# Patient Record
Sex: Female | Born: 1935 | Race: White | Hispanic: No | Smoking: Never smoker
Health system: Southern US, Community
[De-identification: ages and names within clinical notes are randomized; demographics above are authoritative.]

## PROBLEM LIST (undated history)

## (undated) DIAGNOSIS — I4891 Unspecified atrial fibrillation: Secondary | ICD-10-CM

## (undated) DIAGNOSIS — F419 Anxiety disorder, unspecified: Secondary | ICD-10-CM

## (undated) DIAGNOSIS — E785 Hyperlipidemia, unspecified: Secondary | ICD-10-CM

## (undated) DIAGNOSIS — F0390 Unspecified dementia without behavioral disturbance: Secondary | ICD-10-CM

## (undated) DIAGNOSIS — I10 Essential (primary) hypertension: Secondary | ICD-10-CM

## (undated) DIAGNOSIS — J45909 Unspecified asthma, uncomplicated: Secondary | ICD-10-CM

## (undated) HISTORY — PX: BACK SURGERY: SHX140

## (undated) HISTORY — PX: JOINT REPLACEMENT: SHX530

## (undated) HISTORY — PX: ABDOMINAL HYSTERECTOMY: SHX81

---

## 2018-10-25 ENCOUNTER — Inpatient Hospital Stay (HOSPITAL_COMMUNITY)
Admission: EM | Admit: 2018-10-25 | Discharge: 2018-11-01 | DRG: 291 | Disposition: A | Discharge status: Nursing Home (Includes ICF) | Payer: Medicare Other | Attending: Internal Medicine | Admitting: Internal Medicine

## 2018-10-25 ENCOUNTER — Encounter (HOSPITAL_COMMUNITY): Payer: Self-pay

## 2018-10-25 ENCOUNTER — Emergency Department (HOSPITAL_COMMUNITY): Payer: Medicare Other

## 2018-10-25 ENCOUNTER — Other Ambulatory Visit: Payer: Self-pay

## 2018-10-25 ENCOUNTER — Observation Stay (HOSPITAL_BASED_OUTPATIENT_CLINIC_OR_DEPARTMENT_OTHER): Payer: Medicare Other

## 2018-10-25 DIAGNOSIS — R0602 Shortness of breath: Secondary | ICD-10-CM

## 2018-10-25 DIAGNOSIS — J9811 Atelectasis: Secondary | ICD-10-CM | POA: Diagnosis present

## 2018-10-25 DIAGNOSIS — I4819 Other persistent atrial fibrillation: Secondary | ICD-10-CM | POA: Diagnosis present

## 2018-10-25 DIAGNOSIS — I272 Pulmonary hypertension, unspecified: Secondary | ICD-10-CM | POA: Diagnosis present

## 2018-10-25 DIAGNOSIS — Z7901 Long term (current) use of anticoagulants: Secondary | ICD-10-CM

## 2018-10-25 DIAGNOSIS — E871 Hypo-osmolality and hyponatremia: Secondary | ICD-10-CM | POA: Diagnosis present

## 2018-10-25 DIAGNOSIS — I429 Cardiomyopathy, unspecified: Secondary | ICD-10-CM | POA: Diagnosis present

## 2018-10-25 DIAGNOSIS — N183 Chronic kidney disease, stage 3 unspecified: Secondary | ICD-10-CM

## 2018-10-25 DIAGNOSIS — N179 Acute kidney failure, unspecified: Secondary | ICD-10-CM | POA: Diagnosis present

## 2018-10-25 DIAGNOSIS — Z66 Do not resuscitate: Secondary | ICD-10-CM | POA: Diagnosis present

## 2018-10-25 DIAGNOSIS — I110 Hypertensive heart disease with heart failure: Principal | ICD-10-CM | POA: Diagnosis present

## 2018-10-25 DIAGNOSIS — Z6832 Body mass index (BMI) 32.0-32.9, adult: Secondary | ICD-10-CM

## 2018-10-25 DIAGNOSIS — I350 Nonrheumatic aortic (valve) stenosis: Secondary | ICD-10-CM | POA: Diagnosis not present

## 2018-10-25 DIAGNOSIS — I5043 Acute on chronic combined systolic (congestive) and diastolic (congestive) heart failure: Secondary | ICD-10-CM | POA: Diagnosis present

## 2018-10-25 DIAGNOSIS — I252 Old myocardial infarction: Secondary | ICD-10-CM

## 2018-10-25 DIAGNOSIS — I361 Nonrheumatic tricuspid (valve) insufficiency: Secondary | ICD-10-CM

## 2018-10-25 DIAGNOSIS — Z90710 Acquired absence of both cervix and uterus: Secondary | ICD-10-CM

## 2018-10-25 DIAGNOSIS — E785 Hyperlipidemia, unspecified: Secondary | ICD-10-CM | POA: Diagnosis present

## 2018-10-25 DIAGNOSIS — Z79899 Other long term (current) drug therapy: Secondary | ICD-10-CM

## 2018-10-25 DIAGNOSIS — I509 Heart failure, unspecified: Secondary | ICD-10-CM | POA: Diagnosis not present

## 2018-10-25 DIAGNOSIS — Z888 Allergy status to other drugs, medicaments and biological substances status: Secondary | ICD-10-CM

## 2018-10-25 DIAGNOSIS — F0390 Unspecified dementia without behavioral disturbance: Secondary | ICD-10-CM

## 2018-10-25 DIAGNOSIS — I480 Paroxysmal atrial fibrillation: Secondary | ICD-10-CM

## 2018-10-25 DIAGNOSIS — N390 Urinary tract infection, site not specified: Secondary | ICD-10-CM | POA: Diagnosis present

## 2018-10-25 DIAGNOSIS — J9601 Acute respiratory failure with hypoxia: Secondary | ICD-10-CM | POA: Diagnosis present

## 2018-10-25 DIAGNOSIS — I10 Essential (primary) hypertension: Secondary | ICD-10-CM

## 2018-10-25 DIAGNOSIS — I4891 Unspecified atrial fibrillation: Secondary | ICD-10-CM

## 2018-10-25 DIAGNOSIS — Z8249 Family history of ischemic heart disease and other diseases of the circulatory system: Secondary | ICD-10-CM

## 2018-10-25 DIAGNOSIS — E669 Obesity, unspecified: Secondary | ICD-10-CM | POA: Diagnosis present

## 2018-10-25 DIAGNOSIS — B9620 Unspecified Escherichia coli [E. coli] as the cause of diseases classified elsewhere: Secondary | ICD-10-CM | POA: Diagnosis present

## 2018-10-25 DIAGNOSIS — I083 Combined rheumatic disorders of mitral, aortic and tricuspid valves: Secondary | ICD-10-CM | POA: Diagnosis present

## 2018-10-25 HISTORY — DX: Unspecified atrial fibrillation: I48.91

## 2018-10-25 HISTORY — DX: Anxiety disorder, unspecified: F41.9

## 2018-10-25 HISTORY — DX: Essential (primary) hypertension: I10

## 2018-10-25 HISTORY — DX: Unspecified dementia, unspecified severity, without behavioral disturbance, psychotic disturbance, mood disturbance, and anxiety: F03.90

## 2018-10-25 HISTORY — DX: Hyperlipidemia, unspecified: E78.5

## 2018-10-25 LAB — ECHOCARDIOGRAM COMPLETE
Height: 59 in
WEIGHTICAEL: 2536.17 [oz_av]

## 2018-10-25 LAB — COMPREHENSIVE METABOLIC PANEL
ALBUMIN: 3.6 g/dL (ref 3.5–5.0)
ALT: 30 U/L (ref 0–44)
AST: 32 U/L (ref 15–41)
Alkaline Phosphatase: 162 U/L — ABNORMAL HIGH (ref 38–126)
Anion gap: 12 (ref 5–15)
BILIRUBIN TOTAL: 1 mg/dL (ref 0.3–1.2)
BUN: 16 mg/dL (ref 8–23)
CHLORIDE: 98 mmol/L (ref 98–111)
CO2: 22 mmol/L (ref 22–32)
Calcium: 9 mg/dL (ref 8.9–10.3)
Creatinine, Ser: 1.01 mg/dL — ABNORMAL HIGH (ref 0.44–1.00)
GFR calc Af Amer: 57 mL/min — ABNORMAL LOW (ref >60)
GFR calc non Af Amer: 49 mL/min — ABNORMAL LOW (ref >60)
GLUCOSE: 109 mg/dL — AB (ref 70–99)
POTASSIUM: 4.5 mmol/L (ref 3.5–5.1)
SODIUM: 132 mmol/L — AB (ref 135–145)
Total Protein: 6.6 g/dL (ref 6.5–8.1)

## 2018-10-25 LAB — CBC WITH DIFFERENTIAL/PLATELET
Abs Immature Granulocytes: 0.05 10*3/uL (ref 0.00–0.07)
BASOS ABS: 0.1 10*3/uL (ref 0.0–0.1)
Basophils Relative: 1 %
EOS PCT: 1 %
Eosinophils Absolute: 0.1 10*3/uL (ref 0.0–0.5)
HEMATOCRIT: 38.5 % (ref 36.0–46.0)
Hemoglobin: 12.2 g/dL (ref 12.0–15.0)
IMMATURE GRANULOCYTES: 0 %
Lymphocytes Relative: 13 %
Lymphs Abs: 1.6 10*3/uL (ref 0.7–4.0)
MCH: 28.7 pg (ref 26.0–34.0)
MCHC: 31.7 g/dL (ref 30.0–36.0)
MCV: 90.6 fL (ref 80.0–100.0)
MONO ABS: 1 10*3/uL (ref 0.1–1.0)
MONOS PCT: 8 %
Neutro Abs: 9.9 10*3/uL — ABNORMAL HIGH (ref 1.7–7.7)
Neutrophils Relative %: 77 %
PLATELETS: 350 10*3/uL (ref 150–400)
RBC: 4.25 MIL/uL (ref 3.87–5.11)
RDW: 16.6 % — AB (ref 11.5–15.5)
WBC: 12.7 10*3/uL — ABNORMAL HIGH (ref 4.0–10.5)
nRBC: 0 % (ref 0.0–0.2)

## 2018-10-25 LAB — URINALYSIS, ROUTINE W REFLEX MICROSCOPIC
Bilirubin Urine: NEGATIVE
Glucose, UA: NEGATIVE mg/dL
KETONES UR: NEGATIVE mg/dL
NITRITE: POSITIVE — AB
Protein, ur: NEGATIVE mg/dL
Specific Gravity, Urine: 1.009 (ref 1.005–1.030)
pH: 5 (ref 5.0–8.0)

## 2018-10-25 LAB — MRSA PCR SCREENING: MRSA by PCR: NEGATIVE

## 2018-10-25 LAB — BRAIN NATRIURETIC PEPTIDE: B NATRIURETIC PEPTIDE 5: 1010.4 pg/mL — AB (ref 0.0–100.0)

## 2018-10-25 LAB — I-STAT TROPONIN, ED: Troponin i, poc: 0 ng/mL (ref 0.00–0.08)

## 2018-10-25 LAB — TROPONIN I: Troponin I: <0.03 ng/mL (ref <0.03)

## 2018-10-25 MED ORDER — METOPROLOL TARTRATE 5 MG/5ML IV SOLN: 5.0000 mg | Freq: Once | INTRAVENOUS | Status: DC

## 2018-10-25 MED ORDER — DILTIAZEM HCL-DEXTROSE 100-5 MG/100ML-% IV SOLN (PREMIX)
5.0000 mg/h | INTRAVENOUS | Status: DC
  Administered 2018-10-25: 5 mg/h via INTRAVENOUS
  Administered 2018-10-26: 10 mg/h via INTRAVENOUS
  Filled 2018-10-25 (×4): qty 100

## 2018-10-25 MED ORDER — SODIUM CHLORIDE 0.9 % IV SOLN: 250.0000 mL | INTRAVENOUS | Status: DC | PRN

## 2018-10-25 MED ORDER — SODIUM CHLORIDE 0.9% FLUSH: 3.0000 mL | INTRAVENOUS | Status: DC | PRN

## 2018-10-25 MED ORDER — SODIUM CHLORIDE 0.9% FLUSH
3.0000 mL | Freq: Two times a day (BID) | INTRAVENOUS | Status: DC
  Administered 2018-10-25 – 2018-11-01 (×15): 3 mL via INTRAVENOUS

## 2018-10-25 MED ORDER — TRAMADOL HCL 50 MG PO TABS
50.0000 mg | ORAL_TABLET | Freq: Two times a day (BID) | ORAL | Status: DC | PRN
  Administered 2018-10-25 – 2018-10-31 (×4): 50 mg via ORAL
  Filled 2018-10-25 (×4): qty 1

## 2018-10-25 MED ORDER — APIXABAN 5 MG PO TABS
5.0000 mg | ORAL_TABLET | Freq: Two times a day (BID) | ORAL | Status: DC
  Administered 2018-10-25 – 2018-11-01 (×15): 5 mg via ORAL
  Filled 2018-10-25 (×15): qty 1

## 2018-10-25 MED ORDER — ONDANSETRON HCL 4 MG/2ML IJ SOLN: 4.0000 mg | Freq: Four times a day (QID) | INTRAMUSCULAR | Status: DC | PRN

## 2018-10-25 MED ORDER — METOPROLOL TARTRATE 5 MG/5ML IV SOLN
2.5000 mg | INTRAVENOUS | Status: DC | PRN
  Filled 2018-10-25: qty 5

## 2018-10-25 MED ORDER — HYDRALAZINE HCL 20 MG/ML IJ SOLN
5.0000 mg | Freq: Four times a day (QID) | INTRAMUSCULAR | Status: DC | PRN
  Administered 2018-10-25: 5 mg via INTRAVENOUS
  Filled 2018-10-25: qty 1

## 2018-10-25 MED ORDER — METOPROLOL TARTRATE 5 MG/5ML IV SOLN
5.0000 mg | Freq: Once | INTRAVENOUS | Status: AC
  Administered 2018-10-25: 5 mg via INTRAVENOUS
  Filled 2018-10-25: qty 5

## 2018-10-25 MED ORDER — DRONEDARONE HCL 400 MG PO TABS
400.0000 mg | ORAL_TABLET | Freq: Two times a day (BID) | ORAL | Status: DC
  Administered 2018-10-25 – 2018-10-26 (×2): 400 mg via ORAL
  Filled 2018-10-25 (×2): qty 1

## 2018-10-25 MED ORDER — FUROSEMIDE 10 MG/ML IJ SOLN
40.0000 mg | Freq: Once | INTRAMUSCULAR | Status: AC
  Administered 2018-10-25: 40 mg via INTRAVENOUS
  Filled 2018-10-25: qty 4

## 2018-10-25 MED ORDER — GABAPENTIN 100 MG PO CAPS
100.0000 mg | ORAL_CAPSULE | Freq: Two times a day (BID) | ORAL | Status: DC
  Administered 2018-10-25 – 2018-11-01 (×14): 100 mg via ORAL
  Filled 2018-10-25 (×15): qty 1

## 2018-10-25 MED ORDER — SODIUM CHLORIDE 0.9 % IV SOLN
1.0000 g | INTRAVENOUS | Status: DC
  Administered 2018-10-25 – 2018-10-26 (×2): 1 g via INTRAVENOUS
  Filled 2018-10-25 (×2): qty 1

## 2018-10-25 MED ORDER — ALBUTEROL SULFATE (2.5 MG/3ML) 0.083% IN NEBU
5.0000 mg | INHALATION_SOLUTION | Freq: Once | RESPIRATORY_TRACT | Status: AC
  Administered 2018-10-25: 5 mg via RESPIRATORY_TRACT
  Filled 2018-10-25: qty 6

## 2018-10-25 MED ORDER — ACETAMINOPHEN 325 MG PO TABS
650.0000 mg | ORAL_TABLET | Freq: Four times a day (QID) | ORAL | Status: DC | PRN
  Administered 2018-10-31: 650 mg via ORAL
  Filled 2018-10-25: qty 2

## 2018-10-25 MED ORDER — FUROSEMIDE 10 MG/ML IJ SOLN
40.0000 mg | Freq: Two times a day (BID) | INTRAMUSCULAR | Status: DC
  Administered 2018-10-25 – 2018-10-27 (×4): 40 mg via INTRAVENOUS
  Filled 2018-10-25 (×4): qty 4

## 2018-10-25 MED ORDER — CHLORHEXIDINE GLUCONATE 0.12 % MT SOLN
15.0000 mL | Freq: Two times a day (BID) | OROMUCOSAL | Status: DC
  Administered 2018-10-26 – 2018-11-01 (×12): 15 mL via OROMUCOSAL
  Filled 2018-10-25 (×11): qty 15

## 2018-10-25 MED ORDER — ORAL CARE MOUTH RINSE
15.0000 mL | Freq: Two times a day (BID) | OROMUCOSAL | Status: DC
  Administered 2018-10-25 – 2018-10-31 (×7): 15 mL via OROMUCOSAL

## 2018-10-25 MED ORDER — POTASSIUM CHLORIDE CRYS ER 20 MEQ PO TBCR
20.0000 meq | EXTENDED_RELEASE_TABLET | Freq: Two times a day (BID) | ORAL | Status: DC
  Administered 2018-10-25 – 2018-11-01 (×14): 20 meq via ORAL
  Filled 2018-10-25 (×14): qty 1

## 2018-10-25 NOTE — ED Provider Notes (Signed)
[facility] DEPT Provider Note   CSN: [id number] Arrival date & time: 10/25/18  0825     History   Chief Complaint Chief Complaint  Patient presents with  . Shortness of Breath    HPI Nicole Barker is a 84 y.o. female.  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  Has had CHF per daughter, had cardiac MRI  [facility] physicians, not sure name of Cardiologist, had cardiac MRI  Daughter Emmaline Life, reports she moved 1 year ago, and in August moved to assisted living  Past Medical History:  Diagnosis Date  . Anxiety   . Atrial fibrillation ([place])   . Dementia ([place])   . Hyperlipidemia   . Hypertension     Patient Active Problem List   Diagnosis Date Noted  . Acute heart failure ([place]) 10/25/2018  . Atrial fibrillation with RVR ([place]) 10/25/2018  . Dementia without behavioral disturbance ([place]) 10/25/2018  . Essential hypertension 10/25/2018  . Hyperlipidemia 10/25/2018  . AKI (acute kidney injury) ([place]) 10/25/2018    Past Surgical History:  Procedure Laterality Date  . ABDOMINAL HYSTERECTOMY    . BACK SURGERY    . JOINT REPLACEMENT     knee     OB History   None      Home Medications    Prior to Admission medications   Medication Sig Start Date End Date Taking? Authorizing Provider  acetaminophen (TYLENOL) 500 MG tablet Take 1,000 mg by mouth 3 (three) times daily.   Yes [provider]  apixaban (ELIQUIS) 5 MG TABS tablet Take 5 mg by mouth 2 (two) times daily.   Yes [provider]  dronedarone (MULTAQ) 400 MG tablet Take 400 mg by mouth 2 (two) times daily with a meal.   Yes [provider]  furosemide (LASIX) 20 MG tablet Take 20 mg by mouth daily.   Yes [provider]  gabapentin (NEURONTIN) 100 MG capsule Take 100 mg by mouth 2 (two) times daily.   Yes [provider]  losartan (COZAAR) 100 MG tablet Take 100 mg by mouth daily.   Yes [provider]  Multiple Vitamins-Minerals (CENTRUM SILVER 50+WOMEN) TABS Take 1 tablet by mouth daily.   Yes [provider]  potassium chloride (K-DUR,KLOR-CON) 10 MEQ tablet Take 10 mEq by mouth daily.   Yes [provider]  traMADol (ULTRAM) 50 MG tablet Take 50 mg by mouth every 12 (twelve) hours as needed for moderate pain.   Yes [provider]    Family History History reviewed. No pertinent family history.  Social History Social History   Tobacco Use  . Smoking status: Never Smoker  . Smokeless tobacco: Never Used  Substance Use Topics  . Alcohol use: Never    Frequency: Never  . Drug use: Never     Allergies   Iodine   Review of Systems Review of Systems  Constitutional: Negative for fever.  HENT: Negative for sore throat.   Eyes: Negative for visual disturbance.  Respiratory: Positive for cough, shortness of  breath and wheezing.   Cardiovascular: Negative for chest pain.  Gastrointestinal: Negative for abdominal pain, nausea and vomiting.  Genitourinary: Negative for difficulty urinating.  Musculoskeletal: Positive for back pain. Negative for neck pain.  Skin: Negative for rash.  Neurological: Negative for syncope and headaches.     Physical Exam Updated Vital Signs BP (!) 152/117   Pulse (!) 113   Temp 98.4 F (36.9 C) (Oral)   Resp (!) 29   Ht 4\' 11"  (1.499 m)   Wt 71.9 kg   SpO2 95%   BMI 32.02 kg/m   Physical Exam  Constitutional: She is oriented to person, place, and time. She appears well-developed and  well-nourished. No distress.  HENT:  Head: Normocephalic and atraumatic.  Eyes: Conjunctivae and EOM are normal.  Neck: Normal range of motion.  Cardiovascular: Normal heart sounds and intact distal pulses. An irregularly irregular rhythm present. Tachycardia present. Exam reveals no gallop and no friction rub.  No murmur heard. Pulmonary/Chest: Effort normal. No respiratory distress. She has no wheezes. She has rales (bibasilar).  Abdominal: Soft. She exhibits no distension. There is no tenderness. There is no guarding.  Musculoskeletal: She exhibits no tenderness.       Right lower leg: She exhibits edema.       Left lower leg: She exhibits edema.  Neurological: She is alert and oriented to person, place, and time.  Skin: Skin is warm and dry. No rash noted. She is not diaphoretic. No erythema.  Nursing note and vitals reviewed.    ED Treatments / Results  Labs (all labs ordered are listed, but only abnormal results are displayed) Labs Reviewed  CBC WITH DIFFERENTIAL/PLATELET - Abnormal; Notable for the following components:      Result Value   WBC 12.7 (*)    RDW 16.6 (*)    Neutro Abs 9.9 (*)    All other components within normal limits  COMPREHENSIVE METABOLIC PANEL - Abnormal; Notable for the following components:   Sodium 132 (*)    Glucose, Bld 109 (*)    Creatinine, Ser 1.01 (*)    Alkaline Phosphatase 162 (*)    GFR calc non Af Amer 49 (*)    GFR calc Af Amer 57 (*)    All other components within normal limits  BRAIN NATRIURETIC PEPTIDE - Abnormal; Notable for the following components:   B Natriuretic Peptide 1,010.4 (*)    All other components within normal limits  URINALYSIS, ROUTINE W REFLEX MICROSCOPIC - Abnormal; Notable for the following components:   Hgb urine dipstick SMALL (*)    Nitrite POSITIVE (*)    Leukocytes, UA SMALL (*)    Bacteria, UA MANY (*)    All other components within normal limits  MRSA PCR SCREENING  URINE CULTURE  TROPONIN I  BASIC  METABOLIC PANEL  CBC  TROPONIN I  TROPONIN I  I-STAT TROPONIN, ED    EKG EKG Interpretation  Date/Time:  Tuesday October 25 2018 08:52:40 EST Ventricular Rate:  116 PR Interval:    QRS Duration: 78 QT Interval:  336 QTC Calculation: 467 R Axis:   72 Text Interpretation:  Atrial fibrillation Anterior infarct, old No previous ECGs available Confirmed by Gareth Morgan [phone]) on 10/25/2018 9:08:25 AM   Radiology Dg Chest 2 View  Result Date: 10/25/2018 CLINICAL DATA:  Shortness of breath. EXAM: CHEST - 2 VIEW COMPARISON:  None. FINDINGS: The heart is enlarged. Aortic atherosclerosis is present. A mild interstitial pattern present. Some of this is likely  chronic. Small effusions are present. Bibasilar airspace disease likely reflects atelectasis. IMPRESSION: 1. Cardiomegaly with mild edema and bilateral effusions suggesting congestive heart failure. 2. Bibasilar airspace disease likely reflects atelectasis, left greater than right. 3. Aortic atherosclerosis. Electronically Signed   By: San Morelle M.D.   On: 10/25/2018 09:27    Procedures .Critical Care Performed by: Gareth Morgan, MD Authorized by: Gareth Morgan, MD   Critical care provider statement:    Critical care time (minutes):  30   Critical care was necessary to treat or prevent imminent or life-threatening deterioration of the following conditions:  Circulatory failure   Critical care was time spent personally by me on the following activities:  Development of treatment plan with patient or surrogate, evaluation of patient's response to treatment, re-evaluation of patient's condition, ordering and review of radiographic studies and ordering and review of laboratory studies   (including critical care time)  Medications Ordered in ED Medications  traMADol (ULTRAM) tablet 50 mg (50 mg Oral Given 10/25/18 2103)  dronedarone (MULTAQ) tablet 400 mg (400 mg Oral Given 10/25/18 1754)  apixaban (ELIQUIS) tablet  5 mg (5 mg Oral Given 10/25/18 2103)  gabapentin (NEURONTIN) capsule 100 mg (100 mg Oral Given 10/25/18 2103)  sodium chloride flush (NS) 0.9 % injection 3 mL (3 mLs Intravenous Given 10/25/18 2103)  sodium chloride flush (NS) 0.9 % injection 3 mL (has no administration in time range)  0.9 %  sodium chloride infusion (has no administration in time range)  acetaminophen (TYLENOL) tablet 650 mg (has no administration in time range)  ondansetron (ZOFRAN) injection 4 mg (has no administration in time range)  furosemide (LASIX) injection 40 mg (40 mg Intravenous Given 10/25/18 1753)  potassium chloride SA (K-DUR,KLOR-CON) CR tablet 20 mEq (20 mEq Oral Given 10/25/18 1754)  chlorhexidine (PERIDEX) 0.12 % solution 15 mL (15 mLs Mouth Rinse Not Given 10/25/18 2104)  MEDLINE mouth rinse (15 mLs Mouth Rinse Given 10/25/18 1523)  hydrALAZINE (APRESOLINE) injection 5 mg (5 mg Intravenous Given 10/25/18 1754)  diltiazem (CARDIZEM) 100 mg in dextrose 5% 120mL (1 mg/mL) infusion (5 mg/hr Intravenous New Bag/Given 10/25/18 1827)  cefTRIAXone (ROCEPHIN) 1 g in sodium chloride 0.9 % 100 mL IVPB (has no administration in time range)  albuterol (PROVENTIL) (2.5 MG/3ML) 0.083% nebulizer solution 5 mg (5 mg Nebulization Given 10/25/18 0853)  metoprolol tartrate (LOPRESSOR) injection 5 mg (5 mg Intravenous Given 10/25/18 0944)  furosemide (LASIX) injection 40 mg (40 mg Intravenous Given 10/25/18 1050)     Initial Impression / Assessment and Plan / ED Course  I have reviewed the triage vital signs and the nursing notes.  Pertinent labs & imaging results that were available during my care of the patient were reviewed by me and considered in my medical decision making (see chart for details).     84 year old female with history of atrial fibrillation on Eliquis, dementia, hypertension, hyperlipidemia presents with concern for shortness of breath.  History is limited by patient's dementia.  Differential diagnosis for dyspnea  includes congestive heart failure, atrial fibrillation, pneumonia.  Have low suspicion for pulmonary embolus given patient on chronic anticoagulation.  Chest x-ray shows no evidence of volume overload.  Have low suspicion for pneumonia. Patient arrives with atrial fibrillation, rate to 120s-130s.  Afib with RVR possible contributor to CHF, volume overload. Given metoprolol with change in HR to 110s. Given lasix. Will admit for further care.     Final Clinical Impressions(s) / ED Diagnoses   Final diagnoses:  Congestive heart  failure, unspecified HF chronicity, unspecified heart failure type [facility])  Atrial fibrillation with RVR [facility])    ED Discharge Orders    None       Gareth Morgan, MD 10/25/18 2212

## 2018-10-25 NOTE — ED Notes (Addendum)
Pt states that she is also having lower back pain, and is concerned that she has a "kidney infection". Pt states that she frequently has them. Pt will notify this RN when she needs to void.

## 2018-10-25 NOTE — Progress Notes (Signed)
ED TO INPATIENT HANDOFF REPORT  Name/Age/Gender Nicole Barker 84 y.o. female  Code Status Advance Directive Documentation     Most Recent Value  Type of Advance Directive  Out of facility DNR (pink MOST or yellow form), Healthcare Power of Attorney  Pre-existing out of facility DNR order (yellow form or pink MOST form)  -  "MOST" Form in Place?  -      Home/SNF/Other Home  Chief Complaint diff breathing  Level of Care/Admitting Diagnosis ED Disposition    ED Disposition Condition [place]: Nicole Barker [[id number]]  Level of Care: Stepdown [14]  Admit to SDU based on following criteria: Cardiac Instability:  Patients experiencing chest pain, unconfirmed MI and stable, arrhythmias and CHF requiring medical management and potentially compromising patient's stability  Diagnosis: Acute heart failure [facility]) [701410]  Admitting Physician: Mariel Aloe [phone]  Attending Physician: Mariel Aloe [phone]  PT Class (Do Not Modify): Observation [104]  PT Acc Code (Do Not Modify): Observation [10022]       Medical History Past Medical History:  Diagnosis Date  . Anxiety   . Atrial fibrillation ([place])   . Dementia ([place])   . Hyperlipidemia   . Hypertension     Allergies Allergies  Allergen Reactions  . Iodine Other (See Comments)    Unknown    IV Location/Drains/Wounds Patient Lines/Drains/Airways Status   Active Line/Drains/Airways    Name:   Placement date:   Placement time:   Site:   Days:   Peripheral IV 10/25/18 Left Antecubital   10/25/18    0942    Antecubital   less than 1   External Urinary Catheter   10/25/18    1051    -   less than 1          Labs/Imaging Results for orders placed or performed during the hospital encounter of 10/25/18 (from the past 48 hour(s))  CBC with Differential     Status: Abnormal   Collection Time: 10/25/18  9:43 AM  Result Value Ref Range   WBC 12.7 (H) 4.0 - 10.5 K/uL   RBC 4.25  3.87 - 5.11 MIL/uL   Hemoglobin 12.2 12.0 - 15.0 g/dL   HCT 38.5 36.0 - 46.0 %   MCV 90.6 80.0 - 100.0 fL   MCH 28.7 26.0 - 34.0 pg   MCHC 31.7 30.0 - 36.0 g/dL   RDW 16.6 (H) 11.5 - 15.5 %   Platelets 350 150 - 400 K/uL   nRBC 0.0 0.0 - 0.2 %   Neutrophils Relative % 77 %   Neutro Abs 9.9 (H) 1.7 - 7.7 K/uL   Lymphocytes Relative 13 %   Lymphs Abs 1.6 0.7 - 4.0 K/uL   Monocytes Relative 8 %   Monocytes Absolute 1.0 0.1 - 1.0 K/uL   Eosinophils Relative 1 %   Eosinophils Absolute 0.1 0.0 - 0.5 K/uL   Basophils Relative 1 %   Basophils Absolute 0.1 0.0 - 0.1 K/uL   Immature Granulocytes 0 %   Abs Immature Granulocytes 0.05 0.00 - 0.07 K/uL    Comment: Performed at [facility], [place] [address] [place], [place] [address]  Comprehensive metabolic panel     Status: Abnormal   Collection Time: 10/25/18  9:43 AM  Result Value Ref Range   Sodium 132 (L) 135 - 145 mmol/L   Potassium 4.5 3.5 - 5.1 mmol/L   Chloride 98 98 - 111 mmol/L   CO2  22 22 - 32 mmol/L   Glucose, Bld 109 (H) 70 - 99 mg/dL   BUN 16 8 - 23 mg/dL   Creatinine, Ser 1.01 (H) 0.44 - 1.00 mg/dL   Calcium 9.0 8.9 - 10.3 mg/dL   Total Protein 6.6 6.5 - 8.1 g/dL   Albumin 3.6 3.5 - 5.0 g/dL   AST 32 15 - 41 U/L   ALT 30 0 - 44 U/L   Alkaline Phosphatase 162 (H) 38 - 126 U/L   Total Bilirubin 1.0 0.3 - 1.2 mg/dL   GFR calc non Af Amer 49 (L) >60 mL/min   GFR calc Af Amer 57 (L) >60 mL/min    Comment: (NOTE) The eGFR has been calculated using the CKD EPI equation. This calculation has not been validated in all clinical situations. eGFR's persistently <60 mL/min signify possible Chronic Kidney Disease.    Anion gap 12 5 - 15    Comment: Performed at [facility], [place] [address], [place], [place] [address]  Brain natriuretic peptide     Status: Abnormal   Collection Time: 10/25/18  9:43 AM  Result Value Ref Range   B Natriuretic Peptide 1,010.4 (H) 0.0 - 100.0 pg/mL    Comment:  Performed at [facility], [place] [address], [place], [place] [address]  I-Stat Troponin, ED (not at [facility])     Status: None   Collection Time: 10/25/18  9:49 AM  Result Value Ref Range   Troponin i, poc 0.00 0.00 - 0.08 ng/mL   Comment 3            Comment: Due to the release kinetics of cTnI, a negative result within the first hours of the onset of symptoms does not rule out myocardial infarction with certainty. If myocardial infarction is still suspected, repeat the test at appropriate intervals.    Dg Chest 2 View  Result Date: 10/25/2018 CLINICAL DATA:  Shortness of breath. EXAM: CHEST - 2 VIEW COMPARISON:  None. FINDINGS: The heart is enlarged. Aortic atherosclerosis is present. A mild interstitial pattern present. Some of this is likely chronic. Small effusions are present. Bibasilar airspace disease likely reflects atelectasis. IMPRESSION: 1. Cardiomegaly with mild edema and bilateral effusions suggesting congestive heart failure. 2. Bibasilar airspace disease likely reflects atelectasis, left greater than right. 3. Aortic atherosclerosis. Electronically Signed   By: [place] Morelle M.D.   On: 10/25/2018 09:27    Pending Labs Unresulted Labs (From admission, onward)    Start     Ordered   10/25/18 1148  Urinalysis, Routine w reflex microscopic  Once,   R     10/25/18 1147   10/25/18 1148  Culture, Urine  Once,   R     10/25/18 1147   Signed and Held  Basic metabolic panel  Daily,   R     Signed and Held   Signed and Held  CBC  Tomorrow morning,   R     Signed and Held          Vitals/Pain Today's Vitals   10/25/18 1031 10/25/18 1100 10/25/18 1101 10/25/18 1130  BP: (!) 149/88  (!) 185/93   Pulse: 92 97 (!) 105 (!) 104  Resp: (!) 31 (!) 23 (!) 29 (!) 28  Temp:      TempSrc:      SpO2: 96% 96% 96% 93%  Weight:      Height:      PainSc:        Isolation  Precautions No active isolations  Medications Medications  metoprolol tartrate  (LOPRESSOR) injection 2.5 mg (has no administration in time range)  albuterol (PROVENTIL) (2.5 MG/3ML) 0.083% nebulizer solution 5 mg (5 mg Nebulization Given 10/25/18 0853)  metoprolol tartrate (LOPRESSOR) injection 5 mg (5 mg Intravenous Given 10/25/18 0944)  furosemide (LASIX) injection 40 mg (40 mg Intravenous Given 10/25/18 1050)    Mobility walks

## 2018-10-25 NOTE — H&P (Signed)
History and Physical    Nicole Barker TKZ:[id number] DOB: 1930-09-04 DOA: 10/25/2018  PCP: Joya San, MD Patient coming from: [facility]  Chief Complaint: Dyspnea  HPI: Nicole Barker is a 84 y.o. female with medical history significant of heart failure, atrial fibrillation, dementia, hypertension, hyperlipidemia. Patient unable to provide history. She is unsure of why she is here. She does state that she is having shortness of breath of unknown time frame. She also has some frequency.  ED Course: Vitals: Afebrile, tachycardia, tachypnea, hypertensive, on room air Labs: sodium of 132, creatinine of 1.01, alkaline phosphatase of 162, BNP of 1010, WBC of 12.7 Imaging: Chest x-ray significant for cardiomegaly, edema, bilateral effusion, atelectasis Medications/Course: Albuterol, lasix, lopressor, given  Review of Systems: Review of Systems  Constitutional: Negative for chills and fever.  Respiratory: Positive for shortness of breath. Negative for cough, sputum production and wheezing.   Cardiovascular: Negative for chest pain.  Gastrointestinal: Positive for abdominal pain (suprapubic). Negative for constipation, diarrhea, nausea and vomiting.  Genitourinary: Positive for frequency. Negative for dysuria.  All other systems reviewed and are negative.   Past Medical History:  Diagnosis Date  . Anxiety   . Atrial fibrillation ([place])   . Dementia ([place])   . Hyperlipidemia   . Hypertension     Past Surgical History:  Procedure Laterality Date  . ABDOMINAL HYSTERECTOMY    . BACK SURGERY    . JOINT REPLACEMENT     knee     reports that she has never smoked. She has never used smokeless tobacco. She reports that she does not drink alcohol or use drugs.  Allergies  Allergen Reactions  . Iodine Other (See Comments)    Unknown    History reviewed. No pertinent family history.  Prior to Admission medications   Medication Sig Start Date End Date Taking?  Authorizing Provider  acetaminophen (TYLENOL) 500 MG tablet Take 1,000 mg by mouth 3 (three) times daily.   Yes [provider]  apixaban (ELIQUIS) 5 MG TABS tablet Take 5 mg by mouth 2 (two) times daily.   Yes [provider]  dronedarone (MULTAQ) 400 MG tablet Take 400 mg by mouth 2 (two) times daily with a meal.   Yes [provider]  furosemide (LASIX) 20 MG tablet Take 20 mg by mouth daily.   Yes [provider]  gabapentin (NEURONTIN) 100 MG capsule Take 100 mg by mouth 2 (two) times daily.   Yes [provider]  losartan (COZAAR) 100 MG tablet Take 100 mg by mouth daily.   Yes [provider]  Multiple Vitamins-Minerals (CENTRUM SILVER 50+WOMEN) TABS Take 1 tablet by mouth daily.   Yes [provider]  potassium chloride (K-DUR,KLOR-CON) 10 MEQ tablet Take 10 mEq by mouth daily.   Yes [provider]  traMADol (ULTRAM) 50 MG tablet Take 50 mg by mouth every 12 (twelve) hours as needed for moderate pain.   Yes [provider]    Physical Exam:  Physical Exam  Constitutional: She is oriented to person, place, and time. She appears well-developed and well-nourished. No distress.  HENT:  Mouth/Throat: Oropharynx is clear and moist.  Eyes: Pupils are equal, round, and reactive to light. Conjunctivae and EOM are normal.  Neck: Normal range of motion. No JVD present.  Cardiovascular: Normal rate, regular rhythm and normal heart sounds.  No murmur heard. Pulmonary/Chest: Effort normal. No respiratory distress. She has decreased breath sounds in the right middle field, the right lower field, the  left middle field and the left lower field. She has no wheezes. She has no rhonchi. She has no rales.  Abdominal: Soft. Bowel sounds are normal. She exhibits no distension. There is tenderness (suprapubic). There is no rebound and no guarding.  Musculoskeletal: Normal range of motion. She exhibits no tenderness.        Right lower leg: She exhibits edema.       Left lower leg: She exhibits edema.  Lymphadenopathy:    She has no cervical adenopathy.  Neurological: She is alert and oriented to person, place, and time.  Skin: Skin is warm and dry. She is not diaphoretic.  Psychiatric: She has a normal mood and affect.  Vitals reviewed.   Labs on Admission: I have personally reviewed following labs and imaging studies  CBC: Recent Labs  Lab 10/25/18 0943  WBC 12.7*  NEUTROABS 9.9*  HGB 12.2  HCT 38.5  MCV 90.6  PLT 008    Basic Metabolic Panel: Recent Labs  Lab 10/25/18 0943  NA 132*  K 4.5  CL 98  CO2 22  GLUCOSE 109*  BUN 16  CREATININE 1.01*  CALCIUM 9.0    GFR: Estimated Creatinine Clearance: 32.9 mL/min (A) (by C-G formula based on SCr of 1.01 mg/dL (H)).  Liver Function Tests: Recent Labs  Lab 10/25/18 0943  AST 32  ALT 30  ALKPHOS 162*  BILITOT 1.0  PROT 6.6  ALBUMIN 3.6     Radiological Exams on Admission: Dg Chest 2 View  Result Date: 10/25/2018 CLINICAL DATA:  Shortness of breath. EXAM: CHEST - 2 VIEW COMPARISON:  None. FINDINGS: The heart is enlarged. Aortic atherosclerosis is present. A mild interstitial pattern present. Some of this is likely chronic. Small effusions are present. Bibasilar airspace disease likely reflects atelectasis. IMPRESSION: 1. Cardiomegaly with mild edema and bilateral effusions suggesting congestive heart failure. 2. Bibasilar airspace disease likely reflects atelectasis, left greater than right. 3. Aortic atherosclerosis. Electronically Signed   By: [place] Morelle M.D.   On: 10/25/2018 09:27    EKG: Independently reviewed. Atrial fibrillation, tachycardia  Assessment/Plan Principal Problem:   Acute heart failure (HCC) Active Problems:   Atrial fibrillation with RVR (HCC)   Dementia without behavioral disturbance (HCC)   Essential hypertension   Hyperlipidemia   Acute heart failure Unknown type. Previous history. On  lasix, losartan and potassium as an outpatient. Troponin negative in ED. No chest pain. Weight on admission of 145 lbs Lasix 40 mg IV BID with potassium supplementation -Strict in and out/daily weights -Watch kidney function -Transthoracic Echocardiogram -Oxygen as needed to keep O2 >94% -Hold losartan until creatinine improves or appears stable  Atrial fibrillation with RVR Improved with metoprolol in ED. Possibly contributed to acute heart failure -Continue dronedarone and Eliquis -Telemetry  Acute kidney injury No baseline. Likely in setting of heart failure. CrCl of 32.9 -Lasix -BMP in AM -Adjust medications for decreased function  Urinary frequency -Urinalysis and urine culture -Depending on urinalysis, will treat empirically with ceftriaxone  Dementia Does not appear to be on medications. Currently resides at [facility]. -Social work consult  Essential hypertension Uncontrolled in setting of fluid overload. -Hold losartan -Lasix as above   DVT prophylaxis: Eliquis Code Status: DNR Family Communication: None at bedside Disposition Plan: Stepdown Consults called: None Admission status: Observation   Cordelia Poche, MD [facility] 10/25/2018, 11:48 AM  If 7PM-7AM, please contact night-coverage [email or web address] Password [email or web address]

## 2018-10-25 NOTE — ED Triage Notes (Signed)
Pt BIBA from [place]. Pt moved here from [facility] 2 weeks ago. Pt has hx of asthma, but was not documented. Pt typically takes albuterol daily, but has not been given for 2 weeks. Pt has expiratory cough. Pt has hx of pneumonia, and feels like it is coming on. Pt also reports some anxiety, due to familial concerns.

## 2018-10-25 NOTE — ED Notes (Signed)
Pt reports some improvement after albuterol tx. Pt being transported to XR at this time.

## 2018-10-25 NOTE — ED Notes (Signed)
Bed: KI21 Expected date:  Expected time:  Means of arrival:  Comments: EMS- 84yo F, cough/out of meds

## 2018-10-25 NOTE — Progress Notes (Signed)
  Echocardiogram 2D Echocardiogram has been performed.  Nicole Barker M 10/25/2018, 2:02 PM

## 2018-10-26 ENCOUNTER — Encounter (HOSPITAL_COMMUNITY): Payer: Self-pay | Admitting: Cardiology

## 2018-10-26 DIAGNOSIS — J9811 Atelectasis: Secondary | ICD-10-CM | POA: Diagnosis present

## 2018-10-26 DIAGNOSIS — J9601 Acute respiratory failure with hypoxia: Secondary | ICD-10-CM

## 2018-10-26 DIAGNOSIS — Z90710 Acquired absence of both cervix and uterus: Secondary | ICD-10-CM | POA: Diagnosis not present

## 2018-10-26 DIAGNOSIS — I5041 Acute combined systolic (congestive) and diastolic (congestive) heart failure: Secondary | ICD-10-CM

## 2018-10-26 DIAGNOSIS — I5043 Acute on chronic combined systolic (congestive) and diastolic (congestive) heart failure: Secondary | ICD-10-CM | POA: Diagnosis present

## 2018-10-26 DIAGNOSIS — Z8249 Family history of ischemic heart disease and other diseases of the circulatory system: Secondary | ICD-10-CM | POA: Diagnosis not present

## 2018-10-26 DIAGNOSIS — I429 Cardiomyopathy, unspecified: Secondary | ICD-10-CM | POA: Diagnosis present

## 2018-10-26 DIAGNOSIS — Z66 Do not resuscitate: Secondary | ICD-10-CM | POA: Diagnosis present

## 2018-10-26 DIAGNOSIS — Z6832 Body mass index (BMI) 32.0-32.9, adult: Secondary | ICD-10-CM | POA: Diagnosis not present

## 2018-10-26 DIAGNOSIS — I252 Old myocardial infarction: Secondary | ICD-10-CM | POA: Diagnosis not present

## 2018-10-26 DIAGNOSIS — N390 Urinary tract infection, site not specified: Secondary | ICD-10-CM | POA: Diagnosis present

## 2018-10-26 DIAGNOSIS — I272 Pulmonary hypertension, unspecified: Secondary | ICD-10-CM | POA: Diagnosis present

## 2018-10-26 DIAGNOSIS — E669 Obesity, unspecified: Secondary | ICD-10-CM | POA: Diagnosis present

## 2018-10-26 DIAGNOSIS — N179 Acute kidney failure, unspecified: Secondary | ICD-10-CM | POA: Diagnosis present

## 2018-10-26 DIAGNOSIS — F0390 Unspecified dementia without behavioral disturbance: Secondary | ICD-10-CM | POA: Diagnosis present

## 2018-10-26 DIAGNOSIS — I4819 Other persistent atrial fibrillation: Secondary | ICD-10-CM | POA: Diagnosis present

## 2018-10-26 DIAGNOSIS — I110 Hypertensive heart disease with heart failure: Secondary | ICD-10-CM | POA: Diagnosis present

## 2018-10-26 DIAGNOSIS — E871 Hypo-osmolality and hyponatremia: Secondary | ICD-10-CM | POA: Diagnosis present

## 2018-10-26 DIAGNOSIS — Z7901 Long term (current) use of anticoagulants: Secondary | ICD-10-CM | POA: Diagnosis not present

## 2018-10-26 DIAGNOSIS — Z79899 Other long term (current) drug therapy: Secondary | ICD-10-CM | POA: Diagnosis not present

## 2018-10-26 DIAGNOSIS — E785 Hyperlipidemia, unspecified: Secondary | ICD-10-CM | POA: Diagnosis present

## 2018-10-26 DIAGNOSIS — I083 Combined rheumatic disorders of mitral, aortic and tricuspid valves: Secondary | ICD-10-CM | POA: Diagnosis present

## 2018-10-26 DIAGNOSIS — I4891 Unspecified atrial fibrillation: Secondary | ICD-10-CM | POA: Diagnosis not present

## 2018-10-26 DIAGNOSIS — Z888 Allergy status to other drugs, medicaments and biological substances status: Secondary | ICD-10-CM | POA: Diagnosis not present

## 2018-10-26 DIAGNOSIS — I10 Essential (primary) hypertension: Secondary | ICD-10-CM | POA: Diagnosis not present

## 2018-10-26 DIAGNOSIS — R0602 Shortness of breath: Secondary | ICD-10-CM | POA: Diagnosis present

## 2018-10-26 DIAGNOSIS — B9620 Unspecified Escherichia coli [E. coli] as the cause of diseases classified elsewhere: Secondary | ICD-10-CM | POA: Diagnosis present

## 2018-10-26 LAB — BASIC METABOLIC PANEL
Anion gap: 11 (ref 5–15)
BUN: 19 mg/dL (ref 8–23)
CHLORIDE: 99 mmol/L (ref 98–111)
CO2: 24 mmol/L (ref 22–32)
Calcium: 8.9 mg/dL (ref 8.9–10.3)
Creatinine, Ser: 1.03 mg/dL — ABNORMAL HIGH (ref 0.44–1.00)
GFR calc non Af Amer: 48 mL/min — ABNORMAL LOW (ref >60)
GFR, EST AFRICAN AMERICAN: 55 mL/min — AB (ref >60)
Glucose, Bld: 93 mg/dL (ref 70–99)
POTASSIUM: 4 mmol/L (ref 3.5–5.1)
SODIUM: 134 mmol/L — AB (ref 135–145)

## 2018-10-26 LAB — CBC
HEMATOCRIT: 38 % (ref 36.0–46.0)
HEMOGLOBIN: 12.1 g/dL (ref 12.0–15.0)
MCH: 28.7 pg (ref 26.0–34.0)
MCHC: 31.8 g/dL (ref 30.0–36.0)
MCV: 90 fL (ref 80.0–100.0)
NRBC: 0 % (ref 0.0–0.2)
Platelets: 342 10*3/uL (ref 150–400)
RBC: 4.22 MIL/uL (ref 3.87–5.11)
RDW: 16.7 % — ABNORMAL HIGH (ref 11.5–15.5)
WBC: 12.6 10*3/uL — AB (ref 4.0–10.5)

## 2018-10-26 LAB — TROPONIN I: Troponin I: <0.03 ng/mL (ref <0.03)

## 2018-10-26 MED ORDER — METOPROLOL TARTRATE 25 MG PO TABS
25.0000 mg | ORAL_TABLET | Freq: Two times a day (BID) | ORAL | Status: DC
  Administered 2018-10-26 – 2018-11-01 (×12): 25 mg via ORAL
  Filled 2018-10-26 (×12): qty 1

## 2018-10-26 MED ORDER — POLYETHYLENE GLYCOL 3350 17 G PO PACK
17.0000 g | PACK | Freq: Two times a day (BID) | ORAL | Status: DC
  Administered 2018-10-26 – 2018-11-01 (×10): 17 g via ORAL
  Filled 2018-10-26 (×10): qty 1

## 2018-10-26 MED ORDER — SENNOSIDES-DOCUSATE SODIUM 8.6-50 MG PO TABS
1.0000 | ORAL_TABLET | Freq: Two times a day (BID) | ORAL | Status: DC
  Administered 2018-10-26 – 2018-11-01 (×10): 1 via ORAL
  Filled 2018-10-26 (×11): qty 1

## 2018-10-26 NOTE — Evaluation (Signed)
Occupational Therapy Evaluation Patient Details Name: Nicole Barker MRN: [id number] DOB: 07-26-1930 Today's Date: 10/26/2018    History of Present Illness Pt was admitted with dyspnea.  PMH:  A fib, heart failure, HTN and dementia   Clinical Impression   This 84 year old female was admitted for the above.  Per chart, she is from [facility].  Pt is a poor historian; unsure of PLOF. Will follow in acute setting with min guard level goals. She was limited from a cardiopulmonary perspective today and needed up to max A for LB adls and min A for SPT.    Follow Up Recommendations  SNF(A for ADLs/mobility and HHOT at ALF vs)    Equipment Recommendations  (to be further assessed ?3:1)    Recommendations for Other Services       Precautions / Restrictions Precautions Precautions: Fall Restrictions Weight Bearing Restrictions: No      Mobility Bed Mobility Overal bed mobility: Needs Assistance             General bed mobility comments: min guard for lines for back to bed  Transfers Overall transfer level: Needs assistance Equipment used: None Transfers: Sit to/from Stand;Stand Pivot Transfers Sit to Stand: Min assist Stand pivot transfers: Min guard       General transfer comment: light assistance to stand; close guard for transfer    Balance                                           ADL either performed or assessed with clinical judgement   ADL Overall ADL's : Needs assistance/impaired Eating/Feeding: Independent   Grooming: Set up;Sitting   Upper Body Bathing: Minimal assistance;Sitting   Lower Body Bathing: Moderate assistance;Sitting/lateral leans   Upper Body Dressing : Minimal assistance;Sitting   Lower Body Dressing: Maximal assistance;Sit to/from stand   Toilet Transfer: Minimal assistance;Stand-pivot;BSC;RW   Toileting- Clothing Manipulation and Hygiene: Moderate assistance;Sit to/from stand         General ADL  Comments: HR up to 140 with transfer to [facility].  ADLs limited by HR     Vision         Perception     Praxis      Pertinent Vitals/Pain Pain Assessment: Faces Faces Pain Scale: Hurts even more Pain Location: L thigh Pain Descriptors / Indicators: Cramping Pain Intervention(s): Limited activity within patient's tolerance;Monitored during session;Repositioned     Hand Dominance     Extremity/Trunk Assessment Upper Extremity Assessment Upper Extremity Assessment: Generalized weakness(bil shoulders limited R approx 70; L 80)           Communication Communication Communication: No difficulties   Cognition Arousal/Alertness: Awake/alert Behavior During Therapy: WFL for tasks assessed/performed                                   General Comments: h/o dementia; no family available. Aware that she is in the hospital, repeatedly asks same questions.     General Comments    HR 110-140    Exercises     Shoulder Instructions      Home Living Family/patient expects to be discharged to:: Assisted living  Additional Comments: from [facility]      Prior Functioning/Environment          Comments: unsure of PLOF.  Pt with dementia:  self report doesn't match our information. Aware that she is in the hospital        OT Problem List: Decreased strength;Decreased activity tolerance;Pain;Decreased cognition;Cardiopulmonary status limiting activity;Impaired balance (sitting and/or standing);Decreased safety awareness      OT Treatment/Interventions: Self-care/ADL training;Therapeutic exercise;DME and/or AE instruction;Therapeutic activities;Cognitive remediation/compensation;Balance training;Patient/family education    OT Goals(Current goals can be found in the care plan section) Acute Rehab OT Goals Patient Stated Goal: none stated OT Goal Formulation: With patient Time For Goal Achievement: 11/09/18 Potential to  Achieve Goals: Good ADL Goals Pt Will Transfer to Toilet: with min guard assist;ambulating;bedside commode Pt Will Perform Toileting - Clothing Manipulation and hygiene: with min guard assist;sit to/from stand Additional ADL Goal #1: Pt will perform UB adls with set up and LB adls with min guard A  OT Frequency: Min 2X/week   Barriers to D/C:            Co-evaluation              AM-PAC PT "6 Clicks" Daily Activity     Outcome Measure Help from another person eating meals?: A Little Help from another person taking care of personal grooming?: A Little Help from another person toileting, which includes using toliet, bedpan, or urinal?: A Lot Help from another person bathing (including washing, rinsing, drying)?: A Lot Help from another person to put on and taking off regular upper body clothing?: A Little Help from another person to put on and taking off regular lower body clothing?: A Lot 6 Click Score: 15   End of Session Nurse Communication: Patient requests pain meds  Activity Tolerance: Treatment limited secondary to medical complications (Comment) Patient left: in bed;with call bell/phone within reach;with bed alarm set  OT Visit Diagnosis: Muscle weakness (generalized) (M62.81);Unsteadiness on feet (R26.81)                Time: 0174-9449 OT Time Calculation (min): 18 min Charges:  OT General Charges $OT Visit: 1 Visit OT Evaluation $OT Eval Low Complexity: [place], OTR/L Acute Rehabilitation Services [phone] [facility] pager [phone] office 10/26/2018  [place] 10/26/2018, 11:03 AM

## 2018-10-26 NOTE — Progress Notes (Signed)
Chaplain providing support around life change / grief / reorienting.    Engaged in life review, spiritual support, prayers   Ms. Requena is catholic and her faith figures prominently in her coping.  She wishes to be added to the catholic list to be visited by [employer].

## 2018-10-26 NOTE — Evaluation (Signed)
Physical Therapy Evaluation Patient Details Name: Nicole Barker MRN: [id number] DOB: [birth year] Today's Date: 10/26/2018   History of Present Illness  Pt was admitted with dyspnea.  PMH:  A fib, heart failure, HTN and dementia  Clinical Impression  Pt admitted with above diagnosis. Pt currently with functional limitations due to the deficits listed below (see PT Problem List). Pt doing well this pm, anxious and agreeable to get OOB, VSS during amb--see below for details;  Pt will benefit from skilled PT to increase their independence and safety with mobility to allow discharge to the venue listed below.  Will follow in acute setting     Follow Up Recommendations Home health PT(HHPT at ALF vs SNF pending progress)    Equipment Recommendations  None recommended by PT    Recommendations for Other Services       Precautions / Restrictions Precautions Precautions: Fall Restrictions Weight Bearing Restrictions: No      Mobility  Bed Mobility Overal bed mobility: Needs Assistance Bed Mobility: Supine to Sit     Supine to sit: Supervision     General bed mobility comments: for safety, incr time needed; assist for lines only  Transfers Overall transfer level: Needs assistance Equipment used: Rolling walker (2 wheeled) Transfers: Sit to/from Stand Sit to Stand: Min guard;Min assist         General transfer comment: light assistance to stand, assist to manage lines, cues for safety  Ambulation/Gait Ambulation/Gait assistance: Min guard;Min assist Gait Distance (Feet): 180 Feet Assistive device: Rolling walker (2 wheeled) Gait Pattern/deviations: Step-through pattern;Decreased stride length;Trunk flexed     General Gait Details: multi-modal cues for trunk extension, breathing, 2 standing rest breaks; HR 88-186max, SpO2=95% on RA, BP 109/66 prior to amb; mild DOE, pt in NAD during mobility  Stairs            Wheelchair Mobility    Modified Rankin (Stroke  Patients Only)       Balance Overall balance assessment: Needs assistance;History of Falls   Sitting balance-Leahy Scale: Fair       Standing balance-Leahy Scale: Fair Standing balance comment: requires UE support for dynamic                             Pertinent Vitals/Pain Pain Assessment: No/denies pain    Home Living Family/patient expects to be discharged to:: Assisted living               Home Equipment: Walker - 4 wheels Additional Comments: from [facility]    Prior Function           Comments: per dtr pt walked 38mi/day PTA     Hand Dominance        Extremity/Trunk Assessment   Upper Extremity Assessment Upper Extremity Assessment: Defer to OT evaluation    Lower Extremity Assessment Lower Extremity Assessment: Overall WFL for tasks assessed       Communication   Communication: No difficulties  Cognition Arousal/Alertness: Awake/alert Behavior During Therapy: WFL for tasks assessed/performed Overall Cognitive Status: History of cognitive impairments - at baseline                                        General Comments      Exercises     Assessment/Plan    PT Assessment Patient needs continued PT services  PT Problem List  Decreased activity tolerance;Decreased balance;Decreased knowledge of use of DME;Decreased mobility;Decreased safety awareness;Decreased cognition       PT Treatment Interventions DME instruction;Gait training;Therapeutic activities;Functional mobility training;Therapeutic exercise;Patient/family education;Balance training    PT Goals (Current goals can be found in the Care Plan section)  Acute Rehab PT Goals Patient Stated Goal: none stated PT Goal Formulation: With patient/family Time For Goal Achievement: 11/09/18 Potential to Achieve Goals: Good    Frequency Min 3X/week   Barriers to discharge        Co-evaluation               AM-PAC PT "6 Clicks" Daily Activity   Outcome Measure Difficulty turning over in bed (including adjusting bedclothes, sheets and blankets)?: Unable Difficulty moving from lying on back to sitting on the side of the bed? : Unable Difficulty sitting down on and standing up from a chair with arms (e.g., wheelchair, bedside commode, etc,.)?: Unable Help needed moving to and from a bed to chair (including a wheelchair)?: A Little Help needed walking in hospital room?: A Little Help needed climbing 3-5 steps with a railing? : A Lot 6 Click Score: 11    End of Session Equipment Utilized During Treatment: Gait belt Activity Tolerance: Patient tolerated treatment well Patient left: in chair;with call bell/phone within reach;with family/visitor present;with chair alarm set   PT Visit Diagnosis: Unsteadiness on feet (R26.81)    Time: 6503-5465 PT Time Calculation (min) (ACUTE ONLY): 20 min   Charges:   PT Evaluation $PT Eval Low Complexity: 1 Low          Kenyon Ana, PT  Pager: [phone] Acute Rehab Dept [facility]): [phone]   10/26/2018   [facility] 10/26/2018, 3:42 PM

## 2018-10-26 NOTE — Consult Note (Addendum)
Cardiology Consultation:   Patient ID: Nicole Barker MRN: [id number]; DOB: 08/24/1930  Admit date: 10/25/2018 Date of Consult: 10/26/2018  Primary Care Provider: Joya San, MD Primary Cardiologist:New (Dr. Stanford Breed) Primary Electrophysiologist:  None    Patient Profile:   Nicole Barker is a 84 y.o. female, recently moved from [place] to be closer to family, with a reported hx of heart failure, atrial fibrillation, chronic anticoagulation, HTN, HLD, dementia and asthma who is being seen today for the evaluation of acute combined systolic and diastolic heart failure and atrial fibrillation w/ RVR, at the request of Dr. Lonny Prude, Internal Medicine.   History of Present Illness:   Nicole Barker has limited electronic medical records. Per ED notes and H&P, pt recently moved here from [facility] ~2 weeks ago. She is residing at [facility]. Per H&P, she has a reported hx of heart failure, atrial fibrillation, HTN, HLD, dementia and asthma.  She presented to the [facility] ED on 10/25/18 with CC of dyspnea. Also endorsed urinary frequency. In ED, she was afebrile, tachycardic (afib w/ RVR, 116 bpm), tachypneic and hypertensive. WBC 12.7. Na 132, Scr 1.01. BNP elevated at 1010. CXR also c/w acute CHF with cardiomegaly, mild edema and bilateral effusions. Bibasilar airspace disease also present, likely reflecting atelectasis, left greater than right. Aortic atherosclerosis noted. Troponin negative x 3. Her UA was + for UTI. Culture pending.   She was admitted by IM and started on antibiotics for UTI. IV lasix initiated for CHF. Losartan held due to elevated SCr. IV metoprolol was given in the ED to help with BP and HR. Her home antiarrythmic, Multaq, was continued as well as Eliquis for atrial fibrillation. Echo ordered showing mildly reduced LVEF at 45-50% with diffuse hypokinesis. Doppler parameters c/w high ventricular filling pressures. Mild AS, mild AI, mild MS, moderate MR,  moderate TR, mild LAE and moderate to severe pulmonary hypertension also noted.   She has had good urinary response to IV Lasix thus far. 2.4L out overnight. SCr unchanged at 1.03. K is WNL at 4.0. Na 134. BP improved, at 145/96 (217/122 in the ED last night). Cardiology asked to assist with further management.   Pt notes that she is feeling a bit better today but still short of breath and requiring supplemental O2. She denies CP. No palpitations. She does not remember the name of her cardiologist or office practice in [place]. No family present at bedside.    Past Medical History:  Diagnosis Date  . Anxiety   . Atrial fibrillation ([place])   . Dementia ([place])   . Hyperlipidemia   . Hypertension     Past Surgical History:  Procedure Laterality Date  . ABDOMINAL HYSTERECTOMY    . BACK SURGERY    . JOINT REPLACEMENT     knee     Home Medications:  Prior to Admission medications   Medication Sig Start Date End Date Taking? Authorizing Provider  acetaminophen (TYLENOL) 500 MG tablet Take 1,000 mg by mouth 3 (three) times daily.   Yes [provider]  apixaban (ELIQUIS) 5 MG TABS tablet Take 5 mg by mouth 2 (two) times daily.   Yes [provider]  dronedarone (MULTAQ) 400 MG tablet Take 400 mg by mouth 2 (two) times daily with a meal.   Yes [provider]  furosemide (LASIX) 20 MG tablet Take 20 mg by mouth daily.   Yes [provider]  gabapentin (NEURONTIN) 100 MG capsule Take 100 mg by mouth 2 (two)  times daily.   Yes [provider]  losartan (COZAAR) 100 MG tablet Take 100 mg by mouth daily.   Yes [provider]  Multiple Vitamins-Minerals (CENTRUM SILVER 50+WOMEN) TABS Take 1 tablet by mouth daily.   Yes [provider]  potassium chloride (K-DUR,KLOR-CON) 10 MEQ tablet Take 10 mEq by mouth daily.   Yes [provider]  traMADol (ULTRAM) 50 MG tablet Take 50 mg by mouth every 12 (twelve) hours as needed for moderate  pain.   Yes [provider]    Inpatient Medications: Scheduled Meds: . apixaban  5 mg Oral BID  . chlorhexidine  15 mL Mouth Rinse BID  . dronedarone  400 mg Oral BID WC  . furosemide  40 mg Intravenous BID  . gabapentin  100 mg Oral BID  . mouth rinse  15 mL Mouth Rinse q12n4p  . potassium chloride  20 mEq Oral BID  . sodium chloride flush  3 mL Intravenous Q12H   Continuous Infusions: . sodium chloride    . cefTRIAXone (ROCEPHIN)  IV Stopped (10/25/18 2248)  . diltiazem (CARDIZEM) infusion 5 mg/hr (10/25/18 2309)   PRN Meds: sodium chloride, acetaminophen, hydrALAZINE, ondansetron (ZOFRAN) IV, sodium chloride flush, traMADol  Allergies:    Allergies  Allergen Reactions  . Iodine Other (See Comments)    Unknown    Social History:   Social History   Socioeconomic History  . Marital status: Widowed    Spouse name: Not on file  . Number of children: Not on file  . Years of education: Not on file  . Highest education level: Not on file  Occupational History  . Not on file  Social Needs  . Financial resource strain: Not on file  . Food insecurity:    Worry: Not on file    Inability: Not on file  . Transportation needs:    Medical: Not on file    Non-medical: Not on file  Tobacco Use  . Smoking status: Never Smoker  . Smokeless tobacco: Never Used  Substance and Sexual Activity  . Alcohol use: Never    Frequency: Never  . Drug use: Never  . Sexual activity: Not on file  Lifestyle  . Physical activity:    Days per week: Not on file    Minutes per session: Not on file  . Stress: Not on file  Relationships  . Social connections:    Talks on phone: Not on file    Gets together: Not on file    Attends religious service: Not on file    Active member of club or organization: Not on file    Attends meetings of clubs or organizations: Not on file    Relationship status: Not on file  . Intimate partner violence:    Fear of current or ex partner: Not on  file    Emotionally abused: Not on file    Physically abused: Not on file    Forced sexual activity: Not on file  Other Topics Concern  . Not on file  Social History Narrative  . Not on file    Family History:    Family History  Problem Relation Age of Onset  . Hypertension Mother      ROS:  Please see the history of present illness.   All other ROS reviewed and negative.     Physical Exam/Data:   Vitals:   10/26/18 0500 10/26/18 0517 10/26/18 0600 10/26/18 0700  BP: 125/72  132/69 (!) 145/96  Pulse: (!) 125  (!) 121 (!) 117  Resp: (!) 22  (!) 22 (!) 24  Temp:  98.3 F (36.8 C)    TempSrc:  Oral    SpO2: 97%  98% 95%  Weight:      Height:        Intake/Output Summary (Last 24 hours) at 10/26/2018 0740 Last data filed at 10/25/2018 2309 Gross per 24 hour  Intake 14.02 ml  Output 2400 ml  Net -2385.98 ml   Filed Weights   10/25/18 0836 10/25/18 1304 10/26/18 0343  Weight: 65.8 kg 71.9 kg 72.2 kg   Body mass index is 32.15 kg/m.  General:  Elderly WF, looks younger than actual age, Well nourished, well developed, in no acute distress HEENT: normal Lymph: no adenopathy Neck: no JVD Endocrine:  No thryomegaly Vascular: No carotid bruits; FA pulses 2+ bilaterally without bruits  Cardiac:  irregularly irregular, tachy rate Lungs:  clear to auscultation bilaterally, no wheezing, rhonchi or rales  Abd: soft, nontender, no hepatomegaly  Ext: no edema Musculoskeletal:  No deformities, BUE and BLE strength normal and equal Skin: warm and dry  Neuro:  CNs 2-12 intact, no focal abnormalities noted Psych:  Normal affect   EKG:  The EKG was personally reviewed and demonstrates:  Afib RVR 116 bpm Telemetry:  Telemetry was personally reviewed and demonstrates:  afib w/ RVR in the 120s- 130s  Relevant CV Studies: 2D Echo 10/25/18 Study Conclusions  - Left ventricle: The cavity size was normal. Wall thickness was   increased in a pattern of mild LVH. Systolic  function was mildly   reduced. The estimated ejection fraction was in the range of 45%   to 50%. Diffuse hypokinesis. Doppler parameters are consistent   with high ventricular filling pressure. - Aortic valve: Valve mobility was restricted. There was mild   stenosis. There was mild regurgitation. - Mitral valve: Calcified annulus. The findings are consistent with   mild stenosis. There was moderate regurgitation. - Left atrium: The atrium was mildly dilated. - Tricuspid valve: There was moderate regurgitation. - Pulmonary arteries: Systolic pressure was moderately to severely   increased. PA peak pressure: 65 mm Hg (S). - Pericardium, extracardiac: There was a left pleural effusion.  Impressions:  - Mild global reduction in LV systolic function; mild LVH;   calcified aortic valve with mild AS (mean gradient 15 mmHg) and   mild AI; MAC with mild MS (mean gradient 5 mmHg); moderate MR;   mild LAE; moderate TR; moderate to severe pulmonary hypertension.  Laboratory Data:  Chemistry Recent Labs  Lab 10/25/18 0943 10/26/18 0518  NA 132* 134*  K 4.5 4.0  CL 98 99  CO2 22 24  GLUCOSE 109* 93  BUN 16 19  CREATININE 1.01* 1.03*  CALCIUM 9.0 8.9  GFRNONAA 49* 48*  GFRAA 57* 55*  ANIONGAP 12 11    Recent Labs  Lab 10/25/18 0943  PROT 6.6  ALBUMIN 3.6  AST 32  ALT 30  ALKPHOS 162*  BILITOT 1.0   Hematology Recent Labs  Lab 10/25/18 0943 10/26/18 0518  WBC 12.7* 12.6*  RBC 4.25 4.22  HGB 12.2 12.1  HCT 38.5 38.0  MCV 90.6 90.0  MCH 28.7 28.7  MCHC 31.7 31.8  RDW 16.6* 16.7*  PLT 350 342   Cardiac Enzymes Recent Labs  Lab 10/25/18 1729 10/25/18 2307 10/26/18 0518  TROPONINI <0.03 <0.03 <0.03    Recent Labs  Lab 10/25/18 0949  TROPIPOC 0.00    BNP  Recent Labs  Lab 10/25/18 0943  BNP 1,010.4*    DDimer No results for input(s): DDIMER in the last 168 hours.  Radiology/Studies:  Dg Chest 2 View  Result Date: 10/25/2018 CLINICAL DATA:  Shortness  of breath. EXAM: CHEST - 2 VIEW COMPARISON:  None. FINDINGS: The heart is enlarged. Aortic atherosclerosis is present. A mild interstitial pattern present. Some of this is likely chronic. Small effusions are present. Bibasilar airspace disease likely reflects atelectasis. IMPRESSION: 1. Cardiomegaly with mild edema and bilateral effusions suggesting congestive heart failure. 2. Bibasilar airspace disease likely reflects atelectasis, left greater than right. 3. Aortic atherosclerosis. Electronically Signed   By: San Morelle M.D.   On: 10/25/2018 09:27    Assessment and Plan:   Nicole Barker is a 84 y.o. female, recently moved from [place] to be closer to family, with a reported hx of heart failure, atrial fibrillation, chronic anticoagulation, HTN, HLD, dementia and asthma who is being seen today for the evaluation of acute combined systolic and diastolic heart failure and atrial fibrillation, at the request of Dr. Lonny Prude, Internal Medicine. Other active hospital problems include UTI.   1. Acute Combined Systolic and Diastolic HF: duration of HF unknown. Reported in [place]. Echo this admit shows midly reduced LVEF at 45-50% w/ diffuse hypokinesis. No wall motion abnormalities noted. BNP and CXR both c/w acute CHF. BNP elevated at 1010 on admit. CXR with cardiomegaly and mild edema. Echo also demonstrated high ventricular filling pressures. Agree with IV diuretics. She has had a good response thus far w/ 2.4 L out overnight. Renal function and K stable. She continues to complain of dyspnea and requiring supplemental O2. Continue IV diuretics, strict I/Os, daily weights and close monitoring of renal function and electrolytes. Once euvolemic, transition to PO Lasix. She would benefit from oral BB, either metoprolol succinate or Coreg given her systolic HF, afib and HTN. Resume Losartan if renal function remains stable.   2. Atrial Fibrillation w/ RVR: duration known. Was being treated for this  prior to move to [place]. Multaq listed in home meds. Also on Eliquis 5 mg BID for a/c. Given her HF, we will need to consider stopping Multaq. Will defer to Dr. Stanford Breed. ? changing to amiodarone. Continue IV Cardizem for rate control for now, but would recommend transtion to oral  blocker and avoidance of long term CCB use due to LV dysfunction.   3. HTN: improved overnight (2117/122 in the ED). Remains midly elevated at 145/96, in the setting of volume overload/ CHF. Continue diuresis w/ IV Lasix. Resume home dose of Losartan, pending renal function.   4. Valvular Disease: Echo 11/5 showed Mild AS, mild AI, mild MS, moderate MR, moderate TR.  5. Renal Insuffiencey: scr 1.01 on admit, baseline unknown. Home losartan was held on admit. Pt getting IV Lasix for acute CHF. SCr stable today at 1.03. Continue to monitor while diuresing. If renal function remains stable, can add back Losartan.   6. UTI: UA w/ leukocytes and many bacteria. Nitrate +. On antibiotics. Culture pending.   7. Dementia: management per IM.  8. Pulmonary HTN: noted on echo. Moderate to severe. PA pressure 65 mm Hg. Continue IV diuretics.    For questions or updates, please contact [place] Please consult [email or web address] for contact info under     Signed, Lyda Jester, PA-C  10/26/2018 7:40 AM As above, patient seen and examined.  Briefly she is an 84 year old female with past medical history of congestive heart failure, atrial fibrillation,  hypertension, hyperlipidemia, dementia, asthma who we are asked to evaluate for acute on chronic combined systolic/diastolic congestive heart failure and atrial fibrillation.  I do not have any records available as patient previously resided in [place].  She apparently has a history of atrial fibrillation but details unknown.  Also with history of congestive heart failure by her report.  She recently moved to the area to be close to her daughter.  She complains of increased dyspnea  on exertion, orthopnea for 2 weeks.  She denies increased pedal edema, chest pain, palpitations or syncope.  She is on apixaban but I have no information concerning compliance.  Patient admitted and cardiology asked to evaluate.  Chest x-ray shows congestive heart failure and pleural effusions.  Creatinine 1.03.  Troponins normal.  BNP 1010.  Hemoglobin 12.1.  Electrocardiogram shows atrial fibrillation and prior septal infarct cannot be excluded.  Echocardiogram shows mild LV dysfunction, mild aortic stenosis/aortic insufficiency, mild MS and moderate mitral regurgitation, mild LAE, moderate tricuspid regurgitation with moderate to severe pulmonary hypertension.  1 acute on chronic combined systolic/diastolic congestive heart failure-she is volume overloaded on examination.  Continue Lasix at 40 mg IV twice daily.  Follow potassium and renal function.  2 atrial fibrillation-I do not have records concerning prior history but she is on Multaq suggesting rhythm control strategy in the past.  I wonder if she has developed recurrent atrial fibrillation which has caused her recent CHF exacerbation.  I will discontinue Multaq as would be contraindicated in patient with CHF.  Will likely add amiodarone in AM. Continue Cardizem for rate control.  Rate remains elevated.  Add metoprolol 25 mg twice daily.  Continue apixaban (CHADSvasc 5).  We will try and obtain outside records and if she has been in sinus recently and we feel atrial fibrillation is contributing to CHF would be reasonable to proceed with cardioversion.  Would need to verify with patient's daughter that patient has been compliant with apixaban.  3 mild LV dysfunction-we are adding beta-blockade.  Will resume ARB later as blood pressure allows.  4 valvular heart disease with mild aortic stenosis/mild aortic insufficiency and mild mitral stenosis/moderate mitral insufficiency-she will need follow-up echoes in the future.  Conservative measures given age  and overall medical problems.  5 dementia  6 hypertension-blood pressure is mildly elevated.  Add metoprolol both for rate control of atrial fibrillation and blood pressure.  Follow and adjust as needed.  Kirk Ruths, MD

## 2018-10-26 NOTE — Care Management Note (Signed)
Case Management Note  Patient Details  Name: Nicole Barker MRN: [id number] Date of Birth: [birth year]  Subjective/Objective:                  A. Fib with rvr and unclean u/a, iv cardizem, iv rocephin, wbc=12.6  Action/Plan: Following for progression and for cm needs  Expected Discharge Date:  (unknown)               Expected Discharge Plan:  Home/Self Care  In-House Referral:     Discharge planning Services  CM Consult  Post Acute Care Choice:    Choice offered to:     DME Arranged:    DME Agency:     HH Arranged:    HH Agency:     Status of Service:  In process, will continue to follow  If discussed at Long Length of Stay Meetings, dates discussed:    Additional Comments:  Leeroy Cha, RN 10/26/2018, 9:14 AM

## 2018-10-26 NOTE — Progress Notes (Signed)
PROGRESS NOTE    Nicole Barker  QMG:[id number] DOB: February 14, 1930 DOA: 10/25/2018 PCP: Joya San, MD   Brief Narrative:  HPI per Dr. Cordelia Poche on 10/25/18 HPI: Nicole Barker is a 84 y.o. female with medical history significant of heart failure, atrial fibrillation, dementia, hypertension, hyperlipidemia. Patient unable to provide history. She is unsure of why she is here. She does state that she is having shortness of breath of unknown time frame. She also has some frequency.  ED Course: Vitals: Afebrile, tachycardia, tachypnea, hypertensive, on room air Labs: sodium of 132, creatinine of 1.01, alkaline phosphatase of 162, BNP of 1010, WBC of 12.7 Imaging: Chest x-ray significant for cardiomegaly, edema, bilateral effusion, atelectasis Medications/Course: Albuterol, lasix, lopressor, given  **States that she is feeling a little bit better however still remains somewhat short of breath.  States that she urinated quite a bit overnight.  No other concerns or complaints at this time.  Cannot remember who she saw in [facility] for her cardiologist  Assessment & Plan:   Principal Problem:   Acute heart failure ([place]) Active Problems:   Atrial fibrillation with RVR ([place])   Dementia without behavioral disturbance ([place])   Essential hypertension   Hyperlipidemia   AKI (acute kidney injury) ([place])  Acute Respiratory Failure with Hypoxia in the setting of Acute Decompensated Combined Systolic and Diastolic CHF with an EF of 45-50% -Continue supplemental oxygen via nasal cannula and wean O2 as tolerated -Continuous pulse oximetry and maintain O2 saturations greater than 92% -Repeat chest x-ray in a.m. -Continue with diuresis -We will need home Ambulatory screen prior to discharge  Acute on Chronic combined systolic and diastolic CHF with an EF of 40 to 45% -On lasix, losartan and potassium as an outpatient. -BNP on Admission was 1,010.4  -Troponin negative x3 No chest  pain.  -Weight on admission of 145 lbs -C/w Lasix 40 mg IV BID with potassium supplementation -Strict in and out/daily weights -Watch kidney function while being diuresed -Transthoracic Echocardiogram as below -Oxygen as needed to keep O2 >94% -Continue to Hold losartan until creatinine improves or appears stable -Patient is -2.386 L since admission; however question of weights are accurate and his weight has gone up to 159 -Continue diuresis as above as patient remains volume overloaded -Continue to monitor volume status daily -Cardiology consulted for further evaluation recommendations and patient started on metoprolol 25 mg p.o. twice daily we will continue  Atrial fibrillation with RVR -Improved with metoprolol in ED. Possibly contributed to acute heart failure -Stopped Dronedarone as contraindicated in patient with CHF but will continue with apixaban for anticoagulation -Continue with telemetry -Cardiology evaluated and added metoprolol 25 mill grams p.o. twice daily -Patient remains on Cardizem drip for rate control and cardiology will likely add amiodarone in the a.m. -We will need to obtain outside records to see if she has been in sinus recently and she has may be reasonable to proceed with cardioversion however this will need to be verified  -CHA2DS2-VASc is elevated at 5  Acute kidney injury but suspect Chronic Kidney Disease -No baseline. Likely in setting of heart failure. CrCl of 32.9 -C/w IV Lasix -Adjust medications for decreased function -BUN/Cr went from 12/1.01 -> 19/1.03 -Continue to Monitor and Trend Renal Function -Repeat CMP in AM   Urinary frequency with suspected UTI -Urinalysis showed clear urine small hemoglobin, small leukocytes, positive nitrites, many bacteria, 0-5 WBCs -Urine culture showed greater than 100,000 colonies gram-negative rods -Continue with empiric IV Ceftriaxone adjust based on sensitivities  Dementia -Does not appear to be on  medications. Currently resides at [facility] but states she lives with her daughter. -Social work consulted   Essential Hypertension Uncontrolled in setting of fluid overload. -Hold Losartan for now and cardiology has added Metoprolol 25 mg p.o. twice daily -Lasix as above  Hyponatremia -In setting of volume overload -Continue with diuresis and sodium is improving is now 134 -Continue monitor and repeat CMP in a.m.  Valvular Heart Disease -Seen on ECHO -Cardiology following and recommending conservative measures   Obesity -Estimated body mass index is 32.15 kg/m as calculated from the following:   Height as of this encounter: _0  (1.499 m).   Weight as of this encounter: 72.2 kg. -Weight Loss Counseling  DVT prophylaxis: Anticoagulated  Code Status: Deferred Family Communication: No family present at bedside Disposition Plan: Pending PT Evaluation  Consultants:  Cardiology   Procedures:  ECHOCARDIOGRAM 10/25/18 ------------------------------------------------------------------- Study Conclusions  - Left ventricle: The cavity size was normal. Wall thickness was   increased in a pattern of mild LVH. Systolic function was mildly   reduced. The estimated ejection fraction was in the range of 45%   to 50%. Diffuse hypokinesis. Doppler parameters are consistent   with high ventricular filling pressure. - Aortic valve: Valve mobility was restricted. There was mild   stenosis. There was mild regurgitation. - Mitral valve: Calcified annulus. The findings are consistent with   mild stenosis. There was moderate regurgitation. - Left atrium: The atrium was mildly dilated. - Tricuspid valve: There was moderate regurgitation. - Pulmonary arteries: Systolic pressure was moderately to severely   increased. PA peak pressure: 65 mm Hg (S). - Pericardium, extracardiac: There was a left pleural effusion.  Impressions:  - Mild global reduction in LV systolic function;  mild LVH;   calcified aortic valve with mild AS (mean gradient 15 mmHg) and   mild AI; MAC with mild MS (mean gradient 5 mmHg); moderate MR;   mild LAE; moderate TR; moderate to severe pulmonary hypertension   Antimicrobials:  Anti-infectives (From admission, onward)   Start     Dose/Rate Route Frequency Ordered Stop   10/25/18 2200  cefTRIAXone (ROCEPHIN) 1 g in sodium chloride 0.9 % 100 mL IVPB     1 g 200 mL/hr over 30 Minutes Intravenous Every 24 hours 10/25/18 2144       Subjective: Seen and examined at bedside and patient with no better than when she came in.  He still remains somewhat short of breath but no lightheadedness or dizziness.  Denies any chest pain.  Patient feels like she is improving but still not back to baseline.  Cannot remember her cardiologist name in [place].  No other concerns or complaints at this time  Objective: Vitals:   10/26/18 0500 10/26/18 0517 10/26/18 0600 10/26/18 0700  BP: 125/72  132/69 (!) 145/96  Pulse: (!) 125  (!) 121 (!) 117  Resp: (!) 22  (!) 22 (!) 24  Temp:  98.3 F (36.8 C)    TempSrc:  Oral    SpO2: 97%  98% 95%  Weight:      Height:        Intake/Output Summary (Last 24 hours) at 10/26/2018 0727 Last data filed at 10/25/2018 2309 Gross per 24 hour  Intake 14.02 ml  Output 2400 ml  Net -2385.98 ml   Filed Weights   10/25/18 0836 10/25/18 1304 10/26/18 0343  Weight: 65.8 kg 71.9 kg 72.2 kg   Examination: Physical Exam:  Constitutional: WN/WD obese pleasantly demenated Caucasian female in NAD and appears calm and comfortable Eyes: Lids and conjunctivae normal, sclerae anicteric  ENMT: External Ears, Nose appear normal. Grossly normal hearing. Mucous membranes are moist. Posterior pharynx clear of any exudate or lesions. Normal dentition.  Neck: Appears normal, supple, no cervical masses, normal ROM, no appreciable thyromegaly; No JVD Respiratory: Diminished to auscultation bilaterally, no wheezing, rales, rhonchi or  crackles. Normal respiratory effort and patient is not tachypenic. No accessory muscle use.  Cardiovascular: Irregularly Irregular and tachycardic, Has a murmur.. S1 and S2 auscultated. No extremity edema. 2+ pedal pulses. No carotid bruits.  Abdomen: Soft, non-tender, non-distended. No masses palpated. No appreciable hepatosplenomegaly. Bowel sounds positive x4.  GU: Deferred. Musculoskeletal: No clubbing / cyanosis of digits/nails.  Normal strength and muscle tone.  Skin: No rashes, lesions, ulcers on a limited skin evaluation. No induration; Warm and dry.  Neurologic: CN 2-12 grossly intact with no focal deficits. Romberg sign and cerebellar reflexes not assessed.  Psychiatric: Normal judgment and insight. Alert and oriented x 3. Normal mood and appropriate affect.   Data Reviewed: I have personally reviewed following labs and imaging studies  CBC: Recent Labs  Lab 10/25/18 0943 10/26/18 0518  WBC 12.7* 12.6*  NEUTROABS 9.9*  --   HGB 12.2 12.1  HCT 38.5 38.0  MCV 90.6 90.0  PLT 350 409   Basic Metabolic Panel: Recent Labs  Lab 10/25/18 0943 10/26/18 0518  NA 132* 134*  K 4.5 4.0  CL 98 99  CO2 22 24  GLUCOSE 109* 93  BUN 16 19  CREATININE 1.01* 1.03*  CALCIUM 9.0 8.9   GFR: Estimated Creatinine Clearance: 33.9 mL/min (A) (by C-G formula based on SCr of 1.03 mg/dL (H)). Liver Function Tests: Recent Labs  Lab 10/25/18 0943  AST 32  ALT 30  ALKPHOS 162*  BILITOT 1.0  PROT 6.6  ALBUMIN 3.6   No results for input(s): LIPASE, AMYLASE in the last 168 hours. No results for input(s): AMMONIA in the last 168 hours. Coagulation Profile: No results for input(s): INR, PROTIME in the last 168 hours. Cardiac Enzymes: Recent Labs  Lab 10/25/18 1729 10/25/18 2307 10/26/18 0518  TROPONINI <0.03 <0.03 <0.03   BNP (last 3 results) No results for input(s): PROBNP in the last 8760 hours. HbA1C: No results for input(s): HGBA1C in the last 72 hours. CBG: No results for  input(s): GLUCAP in the last 168 hours. Lipid Profile: No results for input(s): CHOL, HDL, LDLCALC, TRIG, CHOLHDL, LDLDIRECT in the last 72 hours. Thyroid Function Tests: No results for input(s): TSH, T4TOTAL, FREET4, T3FREE, THYROIDAB in the last 72 hours. Anemia Panel: No results for input(s): VITAMINB12, FOLATE, FERRITIN, TIBC, IRON, RETICCTPCT in the last 72 hours. Sepsis Labs: No results for input(s): PROCALCITON, LATICACIDVEN in the last 168 hours.  Recent Results (from the past 240 hour(s))  MRSA PCR Screening     Status: None   Collection Time: 10/25/18 12:58 PM  Result Value Ref Range Status   MRSA by PCR NEGATIVE NEGATIVE Final    Comment:        The GeneXpert MRSA Assay (FDA approved for NASAL specimens only), is one component of a comprehensive MRSA colonization surveillance program. It is not intended to diagnose MRSA infection nor to guide or monitor treatment for MRSA infections. Performed at [facility], [place] [address], [place], [place] [address]     Radiology Studies: Dg Chest 2 View  Result Date: 10/25/2018 CLINICAL DATA:  Shortness  of breath. EXAM: CHEST - 2 VIEW COMPARISON:  None. FINDINGS: The heart is enlarged. Aortic atherosclerosis is present. A mild interstitial pattern present. Some of this is likely chronic. Small effusions are present. Bibasilar airspace disease likely reflects atelectasis. IMPRESSION: 1. Cardiomegaly with mild edema and bilateral effusions suggesting congestive heart failure. 2. Bibasilar airspace disease likely reflects atelectasis, left greater than right. 3. Aortic atherosclerosis. Electronically Signed   By: San Morelle M.D.   On: 10/25/2018 09:27   Scheduled Meds: . apixaban  5 mg Oral BID  . chlorhexidine  15 mL Mouth Rinse BID  . dronedarone  400 mg Oral BID WC  . furosemide  40 mg Intravenous BID  . gabapentin  100 mg Oral BID  . mouth rinse  15 mL Mouth Rinse q12n4p  . potassium chloride  20  mEq Oral BID  . sodium chloride flush  3 mL Intravenous Q12H   Continuous Infusions: . sodium chloride    . cefTRIAXone (ROCEPHIN)  IV Stopped (10/25/18 2248)  . diltiazem (CARDIZEM) infusion 5 mg/hr (10/25/18 2309)     LOS: 0 days   Kerney Elbe, DO [facility] PAGER is on [place]  If 7PM-7AM, please contact night-coverage [email or web address] Password [email or web address] 10/26/2018, 7:27 AM

## 2018-10-27 DIAGNOSIS — I4891 Unspecified atrial fibrillation: Secondary | ICD-10-CM

## 2018-10-27 LAB — COMPREHENSIVE METABOLIC PANEL
ALK PHOS: 139 U/L — AB (ref 38–126)
ALT: 22 U/L (ref 0–44)
AST: 28 U/L (ref 15–41)
Albumin: 3.1 g/dL — ABNORMAL LOW (ref 3.5–5.0)
Anion gap: 12 (ref 5–15)
BILIRUBIN TOTAL: 0.6 mg/dL (ref 0.3–1.2)
BUN: 25 mg/dL — ABNORMAL HIGH (ref 8–23)
CALCIUM: 8.8 mg/dL — AB (ref 8.9–10.3)
CHLORIDE: 97 mmol/L — AB (ref 98–111)
CO2: 24 mmol/L (ref 22–32)
CREATININE: 1.2 mg/dL — AB (ref 0.44–1.00)
GFR, EST AFRICAN AMERICAN: 46 mL/min — AB (ref >60)
GFR, EST NON AFRICAN AMERICAN: 40 mL/min — AB (ref >60)
Glucose, Bld: 119 mg/dL — ABNORMAL HIGH (ref 70–99)
Potassium: 4.3 mmol/L (ref 3.5–5.1)
Sodium: 133 mmol/L — ABNORMAL LOW (ref 135–145)
TOTAL PROTEIN: 6.2 g/dL — AB (ref 6.5–8.1)

## 2018-10-27 LAB — CBC WITH DIFFERENTIAL/PLATELET
ABS IMMATURE GRANULOCYTES: 0.07 10*3/uL (ref 0.00–0.07)
BASOS PCT: 1 %
Basophils Absolute: 0.1 10*3/uL (ref 0.0–0.1)
EOS ABS: 0.4 10*3/uL (ref 0.0–0.5)
Eosinophils Relative: 3 %
HEMATOCRIT: 37.4 % (ref 36.0–46.0)
Hemoglobin: 11.6 g/dL — ABNORMAL LOW (ref 12.0–15.0)
Immature Granulocytes: 1 %
Lymphocytes Relative: 10 %
Lymphs Abs: 1.4 10*3/uL (ref 0.7–4.0)
MCH: 28.9 pg (ref 26.0–34.0)
MCHC: 31 g/dL (ref 30.0–36.0)
MCV: 93.3 fL (ref 80.0–100.0)
MONO ABS: 1.4 10*3/uL — AB (ref 0.1–1.0)
Monocytes Relative: 11 %
NEUTROS ABS: 10.1 10*3/uL — AB (ref 1.7–7.7)
NEUTROS PCT: 74 %
NRBC: 0 % (ref 0.0–0.2)
Platelets: 341 10*3/uL (ref 150–400)
RBC: 4.01 MIL/uL (ref 3.87–5.11)
RDW: 17 % — ABNORMAL HIGH (ref 11.5–15.5)
WBC: 13.5 10*3/uL — AB (ref 4.0–10.5)

## 2018-10-27 LAB — URINE CULTURE: Culture: >=100000 — AB

## 2018-10-27 LAB — MAGNESIUM: MAGNESIUM: 1.7 mg/dL (ref 1.7–2.4)

## 2018-10-27 LAB — PHOSPHORUS: PHOSPHORUS: 4.9 mg/dL — AB (ref 2.5–4.6)

## 2018-10-27 MED ORDER — FUROSEMIDE 10 MG/ML IJ SOLN
40.0000 mg | Freq: Every day | INTRAMUSCULAR | Status: DC
  Administered 2018-10-28: 40 mg via INTRAVENOUS
  Filled 2018-10-27: qty 4

## 2018-10-27 MED ORDER — DILTIAZEM HCL 60 MG PO TABS
60.0000 mg | ORAL_TABLET | Freq: Four times a day (QID) | ORAL | Status: DC
  Administered 2018-10-27 – 2018-10-30 (×12): 60 mg via ORAL
  Filled 2018-10-27 (×12): qty 1

## 2018-10-27 MED ORDER — CEFAZOLIN SODIUM-DEXTROSE 1-4 GM/50ML-% IV SOLN
1.0000 g | Freq: Two times a day (BID) | INTRAVENOUS | Status: DC
  Administered 2018-10-27 – 2018-11-01 (×10): 1 g via INTRAVENOUS
  Filled 2018-10-27 (×13): qty 50

## 2018-10-27 NOTE — Progress Notes (Addendum)
Progress Note  Patient Name: Nicole Barker Date of Encounter: 10/27/2018  Primary Cardiologist: Kirk Ruths, MD   Subjective   Resting comfortably in bed. When I walked into exam room, she was in supine position, completely flat. Normal breathing. She denies dyspnea this morning. No CP or palpitations.   Inpatient Medications    Scheduled Meds: . apixaban  5 mg Oral BID  . chlorhexidine  15 mL Mouth Rinse BID  . furosemide  40 mg Intravenous BID  . gabapentin  100 mg Oral BID  . mouth rinse  15 mL Mouth Rinse q12n4p  . metoprolol tartrate  25 mg Oral BID  . polyethylene glycol  17 g Oral BID  . potassium chloride  20 mEq Oral BID  . senna-docusate  1 tablet Oral BID  . sodium chloride flush  3 mL Intravenous Q12H   Continuous Infusions: . sodium chloride    .  ceFAZolin (ANCEF) IV    . diltiazem (CARDIZEM) infusion 5 mg/hr (10/27/18 0119)   PRN Meds: sodium chloride, acetaminophen, hydrALAZINE, ondansetron (ZOFRAN) IV, sodium chloride flush, traMADol   Vital Signs    Vitals:   10/27/18 0443 10/27/18 0500 10/27/18 0800 10/27/18 1000  BP:  100/63 127/86 122/87  Pulse:  91 (!) 116 (!) 59  Resp:  17 (!) 34 (!) 30  Temp: 97.8 F (36.6 C)  98.3 F (36.8 C)   TempSrc: Oral  Oral   SpO2:  97% 96% 96%  Weight:  72 kg    Height:        Intake/Output Summary (Last 24 hours) at 10/27/2018 1022 Last data filed at 10/27/2018 0918 Gross per 24 hour  Intake 501.71 ml  Output 600 ml  Net -98.29 ml   Filed Weights   10/25/18 1304 10/26/18 0343 10/27/18 0500  Weight: 71.9 kg 72.2 kg 72 kg    Telemetry    Atrial fibrillation 110s- Personally Reviewed  ECG    Not performed today- Personally Reviewed  Physical Exam   GEN: Elderly WF, in No acute distress.   Neck: No JVD Cardiac: irregularly irregular rhythm. Tachy rate Respiratory: Clear to auscultation bilaterally. GI: Soft, nontender, non-distended  MS: No edema; No deformity. Neuro:  Nonfocal    Psych: Normal affect   Labs    Chemistry Recent Labs  Lab 10/25/18 0943 10/26/18 0518 10/27/18 0302  NA 132* 134* 133*  K 4.5 4.0 4.3  CL 98 99 97*  CO2 _0 GLUCOSE 109* 93 119*  BUN 16 19 25*  CREATININE 1.01* 1.03* 1.20*  CALCIUM 9.0 8.9 8.8*  PROT 6.6  --  6.2*  ALBUMIN 3.6  --  3.1*  AST 32  --  28  ALT 30  --  22  ALKPHOS 162*  --  139*  BILITOT 1.0  --  0.6  GFRNONAA 49* 48* 40*  GFRAA 57* 55* 46*  ANIONGAP _1 Hematology Recent Labs  Lab 10/25/18 0943 10/26/18 0518 10/27/18 0302  WBC 12.7* 12.6* 13.5*  RBC 4.25 4.22 4.01  HGB 12.2 12.1 11.6*  HCT 38.5 38.0 37.4  MCV 90.6 90.0 93.3  MCH 28.7 28.7 28.9  MCHC 31.7 31.8 31.0  RDW 16.6* 16.7* 17.0*  PLT 350 342 341    Cardiac Enzymes Recent Labs  Lab 10/25/18 1729 10/25/18 2307 10/26/18 0518  TROPONINI <0.03 <0.03 <0.03    Recent Labs  Lab 10/25/18 0949  TROPIPOC 0.00     BNP Recent Labs  Lab 10/25/18 0943  BNP 1,010.4*     DDimer No results for input(s): DDIMER in the last 168 hours.   Radiology    No results found.  Cardiac Studies   2D Echo 10/25/18 Study Conclusions  - Left ventricle: The cavity size was normal. Wall thickness was increased in a pattern of mild LVH. Systolic function was mildly reduced. The estimated ejection fraction was in the range of 45% to 50%. Diffuse hypokinesis. Doppler parameters are consistent with high ventricular filling pressure. - Aortic valve: Valve mobility was restricted. There was mild stenosis. There was mild regurgitation. - Mitral valve: Calcified annulus. The findings are consistent with mild stenosis. There was moderate regurgitation. - Left atrium: The atrium was mildly dilated. - Tricuspid valve: There was moderate regurgitation. - Pulmonary arteries: Systolic pressure was moderately to severely increased. PA peak pressure: 65 mm Hg (S). - Pericardium, extracardiac: There was a left pleural  effusion.  Impressions:  - Mild global reduction in LV systolic function; mild LVH; calcified aortic valve with mild AS (mean gradient 15 mmHg) and mild AI; MAC with mild MS (mean gradient 5 mmHg); moderate MR; mild LAE; moderate TR; moderate to severe pulmonary hypertension.  Patient Profile   Nicole Barker is a 84 y.o. female, recently moved from [place] to be closer to family, with a reported hx of heart failure, atrial fibrillation, chronic anticoagulation, HTN, HLD, dementia and asthma who is being seen today for the evaluation of acute combined systolic and diastolic heart failure and atrial fibrillation w/ RVR, at the request of Dr. Lonny Prude, Internal Medicine.   Update: I personally contacted pt's daughter, [name], by phone regarding [place] and cardiology records. She confirmed her mother's dementia and reports that she is very confused. She actually did not just move from [facility] 2 weeks ago as noted in H&P. She moved to [place] over 1 year ago and was living in [place] w/ her daughter. Her daughter reports she had cardiac conditions while living in [place] but does not know the cardiologist or clinic she was being followed by.  She does state that in Sep 2018 she was admitted to [facility] in [place] and had an echo that was abnormal and she was told about CHF. She was also in afib in 2018 and plans were for a catheter ablation, but she spontaneously converted back to NSR and procedure was canceled. Pt now resides at [facility] here in [place]. Her daughter believes that she has been getting her Eliquis daily but I have asked RN to contact SNF to check to confirm that there has been no missed doses of Eliquis in the past 4 weeks, as we may need to plan DCCV. They will try to fax [facility] to [place] for review. Pt's daughter also mentions that she believed that her mother was being seen occasionally by an in house cardiologist at [facility], but unsure of name.   Assessment & Plan    1. Acute on  Chronic Combined Systolic and Diastolic CHF: responding well to IV lasix w/ an additional 2.4L out yesterday. She denies dyspnea. When I walked into exam room, she was in supine position, completely flat. Normal breathing. No edema on exam. She has had a slight bump in SCr, up from 1.0>>1.20. Can likely transition to PO Lasix later today and recheck BMP in the AM.   2. Atrial Fibrillation: duration known. Was being treated for this prior to admission and was previously on Multaq but we discontinued this 11/6  due to systolic HF. She remains in afib w/ RVR in the 120s but asymptomatic. She remains on Eliquis 5 mg BID but we will check with SNF to confirm that she has not missed any doses in the last 4 weeks, as we may consider DCCV. For now, continue metoprolol for rate control. May also consider addition of amiodarone.   3. Cardiomyopathy: duration of HF unknown. Reported in [place]. Echo this admit shows midly reduced LVEF at 45-50% w/ diffuse hypokinesis.  blocker was added 11/6, metoprolol tartrate 25 mg BID. Tolerating ok. Plan was to try adding back her home Losartan as BP allows, but she has had a slight bump in SCr from 1.0>>1.2. Will continue to monitor renal function and will try resuming ARB if renal function stabilizes.   4. Valvular Heart Disease: Echo 11/5 showed Mild AS, mild AI, mild MS, moderate MR, moderate TR. She will need follow-up echoes in the future.  Conservative measures given age and overall medical problems.  5. HTN: normotensive this morning at 127/86. Continue to monitor.   6. Dementia: management per primary.    7. Renal Insuffiencey: slightly bump in SCr overnight from 1.03>>1.20 w/ diuresis. Home ARB remains on hold. Continue to monitor renal function closely.    For questions or updates, please contact [place] Please consult [email or web address] for contact info under        Signed, Lyda Jester, PA-C  10/27/2018, 10:22 AM   See progress note later same  day. Kirk Ruths, MD

## 2018-10-27 NOTE — Progress Notes (Signed)
PROGRESS NOTE    Nicole Barker  FYB:[id number] DOB: Aug 11, 1930 DOA: 10/25/2018 PCP: Joya San, MD   Brief Narrative:  HPI per Dr. Cordelia Poche on 10/25/18 HPI: Nicole Barker is a 84 y.o. female with medical history significant of heart failure, atrial fibrillation, dementia, hypertension, hyperlipidemia. Patient unable to provide history. She is unsure of why she is here. She does state that she is having shortness of breath of unknown time frame. She also has some frequency.  ED Course: Vitals: Afebrile, tachycardia, tachypnea, hypertensive, on room air Labs: sodium of 132, creatinine of 1.01, alkaline phosphatase of 162, BNP of 1010, WBC of 12.7 Imaging: Chest x-ray significant for cardiomegaly, edema, bilateral effusion, atelectasis Medications/Course: Albuterol, lasix, lopressor, given  **Patient feels improved from yesterday and is not as dyspneic.  Denies any nausea or vomiting.  No other concerns or complaints at this time.  Cardiology reducing her diuresis currently given her slight bump in creatinine  Assessment & Plan:   Principal Problem:   Acute heart failure (HCC) Active Problems:   Atrial fibrillation with RVR (HCC)   Dementia without behavioral disturbance (HCC)   Essential hypertension   Hyperlipidemia   AKI (acute kidney injury) ([place])  Acute Respiratory Failure with Hypoxia in the setting of Acute Decompensated Combined Systolic and Diastolic CHF with an EF of 45-50% -Continue supplemental oxygen via nasal cannula and wean O2 as tolerated -Continuous pulse oximetry and maintain O2 saturations greater than 92% -Initial chest x-ray showed cardiomegaly with mild edema and bilateral effusions suggesting congestive heart failure and bibasilar airspace disease that reflected atelectasis greater on the left than the right -Continue with diuresis but at a reduced dose at 40 mg IV daily -We will need home Ambulatory screen prior to discharge  Acute  on Chronic combined systolic and diastolic CHF with an EF of 40 to 45% -On lasix, losartan and potassium as an outpatient. -BNP on Admission was 1,010.4  -Troponin negative x3 No chest pain.  -Weight on admission of 145 lbs -C/w Lasix 40 mg IV BID with potassium supplementation -Strict in and out/daily weights -Watch kidney function while being diuresed -Transthoracic Echocardiogram as below -Oxygen as needed to keep O2 >94% -Continue to Hold losartan until creatinine improves or appears stable -Patient is -2.261 L since admission; however question of weights are accurate and his weight has gone up to 158 -Continue diuresis as above as patient remains volume overloaded with a reduced dose of IV 40 mg daily given the bump in creatinine -Continue to monitor volume status daily -Cardiology consulted for further evaluation recommendations and patient started on metoprolol 25 mg p.o. twice daily we will continue  Atrial fibrillation with RVR -Improved with metoprolol in ED. Possibly contributed to acute heart failure -Stopped Dronedarone as contraindicated in patient with CHF but will continue with apixaban for anticoagulation -Continue with telemetry -Cardiology evaluated and added metoprolol 25 mill grams p.o. twice daily -Patient remained on Cardizem drip for rate control and cardiology change IV Cardizem to p.o. Cardizem 60 mg by mouth every 6 hours.  Cardiology still considering adding amiodarone if the heart rate is difficult to control blood pressure low -Per cardiology if she is symptomatic despite being rate controlled will need to consider adding amiodarone and proceeding with cardioversion as well as continue with apixaban for anticoagulation -We will need to obtain outside records to see if she has been in sinus recently and she has may be reasonable to proceed with cardioversion however this will need to be verified  -  CHA2DS2-VASc is elevated at 5  Acute kidney injury but suspect  Chronic Kidney Disease -No baseline. Likely in setting of heart failure. CrCl of 29.1 -C/w IV Lasix but at a reduced dose of IV 40 mg Daily  -Adjust medications for decreased function -BUN/Cr went from 12/1.01 -> 19/1.03 -> 25/1.20 -Continue to Monitor and Trend Renal Function -Repeat CMP in AM   Urinary frequency with E Coli UTI -Urinalysis showed clear urine small hemoglobin, small leukocytes, positive nitrites, many bacteria, 0-5 WBCs -Urine culture showed greater than 100,000 colonies gram-negative rods and it was E Coli that was Pan-sensitive -Changed empiric IV Ceftriaxone to Cefazolin to Narrow Treatment   Dementia -Does not appear to be on medications. Currently resides at [facility] but states she lives with her daughter. -Social work consulted   Essential Hypertension Uncontrolled in setting of fluid overload. -Hold Losartan for now and cardiology has added Metoprolol 25 mg p.o. twice daily -Lasix as above -Cardiology Starting patient on po Cardizem   Hyponatremia -In setting of volume overload -Continue with diuresis and sodium is now 133 -Continue monitor and repeat CMP in a.m.  Valvular Heart Disease -Seen on ECHO -Cardiology following and recommending conservative measures   Obesity -Estimated body mass index is 32.06 kg/m as calculated from the following:   Height as of this encounter: _0  (1.499 m).   Weight as of this encounter: 72 kg. -Weight Loss Counseling  Leukocytosis -Patient is Afebrile but WBC is 13.5 and increased from 12.6 -? Hemoconcentration in the setting of Lasix Use -Not on Any steroids -Urine Cx + for E Coli -No Blood Cx's done -CXR not indicative of PNA -Check CXR in AM -Will obtain Blood Cx's  -Continue to Monitor WBC Trend -Repeat CBC in AM   Obesity -Estimated body mass index is 32.06 kg/m as calculated from the following:   Height as of this encounter: _1  (1.499 m).   Weight as of this encounter: 72  kg. -Weight Loss Counseling given  DVT prophylaxis: Anticoagulated with Apixaban  Code Status: Deferred Family Communication: No family present at bedside Disposition Plan: Pending PT Evaluation  Consultants:  Cardiology   Procedures:  ECHOCARDIOGRAM 10/25/18 ------------------------------------------------------------------- Study Conclusions  - Left ventricle: The cavity size was normal. Wall thickness was   increased in a pattern of mild LVH. Systolic function was mildly   reduced. The estimated ejection fraction was in the range of 45%   to 50%. Diffuse hypokinesis. Doppler parameters are consistent   with high ventricular filling pressure. - Aortic valve: Valve mobility was restricted. There was mild   stenosis. There was mild regurgitation. - Mitral valve: Calcified annulus. The findings are consistent with   mild stenosis. There was moderate regurgitation. - Left atrium: The atrium was mildly dilated. - Tricuspid valve: There was moderate regurgitation. - Pulmonary arteries: Systolic pressure was moderately to severely   increased. PA peak pressure: 65 mm Hg (S). - Pericardium, extracardiac: There was a left pleural effusion.  Impressions:  - Mild global reduction in LV systolic function; mild LVH;   calcified aortic valve with mild AS (mean gradient 15 mmHg) and   mild AI; MAC with mild MS (mean gradient 5 mmHg); moderate MR;   mild LAE; moderate TR; moderate to severe pulmonary hypertension   Antimicrobials:  Anti-infectives (From admission, onward)   Start     Dose/Rate Route Frequency Ordered Stop   10/27/18 2200  ceFAZolin (ANCEF) IVPB 1 g/50 mL premix     1 g  100 mL/hr over 30 Minutes Intravenous Every 12 hours 10/27/18 0907     10/25/18 2200  cefTRIAXone (ROCEPHIN) 1 g in sodium chloride 0.9 % 100 mL IVPB  Status:  Discontinued     1 g 200 mL/hr over 30 Minutes Intravenous Every 24 hours 10/25/18 2144 10/27/18 9562     Subjective: Seen and examined  at bedside and was doing well.  Denying chest pain, lightheadedness or dizziness.  No nausea or vomiting.  No other concerns or complaints at this time.  Not as dyspneic and oxygen requirement is weaning.  Heart rate is still irregularly irregular and slightly tachycardic.  Objective: Vitals:   10/27/18 0800 10/27/18 1000 10/27/18 1200 10/27/18 1400  BP: 127/86 122/87  124/82  Pulse: (!) 116 (!) 59  (!) 102  Resp: (!) 34 (!) 30  (!) 28  Temp: 98.3 F (36.8 C)  98.2 F (36.8 C)   TempSrc: Oral  Oral   SpO2: 96% 96%  98%  Weight:      Height:        Intake/Output Summary (Last 24 hours) at 10/27/2018 1634 Last data filed at 10/27/2018 1600 Gross per 24 hour  Intake 470.21 ml  Output 600 ml  Net -129.79 ml   Filed Weights   10/25/18 1304 10/26/18 0343 10/27/18 0500  Weight: 71.9 kg 72.2 kg 72 kg   Examination: Physical Exam:  Constitutional: Well-nourished, well-developed obese pleasantly demented Caucasian female currently no acute distress appears calm and comfortable Eyes: Sclerae anicteric.  Lids and conjunctive are normal ENMT: External ears and nose appear normal.  Mucous members are moist.  Neck: Appears supple with no JVD Respiratory: Diminished to auscultation bilaterally no appreciable wheezing, rales, rhonchi.  Patient no accessory muscles to breathe and was wearing supplemental oxygen via nasal cannula Cardiovascular: Irregularly irregular and tachycardic.  Has a murmur noted.  Trace lower extremity edema Abdomen: Soft, nontender, slightly distended second body habitus.  Bowel sounds present all 4 quadrants GU: Deferred Musculoskeletal: No contractures or cyanosis noted.  No joint deformities Skin: Is warm and dry no appreciable rashes or lesions on limited skin evaluation. Neurologic: Cranial nerves II through XII grossly intact no appreciable focal deficits Psychiatric: Normal mood and affect.  Intact judgment insight.  Patient awake and alert and oriented  x3  Data Reviewed: I have personally reviewed following labs and imaging studies  CBC: Recent Labs  Lab 10/25/18 0943 10/26/18 0518 10/27/18 0302  WBC 12.7* 12.6* 13.5*  NEUTROABS 9.9*  --  10.1*  HGB 12.2 12.1 11.6*  HCT 38.5 38.0 37.4  MCV 90.6 90.0 93.3  PLT 350 342 130   Basic Metabolic Panel: Recent Labs  Lab 10/25/18 0943 10/26/18 0518 10/27/18 0302  NA 132* 134* 133*  K 4.5 4.0 4.3  CL 98 99 97*  CO2 _0 GLUCOSE 109* 93 119*  BUN 16 19 25*  CREATININE 1.01* 1.03* 1.20*  CALCIUM 9.0 8.9 8.8*  MG  --   --  1.7  PHOS  --   --  4.9*   GFR: Estimated Creatinine Clearance: 29.1 mL/min (A) (by C-G formula based on SCr of 1.2 mg/dL (H)). Liver Function Tests: Recent Labs  Lab 10/25/18 0943 10/27/18 0302  AST 32 28  ALT 30 22  ALKPHOS 162* 139*  BILITOT 1.0 0.6  PROT 6.6 6.2*  ALBUMIN 3.6 3.1*   No results for input(s): LIPASE, AMYLASE in the last 168 hours. No results for input(s): AMMONIA in the last 168  hours. Coagulation Profile: No results for input(s): INR, PROTIME in the last 168 hours. Cardiac Enzymes: Recent Labs  Lab 10/25/18 1729 10/25/18 2307 10/26/18 0518  TROPONINI <0.03 <0.03 <0.03   BNP (last 3 results) No results for input(s): PROBNP in the last 8760 hours. HbA1C: No results for input(s): HGBA1C in the last 72 hours. CBG: No results for input(s): GLUCAP in the last 168 hours. Lipid Profile: No results for input(s): CHOL, HDL, LDLCALC, TRIG, CHOLHDL, LDLDIRECT in the last 72 hours. Thyroid Function Tests: No results for input(s): TSH, T4TOTAL, FREET4, T3FREE, THYROIDAB in the last 72 hours. Anemia Panel: No results for input(s): VITAMINB12, FOLATE, FERRITIN, TIBC, IRON, RETICCTPCT in the last 72 hours. Sepsis Labs: No results for input(s): PROCALCITON, LATICACIDVEN in the last 168 hours.  Recent Results (from the past 240 hour(s))  Culture, Urine     Status: Abnormal   Collection Time: 10/25/18 12:08 PM  Result Value  Ref Range Status   Specimen Description   Final    URINE, RANDOM Performed at [facility] [address], [place], [place] [address]    Special Requests   Final    NONE Performed at [facility], [employer] [address] [place], [place] [address]    Culture >=100,000 COLONIES/mL ESCHERICHIA COLI (A)  Final   Report Status 10/27/2018 FINAL  Final   Organism ID, Bacteria ESCHERICHIA COLI (A)  Final      Susceptibility   Escherichia coli - MIC*    AMPICILLIN 8 SENSITIVE Sensitive     CEFAZOLIN <=4 SENSITIVE Sensitive     CEFTRIAXONE <=1 SENSITIVE Sensitive     CIPROFLOXACIN <=0.25 SENSITIVE Sensitive     GENTAMICIN <=1 SENSITIVE Sensitive     IMIPENEM <=0.25 SENSITIVE Sensitive     NITROFURANTOIN <=16 SENSITIVE Sensitive     TRIMETH/SULFA <=20 SENSITIVE Sensitive     AMPICILLIN/SULBACTAM <=2 SENSITIVE Sensitive     PIP/TAZO <=4 SENSITIVE Sensitive     Extended ESBL NEGATIVE Sensitive     * >=100,000 COLONIES/mL ESCHERICHIA COLI  MRSA PCR Screening     Status: None   Collection Time: 10/25/18 12:58 PM  Result Value Ref Range Status   MRSA by PCR NEGATIVE NEGATIVE Final    Comment:        The GeneXpert MRSA Assay (FDA approved for NASAL specimens only), is one component of a comprehensive MRSA colonization surveillance program. It is not intended to diagnose MRSA infection nor to guide or monitor treatment for MRSA infections. Performed at [facility], [place] [address] [place], [place] [address]     Radiology Studies: No results found. Scheduled Meds: . apixaban  5 mg Oral BID  . chlorhexidine  15 mL Mouth Rinse BID  . diltiazem  60 mg Oral Q6H  . [START ON 10/28/2018] furosemide  40 mg Intravenous Daily  . gabapentin  100 mg Oral BID  . mouth rinse  15 mL Mouth Rinse q12n4p  . metoprolol tartrate  25 mg Oral BID  . polyethylene glycol  17 g Oral BID  . potassium chloride  20 mEq Oral BID  . senna-docusate  1 tablet  Oral BID  . sodium chloride flush  3 mL Intravenous Q12H   Continuous Infusions: . sodium chloride    .  ceFAZolin (ANCEF) IV      LOS: 1 day   Kerney Elbe, DO [facility] PAGER is on AMION  If 7PM-7AM, please contact night-coverage [email or web address] Password [facility] 10/27/2018, 4:34 PM

## 2018-10-27 NOTE — Progress Notes (Signed)
Placed a call to [facility] requesting medical records specifically to track compliance with Eliquis for past 1 mth.(per Cardiology request). RN was asked to contact pt. provider. Left VM with Dr. Durwin Reges [phone].

## 2018-10-27 NOTE — Progress Notes (Signed)
Spoke with nurse at Dr. Durwin Reges office regarding patients Eliquis and the  nurse reports per [facility] that she has not missed a dose in the past month.

## 2018-10-27 NOTE — Progress Notes (Signed)
Progress Note  Patient Name: Nicole Barker Date of Encounter: 10/27/2018  Primary Cardiologist: Kirk Ruths, MD   Subjective   Resting comfortably in bed. When I walked into exam room, she was in supine position, completely flat. Normal breathing. She denies dyspnea this morning. No CP or palpitations.   Inpatient Medications    Scheduled Meds: . apixaban  5 mg Oral BID  . chlorhexidine  15 mL Mouth Rinse BID  . furosemide  40 mg Intravenous BID  . gabapentin  100 mg Oral BID  . mouth rinse  15 mL Mouth Rinse q12n4p  . metoprolol tartrate  25 mg Oral BID  . polyethylene glycol  17 g Oral BID  . potassium chloride  20 mEq Oral BID  . senna-docusate  1 tablet Oral BID  . sodium chloride flush  3 mL Intravenous Q12H   Continuous Infusions: . sodium chloride    .  ceFAZolin (ANCEF) IV    . diltiazem (CARDIZEM) infusion 5 mg/hr (10/27/18 0119)   PRN Meds: sodium chloride, acetaminophen, hydrALAZINE, ondansetron (ZOFRAN) IV, sodium chloride flush, traMADol   Vital Signs    Vitals:   10/27/18 0443 10/27/18 0500 10/27/18 0800 10/27/18 1000  BP:  100/63 127/86 122/87  Pulse:  91 (!) 116 (!) 59  Resp:  17 (!) 34 (!) 30  Temp: 97.8 F (36.6 C)  98.3 F (36.8 C)   TempSrc: Oral  Oral   SpO2:  97% 96% 96%  Weight:  72 kg    Height:        Intake/Output Summary (Last 24 hours) at 10/27/2018 1043 Last data filed at 10/27/2018 0918 Gross per 24 hour  Intake 501.71 ml  Output 600 ml  Net -98.29 ml   Filed Weights   10/25/18 1304 10/26/18 0343 10/27/18 0500  Weight: 71.9 kg 72.2 kg 72 kg    Telemetry    Atrial fibrillation 110s- Personally Reviewed  ECG    Not performed today- Personally Reviewed  Physical Exam   GEN: Elderly WF, in No acute distress.   Neck: No JVD Cardiac: irregularly irregular rhythm. Tachy rate Respiratory: Clear to auscultation bilaterally. GI: Soft, nontender, non-distended  MS: No edema; No deformity. Neuro:  Nonfocal    Psych: Normal affect   Labs    Chemistry Recent Labs  Lab 10/25/18 0943 10/26/18 0518 10/27/18 0302  NA 132* 134* 133*  K 4.5 4.0 4.3  CL 98 99 97*  CO2 22 24 24   GLUCOSE 109* 93 119*  BUN 16 19 25*  CREATININE 1.01* 1.03* 1.20*  CALCIUM 9.0 8.9 8.8*  PROT 6.6  --  6.2*  ALBUMIN 3.6  --  3.1*  AST 32  --  28  ALT 30  --  22  ALKPHOS 162*  --  139*  BILITOT 1.0  --  0.6  GFRNONAA 49* 48* 40*  GFRAA 57* 55* 46*  ANIONGAP 12 11 12      Hematology Recent Labs  Lab 10/25/18 0943 10/26/18 0518 10/27/18 0302  WBC 12.7* 12.6* 13.5*  RBC 4.25 4.22 4.01  HGB 12.2 12.1 11.6*  HCT 38.5 38.0 37.4  MCV 90.6 90.0 93.3  MCH 28.7 28.7 28.9  MCHC 31.7 31.8 31.0  RDW 16.6* 16.7* 17.0*  PLT 350 342 341    Cardiac Enzymes Recent Labs  Lab 10/25/18 1729 10/25/18 2307 10/26/18 0518  TROPONINI <0.03 <0.03 <0.03    Recent Labs  Lab 10/25/18 0949  TROPIPOC 0.00     BNP Recent Labs  Lab 10/25/18 0943  BNP 1,010.4*     DDimer No results for input(s): DDIMER in the last 168 hours.   Radiology    No results found.  Cardiac Studies   2D Echo 10/25/18 Study Conclusions  - Left ventricle: The cavity size was normal. Wall thickness was increased in a pattern of mild LVH. Systolic function was mildly reduced. The estimated ejection fraction was in the range of 45% to 50%. Diffuse hypokinesis. Doppler parameters are consistent with high ventricular filling pressure. - Aortic valve: Valve mobility was restricted. There was mild stenosis. There was mild regurgitation. - Mitral valve: Calcified annulus. The findings are consistent with mild stenosis. There was moderate regurgitation. - Left atrium: The atrium was mildly dilated. - Tricuspid valve: There was moderate regurgitation. - Pulmonary arteries: Systolic pressure was moderately to severely increased. PA peak pressure: 65 mm Hg (S). - Pericardium, extracardiac: There was a left pleural  effusion.  Impressions:  - Mild global reduction in LV systolic function; mild LVH; calcified aortic valve with mild AS (mean gradient 15 mmHg) and mild AI; MAC with mild MS (mean gradient 5 mmHg); moderate MR; mild LAE; moderate TR; moderate to severe pulmonary hypertension.  Patient Profile   Nicole Barker is a 84 y.o. female, recently moved from [place] to be closer to family, with a reported hx of heart failure, atrial fibrillation, chronic anticoagulation, HTN, HLD, dementia and asthma who is being seen today for the evaluation of acute combined systolic and diastolic heart failure and atrial fibrillation w/ RVR, at the request of Dr. Lonny Prude, Internal Medicine.   Update: I personally contacted pt's daughter, [name], by phone regarding [place] and cardiology records. She confirmed her mother's dementia and reports that she is very confused. She actually did not just move from [facility] 2 weeks ago as noted in H&P. She moved to [place] over 1 year ago and was living in [place] w/ her daughter. Her daughter reports she had cardiac conditions while living in [place] but does not know the cardiologist or clinic she was being followed by.  She does state that in Sep 2018 she was admitted to [facility] [place] and had an echo that was abnormal and she was told about CHF. She was also in afib in 2018 and plans were for a catheter ablation, but she spontaneously converted back to NSR and procedure was canceled. Pt now resides at [facility] here in [place]. Her daughter believes that she has been getting her Eliquis daily but I have asked RN to contact SNF to check to confirm that there has been no missed doses of Eliquis in the past 4 weeks, as we may need to plan DCCV. They will try to fax [facility] to [place] for review. Pt's daughter also mentions that she believed that her mother was being seen occasionally by an in house cardiologist at [facility], but unsure of name.   Assessment & Plan    1. Acute on  Chronic Combined Systolic and Diastolic CHF: responding well to IV lasix w/ an additional 2.4L out yesterday. She denies dyspnea. When I walked into exam room, she was in supine position, completely flat. Normal breathing. No edema on exam. She has had a slight bump in SCr, up from 1.0>>1.20. Can likely transition to PO Lasix later today and recheck BMP in the AM.   2. Atrial Fibrillation: duration known. Was being treated for this prior to admission and was previously on Multaq but we discontinued this 11/6  due to systolic HF. She remains in afib w/ RVR in the 120s but asymptomatic. She remains on Eliquis 5 mg BID but we will check with SNF to confirm that she has not missed any doses in the last 4 weeks, as we may consider DCCV. For now, continue metoprolol for rate control. May also consider addition of amiodarone.   3. Cardiomyopathy: duration of HF unknown. Reported in [place]. Echo this admit shows midly reduced LVEF at 45-50% w/ diffuse hypokinesis.  blocker was added 11/6, metoprolol tartrate 25 mg BID. Tolerating ok. Plan was to try adding back her home Losartan as BP allows, but she has had a slight bump in SCr from 1.0>>1.2. Will continue to monitor renal function and will try resuming ARB if renal function stabilizes.   4. Valvular Heart Disease: Echo 11/5 showed Mild AS, mild AI, mild MS, moderate MR, moderate TR. She will need follow-up echoes in the future.  Conservative measures given age and overall medical problems.  5. HTN: normotensive this morning at 127/86. Continue to monitor.   6. Dementia: management per primary.    7. Renal Insuffiencey: slightly bump in SCr overnight from 1.03>>1.20 w/ diuresis. Home ARB remains on hold. Continue to monitor renal function closely.    For questions or updates, please contact [facility] Please consult [email or web address] for contact info under        Signed, Kirk Ruths, MD  10/27/2018, 10:43 AM   As above, patient seen and examined.   She is improving this morning.  She denies dyspnea or chest pain.  I will decrease Lasix to 40 mg IV daily.  Her heart rate remains mildly elevated.  Change IV Cardizem to 60 mg by mouth every 6 hours.  Continue metoprolol.  We can consider adding amiodarone later if heart rate difficult to control and blood pressure low.  If heart rate controlled and she remains asymptomatic best option may be rate control and anticoagulation.  If she is symptomatic despite rate control would need to consider adding amiodarone and proceeding with cardioversion.  Continue apixaban.  Kirk Ruths, MD

## 2018-10-27 NOTE — Progress Notes (Signed)
Occupational Therapy Treatment Patient Details Name: Nicole Barker MRN: [id number] DOB: 06-14-1930 Today's Date: 10/27/2018    History of present illness Pt was admitted with dyspnea.  PMH:  A fib, heart failure, HTN and dementia   OT comments  Pt assisted with toileting and grooming.  Agreeable to up in chair.  VSS  Follow Up Recommendations  SNF(vs ALF with assist for adls/mobility and HHOT)    Equipment Recommendations  3 in 1 bedside commode    Recommendations for Other Services      Precautions / Restrictions Precautions Precautions: Fall Restrictions Weight Bearing Restrictions: No       Mobility Bed Mobility         Supine to sit: Supervision        Transfers   Equipment used: Rolling walker (2 wheeled)   Sit to Stand: Min assist         General transfer comment: steadying assistance    Balance                                           ADL either performed or assessed with clinical judgement   ADL       Grooming: Set up;Sitting                   Toilet Transfer: Minimal assistance;Stand-pivot;BSC;RW   Toileting- Clothing Manipulation and Hygiene: Sit to/from stand;Maximal assistance(underwear and hygiene)         General ADL Comments: Pt had purewick leak while she was in bed; assisted to chair and performed grooming from sitting--will try to progress to standing      Vision       Perception     Praxis      Cognition Arousal/Alertness: Awake/alert Behavior During Therapy: WFL for tasks assessed/performed Overall Cognitive Status: History of cognitive impairments - at baseline                                 General Comments: h/o dementia; no family available. Aware that she is in the hospital, repeatedly asks same questions.          Exercises     Shoulder Instructions       General Comments      Pertinent Vitals/ Pain       Pain Assessment: No/denies pain  Home  Living                                          Prior Functioning/Environment              Frequency  Min 2X/week        Progress Toward Goals  OT Goals(current goals can now be found in the care plan section)  Progress towards OT goals: Progressing toward goals     Plan      Co-evaluation                 AM-PAC PT "6 Clicks" Daily Activity     Outcome Measure   Help from another person eating meals?: A Little Help from another person taking care of personal grooming?: A Little Help from another person toileting, which includes using toliet, bedpan, or urinal?: A Lot Help from  another person bathing (including washing, rinsing, drying)?: A Lot Help from another person to put on and taking off regular upper body clothing?: A Little Help from another person to put on and taking off regular lower body clothing?: A Lot 6 Click Score: 15    End of Session    OT Visit Diagnosis: Muscle weakness (generalized) (M62.81);Unsteadiness on feet (R26.81)   Activity Tolerance Patient tolerated treatment well   Patient Left in chair;with call bell/phone within reach;with chair alarm set   Nurse Communication          Time: 4920-1007 OT Time Calculation (min): 24 min  Charges: OT General Charges $OT Visit: 1 Visit OT Treatments $Self Care/Home Management : 23-37 mins  Lesle Chris, OTR/L Acute Rehabilitation Services [phone] [place] pager [phone] office 10/27/2018   Nancy Manuele 10/27/2018, 2:09 PM

## 2018-10-28 ENCOUNTER — Inpatient Hospital Stay (HOSPITAL_COMMUNITY): Payer: Medicare Other

## 2018-10-28 LAB — COMPREHENSIVE METABOLIC PANEL
ALT: 24 U/L (ref 0–44)
ANION GAP: 11 (ref 5–15)
AST: 34 U/L (ref 15–41)
Albumin: 3 g/dL — ABNORMAL LOW (ref 3.5–5.0)
Alkaline Phosphatase: 148 U/L — ABNORMAL HIGH (ref 38–126)
BUN: 25 mg/dL — AB (ref 8–23)
CHLORIDE: 96 mmol/L — AB (ref 98–111)
CO2: 24 mmol/L (ref 22–32)
Calcium: 8.7 mg/dL — ABNORMAL LOW (ref 8.9–10.3)
Creatinine, Ser: 1.05 mg/dL — ABNORMAL HIGH (ref 0.44–1.00)
GFR calc Af Amer: 54 mL/min — ABNORMAL LOW (ref >60)
GFR, EST NON AFRICAN AMERICAN: 47 mL/min — AB (ref >60)
Glucose, Bld: 110 mg/dL — ABNORMAL HIGH (ref 70–99)
POTASSIUM: 4.8 mmol/L (ref 3.5–5.1)
Sodium: 131 mmol/L — ABNORMAL LOW (ref 135–145)
TOTAL PROTEIN: 6 g/dL — AB (ref 6.5–8.1)
Total Bilirubin: 0.7 mg/dL (ref 0.3–1.2)

## 2018-10-28 LAB — CBC WITH DIFFERENTIAL/PLATELET
Abs Immature Granulocytes: 0.06 10*3/uL (ref 0.00–0.07)
BASOS ABS: 0.1 10*3/uL (ref 0.0–0.1)
Basophils Relative: 1 %
EOS ABS: 0.2 10*3/uL (ref 0.0–0.5)
Eosinophils Relative: 2 %
HEMATOCRIT: 37.6 % (ref 36.0–46.0)
Hemoglobin: 11.8 g/dL — ABNORMAL LOW (ref 12.0–15.0)
IMMATURE GRANULOCYTES: 1 %
LYMPHS ABS: 1 10*3/uL (ref 0.7–4.0)
LYMPHS PCT: 10 %
MCH: 29.1 pg (ref 26.0–34.0)
MCHC: 31.4 g/dL (ref 30.0–36.0)
MCV: 92.6 fL (ref 80.0–100.0)
Monocytes Absolute: 1.2 10*3/uL — ABNORMAL HIGH (ref 0.1–1.0)
Monocytes Relative: 12 %
NEUTROS ABS: 7.8 10*3/uL — AB (ref 1.7–7.7)
NEUTROS PCT: 74 %
NRBC: 0 % (ref 0.0–0.2)
Platelets: 327 10*3/uL (ref 150–400)
RBC: 4.06 MIL/uL (ref 3.87–5.11)
RDW: 16.8 % — AB (ref 11.5–15.5)
WBC: 10.4 10*3/uL (ref 4.0–10.5)

## 2018-10-28 LAB — MAGNESIUM: MAGNESIUM: 1.8 mg/dL (ref 1.7–2.4)

## 2018-10-28 LAB — PHOSPHORUS: PHOSPHORUS: 3.9 mg/dL (ref 2.5–4.6)

## 2018-10-28 MED ORDER — AMIODARONE HCL 200 MG PO TABS
400.0000 mg | ORAL_TABLET | Freq: Two times a day (BID) | ORAL | Status: DC
  Administered 2018-10-28 – 2018-11-01 (×9): 400 mg via ORAL
  Filled 2018-10-28 (×9): qty 2

## 2018-10-28 MED ORDER — FUROSEMIDE 10 MG/ML IJ SOLN
40.0000 mg | Freq: Two times a day (BID) | INTRAMUSCULAR | Status: DC
  Administered 2018-10-28 – 2018-11-01 (×7): 40 mg via INTRAVENOUS
  Filled 2018-10-28 (×7): qty 4

## 2018-10-28 NOTE — Progress Notes (Signed)
Physical Therapy Treatment Patient Details Name: Nicole Barker MRN: [id number] DOB: [birth year] Today's Date: 10/28/2018    History of Present Illness Pt was admitted with dyspnea.  PMH:  A fib, heart failure, HTN and dementia    PT Comments    Pt in bed on 2 lts.  Assisted OOB to [facility] to void then amb a great distance in hallway.  Remained on 2 lts lowest sat was 90%.  Tolerated distance distance well.   Follow Up Recommendations  Home health PT(at ALF)     Equipment Recommendations  None recommended by PT    Recommendations for Other Services       Precautions / Restrictions Precautions Precautions: Fall Precaution Comments: monitor vitals  Restrictions Weight Bearing Restrictions: No    Mobility  Bed Mobility Overal bed mobility: Needs Assistance Bed Mobility: Supine to Sit;Sit to Supine     Supine to sit: Supervision;Min guard Sit to supine: Supervision;Min guard   General bed mobility comments: assist with lines and covers only then assist to scoot to [facility]   Transfers Overall transfer level: Needs assistance Equipment used: Rolling walker (2 wheeled);None Transfers: Sit to/from [employer] to Stand: Min guard Stand pivot transfers: Min guard;Min assist       General transfer comment: assisted from bed to [facility] then from [facility] to standing 1ith 25% VC's on safety with turns/multiple lines/O2 tubing   Ambulation/Gait Ambulation/Gait assistance: Min guard;Min assist Gait Distance (Feet): 185 Feet Assistive device: Rolling walker (2 wheeled) Gait Pattern/deviations: Step-through pattern;Decreased stride length;Trunk flexed     General Gait Details: tolerated distance well woth 2 standing rest breaks.  Remained on 2 lts avg sats 90 - 92% and only 1/4 dyspnea.     Stairs             Wheelchair Mobility    Modified Rankin (Stroke Patients Only)       Balance                                             Cognition Arousal/Alertness: Awake/alert Behavior During Therapy: WFL for tasks assessed/performed Overall Cognitive Status: Within Functional Limits for tasks assessed                                 General Comments: very pleasant and motivated       Exercises      General Comments        Pertinent Vitals/Pain Pain Assessment: Faces Faces Pain Scale: Hurts a little bit Pain Location: L knee  Pain Descriptors / Indicators: Constant Pain Intervention(s): Monitored during session;Repositioned    Home Living                      Prior Function            PT Goals (current goals can now be found in the care plan section) Progress towards PT goals: Progressing toward goals    Frequency    Min 3X/week      PT Plan Current plan remains appropriate    Co-evaluation              AM-PAC PT "6 Clicks" Daily Activity  Outcome Measure  Difficulty turning over in bed (including adjusting bedclothes, sheets and blankets)?: Unable Difficulty moving from lying on  back to sitting on the side of the bed? : Unable Difficulty sitting down on and standing up from a chair with arms (e.g., wheelchair, bedside commode, etc,.)?: Unable Help needed moving to and from a bed to chair (including a wheelchair)?: A Little Help needed walking in hospital room?: A Little Help needed climbing 3-5 steps with a railing? : A Lot 6 Click Score: 11    End of Session Equipment Utilized During Treatment: Gait belt Activity Tolerance: Patient tolerated treatment well Patient left: in bed;with call bell/phone within reach;with bed alarm set   PT Visit Diagnosis: Unsteadiness on feet (R26.81)     Time: 3299-2426 PT Time Calculation (min) (ACUTE ONLY): 32 min  Charges:  $Gait Training: 8-22 mins $Therapeutic Activity: 8-22 mins                     Rica Koyanagi  PTA Acute  Rehabilitation Services Pager      [phone] Office      [phone]

## 2018-10-28 NOTE — Progress Notes (Addendum)
Progress Note  Patient Name: Nicole Barker Date of Encounter: 10/28/2018  Primary Cardiologist: Kirk Ruths, MD   Subjective   More confused this morning (dementia). Oriented to self but not place or time. Denies dyspnea. No pain.   Inpatient Medications    Scheduled Meds: . apixaban  5 mg Oral BID  . chlorhexidine  15 mL Mouth Rinse BID  . diltiazem  60 mg Oral Q6H  . furosemide  40 mg Intravenous Daily  . gabapentin  100 mg Oral BID  . mouth rinse  15 mL Mouth Rinse q12n4p  . metoprolol tartrate  25 mg Oral BID  . polyethylene glycol  17 g Oral BID  . potassium chloride  20 mEq Oral BID  . senna-docusate  1 tablet Oral BID  . sodium chloride flush  3 mL Intravenous Q12H   Continuous Infusions: . sodium chloride    .  ceFAZolin (ANCEF) IV Stopped (10/27/18 2302)   PRN Meds: sodium chloride, acetaminophen, hydrALAZINE, ondansetron (ZOFRAN) IV, sodium chloride flush, traMADol   Vital Signs    Vitals:   10/28/18 0400 10/28/18 0500 10/28/18 0600 10/28/18 0808  BP: 133/69 (!) 128/103 128/78   Pulse: 61 84 (!) 37   Resp: (!) 21 19 (!) 22   Temp: (!) 97.5 F (36.4 C)   98 F (36.7 C)  TempSrc: Oral   Oral  SpO2: 100% 98% 99%   Weight:      Height:        Intake/Output Summary (Last 24 hours) at 10/28/2018 0859 Last data filed at 10/28/2018 0800 Gross per 24 hour  Intake 172.22 ml  Output 701 ml  Net -528.78 ml   Filed Weights   10/26/18 0343 10/27/18 0500 10/28/18 0349  Weight: 72.2 kg 72 kg 73.2 kg    Telemetry    afib 110s - Personally Reviewed  ECG    Not performed today - Personally Reviewed  Physical Exam   GEN: pleasantly demented elderly WF in No acute distress.   Neck: No JVD Cardiac: irregularly irregular, tachy rate Respiratory: LLL crackles at the base GI: Soft, nontender, non-distended  MS: No edema; No deformity. Neuro:  dementia at baseline Psych: Normal affect   Labs    Chemistry Recent Labs  Lab 10/25/18 0943  10/26/18 0518 10/27/18 0302 10/28/18 0337  NA 132* 134* 133* 131*  K 4.5 4.0 4.3 4.8  CL 98 99 97* 96*  CO2 _0 GLUCOSE 109* 93 119* 110*  BUN 16 19 25* 25*  CREATININE 1.01* 1.03* 1.20* 1.05*  CALCIUM 9.0 8.9 8.8* 8.7*  PROT 6.6  --  6.2* 6.0*  ALBUMIN 3.6  --  3.1* 3.0*  AST 32  --  28 34  ALT 30  --  22 24  ALKPHOS 162*  --  139* 148*  BILITOT 1.0  --  0.6 0.7  GFRNONAA 49* 48* 40* 47*  GFRAA 57* 55* 46* 54*  ANIONGAP _1 Hematology Recent Labs  Lab 10/26/18 0518 10/27/18 0302 10/28/18 0337  WBC 12.6* 13.5* 10.4  RBC 4.22 4.01 4.06  HGB 12.1 11.6* 11.8*  HCT 38.0 37.4 37.6  MCV 90.0 93.3 92.6  MCH 28.7 28.9 29.1  MCHC 31.8 31.0 31.4  RDW 16.7* 17.0* 16.8*  PLT 342 341 327    Cardiac Enzymes Recent Labs  Lab 10/25/18 1729 10/25/18 2307 10/26/18 0518  TROPONINI <0.03 <0.03 <0.03    Recent Labs  Lab 10/25/18  0949  TROPIPOC 0.00     BNP Recent Labs  Lab 10/25/18 0943  BNP 1,010.4*     DDimer No results for input(s): DDIMER in the last 168 hours.   Radiology    Dg Chest Port 1 View  Result Date: 10/28/2018 CLINICAL DATA:  Shortness of breath EXAM: PORTABLE CHEST 1 VIEW COMPARISON:  10/25/2018 FINDINGS: Cardiomegaly. Aortic atherosclerosis. Worsening of edema pattern. Developing effusions with lower lobe atelectasis. Upper lungs are otherwise well aerated. IMPRESSION: Congestive heart failure. Cardiomegaly and aortic atherosclerosis. Worsening of edema pattern. Accumulating effusions with lower lobe atelectasis. Electronically Signed   By: Nelson Chimes M.D.   On: 10/28/2018 06:26    Cardiac Studies   2D Echo 10/25/18 Study Conclusions  - Left ventricle: The cavity size was normal. Wall thickness was increased in a pattern of mild LVH. Systolic function was mildly reduced. The estimated ejection fraction was in the range of 45% to 50%. Diffuse hypokinesis. Doppler parameters are consistent with high ventricular  filling pressure. - Aortic valve: Valve mobility was restricted. There was mild stenosis. There was mild regurgitation. - Mitral valve: Calcified annulus. The findings are consistent with mild stenosis. There was moderate regurgitation. - Left atrium: The atrium was mildly dilated. - Tricuspid valve: There was moderate regurgitation. - Pulmonary arteries: Systolic pressure was moderately to severely increased. PA peak pressure: 65 mm Hg (S). - Pericardium, extracardiac: There was a left pleural effusion.  Impressions:  - Mild global reduction in LV systolic function; mild LVH; calcified aortic valve with mild AS (mean gradient 15 mmHg) and mild AI; MAC with mild MS (mean gradient 5 mmHg); moderate MR; mild LAE; moderate TR; moderate to severe pulmonary hypertension.  Patient Profile     Nicole [name] a 84 y.o.female, recently moved from [place] 1 yr agoto be closer to family now at [facility] for dementia,with a reportedhx of heart failure, atrial fibrillation, chronic anticoagulation, HTN, HLD, dementia and asthmawho is being seen today for the evaluation ofacute combined systolic and diastolic heart failure and atrial fibrillationw/ RVR,at the request of Dr. Lonny Prude, Internal Medicine.  Assessment & Plan   1. Acute on Chronic Combined Systolic and Diastolic CHF: repeat CXR today showed Congestive heart failure. Cardiomegaly and aortic atherosclerosis. Worsening of edema pattern. Accumulating effusions with lower lobe atelectasis. She appears to be breathing ok, but will need to continue IV lasix. Monitor UOP. Strict I/Os. Monitor SCr. If poor urinary response, may need to increase lasix dose again (reduced from BID to once daily yesterday due to renal function, now improved).    2. Atrial Fibrillation: duration known. Was being treated for this prior to admission and was previously on Multaq but we discontinued this 16/1 due to systolic HF. She  remains in afib w/ RVR in the low 100s-110s. Metoprolol was added and Cardizem dose was increased but her HR remains elevated and she remains in CHF. She may do better from a CHF standpoint if able to cardiovert back to NSR. Continue Eliquis. Will discuss with MD TEE guided DCCV on Monday.  May also consider addition of amiodarone.   3. Cardiomyopathy: duration of HFunknown. Reported in [place]. Echo this admit shows midly reduced LVEF at 45-50% w/ diffuse hypokinesis. ? blocker was added 11/6, metoprolol tartrate 25 mg BID. Tolerating ok. Plan was to try adding back her home Losartan as BP allows and renal function allows. Will continue to monitor renal function and will try resuming ARB if renal function stabilizes.   4.  Valvular Heart Disease: Echo 11/5 showed Mild AS, mild AI, mild MS, moderate MR, moderate TR. She will need follow-up echoes in the future. Conservative measures given age and overall medical problems.  5. HTN: normotensive this morning at 128/78. Continue to monitor.   6. Dementia: management per primary.    7. Renal Insuffiencey: slightly bump in SCr yesterday from 1.03>>1.20 w/ diuresis. Her lasix dose was reduced from BID to once daily and SCr improved, now at 1.05. Home ARB remains on hold. Continue to monitor renal function closely.    For questions or updates, please contact [place] Please consult [email or web address] for contact info under        Signed, Lyda Jester, PA-C  10/28/2018, 8:59 AM   As above, patient seen and examined.  More dyspneic this morning.  No chest pain.  Remains somewhat confused.  I will increase Lasix to 40 mg IV twice daily and follow renal function.  She remains in atrial fibrillation.  This is likely contributing to congestive heart failure.  Continue Cardizem and metoprolol for rate control.  Add amiodarone 400 mg twice daily.  If CHF does not improve or heart rate difficult to control would need to pursue TEE guided cardioversion  early next week.  Continue apixaban.  Kirk Ruths, MD

## 2018-10-28 NOTE — Progress Notes (Addendum)
PROGRESS NOTE    Nicole Barker  YPP:[id number] DOB: Apr 05, 1930 DOA: 10/25/2018 PCP: Joya San, MD   Brief Narrative:  HPI per Dr. Cordelia Poche on 10/25/18 HPI: Nicole Barker is a 84 y.o. female with medical history significant of heart failure, atrial fibrillation, dementia, hypertension, hyperlipidemia. Patient unable to provide history. She is unsure of why she is here. She does state that she is having shortness of breath of unknown time frame. She also has some frequency.  ED Course: Vitals: Afebrile, tachycardia, tachypnea, hypertensive, on room air Labs: sodium of 132, creatinine of 1.01, alkaline phosphatase of 162, BNP of 1010, WBC of 12.7 Imaging: Chest x-ray significant for cardiomegaly, edema, bilateral effusion, atelectasis Medications/Course: Albuterol, lasix, lopressor, given  **Patient feels improved from yesterday and is not as dyspneic.  Denies any nausea or vomiting.  No other concerns or complaints at this time.  Cardiology reduced her diuresis yesterday currently given her slight bump in creatinine but increased it back to IV 40 mg BID due to worsening dyspena.  Assessment & Plan:   Principal Problem:   Acute heart failure (HCC) Active Problems:   Atrial fibrillation with RVR (HCC)   Dementia without behavioral disturbance (HCC)   Essential hypertension   Hyperlipidemia   AKI (acute kidney injury) ([place])  Acute Respiratory Failure with Hypoxia in the setting of Acute Decompensated Combined Systolic and Diastolic CHF with an EF of 45-50%, improving -Continue supplemental oxygen via nasal cannula and wean O2 as tolerated -Continuous pulse oximetry and maintain O2 saturations greater than 92% -Initial chest x-ray showed cardiomegaly with mild edema and bilateral effusions suggesting congestive heart failure and bibasilar airspace disease that reflected atelectasis greater on the left than the right -Repeat CXR this AM showed "Congestive heart  failure. Cardiomegaly and aortic atherosclerosis. Worsening of edema pattern. Accumulating effusions with lower lobe Atelectasis." -Not wearing O2 when I examined her but she was being placed back on O2 moving from the bed to the chair -Continue with diuresis; Dose increased back to IV 40 mg BID -We will need home Ambulatory screen prior to discharge  Acute on Chronic combined systolic and diastolic CHF with an EF of 40 to 45% -On lasix, losartan and potassium as an outpatient. -BNP on Admission was 1,010.4  -Troponin negative x3 No chest pain.  -Weight on admission of 145 lbs -C/w Lasix 40 mg IV BID again with potassium supplementation -Strict in and out/daily weights -Watch kidney function while being diuresed -Transthoracic Echocardiogram as below -Oxygen as needed to keep O2 >94% -Continue to Hold losartan until creatinine improves or appears stable -Patient is -3.365 L since admission; however question of weights are accurate and his weight has gone up to 161 -Diuresis dose was adjusted by cardiology given a slight bump in creatinine yesterday however because of her dyspnea they have increased it back to IV 40 mg twice daily from IV 40 daily -Continue to monitor volume status daily -Cardiology consulted for further evaluation recommendations and patient started on metoprolol 25 mg p.o. twice daily we will continue -Will fluid Restrict Patient to 1500 mL  Atrial fibrillation with RVR -Improved with metoprolol in ED. Possibly contributed to acute heart failure -Stopped Dronedarone as contraindicated in patient with CHF but will continue with apixaban for anticoagulation -Continue with telemetry -Cardiology evaluated and added metoprolol 25 mill grams p.o. twice daily -Patient remained on Cardizem drip for rate control and cardiology change IV Cardizem to p.o. Cardizem 60 mg by mouth every 6 hours.  Cardiology still  considering adding amiodarone if the heart rate is difficult to  control blood pressure low -Per cardiology if she is symptomatic despite being rate controlled will need to consider adding amiodarone and proceeding with cardioversion as well as continue with apixaban for anticoagulation -We will need to obtain outside records to see if she has been in sinus recently and she has may be reasonable to proceed with cardioversion however this will need to be verified  -CHA2DS2-VASc is elevated at 5 -Cardiology feels as if A Fib with RVR is contributing to Heart Failure so they have added Amiodarone 400 mg po BID -Per cardiology if the CHF does not improve her heart rate is difficult control we will need to pursue a TEE guided cardioversion early next week  Acute kidney injury but suspect Chronic Kidney Disease, improved -No baseline. Likely in setting of heart failure. CrCl of 29.1 -C/w IV Lasix; Dose was reduced to IV 40 mg Daily but will increase back to BID dosing given worsened dyspnea today -Adjust medications for decreased function -BUN/Cr went from 12/1.01 -> 19/1.03 -> 25/1.20 -> 25/1.05 -Continue to Monitor and Trend Renal Function -Repeat CMP in AM   Urinary frequency with E Coli UTI -Urinalysis showed clear urine small hemoglobin, small leukocytes, positive nitrites, many bacteria, 0-5 WBCs -Urine culture showed greater than 100,000 colonies gram-negative rods and it was E Coli that was Pan-sensitive -Changed empiric IV Ceftriaxone to Cefazolin to [facility] and will continue IV Cefazolin   Dementia -Does not appear to be on medications. Currently resides at [facility] but states she lives with her daughter. -Social work consulted   Essential Hypertension -Uncontrolled in setting of fluid overload. -Hold Losartan for now and cardiology has added Metoprolol 25 mg p.o. twice daily -Lasix as above  -Cardiology Starting patient on po Cardizem   Hyponatremia -In setting of volume overload -Continue with diuresis and sodium is now  131 -Continue monitor and repeat CMP in a.m.  Valvular Heart Disease -Seen on ECHO -Cardiology following and recommending conservative measures   Leukocytosis -Patient is Afebrile but WBC had gone from 12.6 -> 13.5; Now improved to 10.5  -? Hemoconcentration in the setting of Lasix Use -Not on Any steroids -Urine Cx + for E Coli -No Blood Cx's done but will obtain if spikes a temperature -CXR not indicative of PNA -Repeat CXR this AM showed Congestive heart failure. Cardiomegaly and aortic atherosclerosis. Worsening of edema pattern. Accumulating effusions with lower lobe atelectasis. -Repeat CBC in AM   Obesity -Estimated body mass index is 32.59 kg/m as calculated from the following:   Height as of this encounter: _0  (1.499 m).   Weight as of this encounter: 73.2 kg. -Weight Loss Counseling given  DVT prophylaxis: Anticoagulated with Apixaban  Code Status: Deferred Family Communication: No family present at bedside Disposition Plan: [facility] at ALF   Consultants:  Cardiology   Procedures:  ECHOCARDIOGRAM 10/25/18 ------------------------------------------------------------------- Study Conclusions  - Left ventricle: The cavity size was normal. Wall thickness was   increased in a pattern of mild LVH. Systolic function was mildly   reduced. The estimated ejection fraction was in the range of 45%   to 50%. Diffuse hypokinesis. Doppler parameters are consistent   with high ventricular filling pressure. - Aortic valve: Valve mobility was restricted. There was mild   stenosis. There was mild regurgitation. - Mitral valve: Calcified annulus. The findings are consistent with   mild stenosis. There was moderate regurgitation. - Left atrium: The atrium  was mildly dilated. - Tricuspid valve: There was moderate regurgitation. - Pulmonary arteries: Systolic pressure was moderately to severely   increased. PA peak pressure: 65 mm Hg (S). - Pericardium,  extracardiac: There was a left pleural effusion.  Impressions:  - Mild global reduction in LV systolic function; mild LVH;   calcified aortic valve with mild AS (mean gradient 15 mmHg) and   mild AI; MAC with mild MS (mean gradient 5 mmHg); moderate MR;   mild LAE; moderate TR; moderate to severe pulmonary hypertension   Antimicrobials:  Anti-infectives (From admission, onward)   Start     Dose/Rate Route Frequency Ordered Stop   10/27/18 2200  ceFAZolin (ANCEF) IVPB 1 g/50 mL premix     1 g 100 mL/hr over 30 Minutes Intravenous Every 12 hours 10/27/18 0907     10/25/18 2200  cefTRIAXone (ROCEPHIN) 1 g in sodium chloride 0.9 % 100 mL IVPB  Status:  Discontinued     1 g 200 mL/hr over 30 Minutes Intravenous Every 24 hours 10/25/18 2144 10/27/18 3734     Subjective: Seen and examined at bedside with little bit more confused today and states she is still short of breath.  Wanted to get from the chair to the bed.  No chest pain, lightheadedness or dizziness.  Legs are slightly more swollen.  No other concerns or complaints at this time. Objective: Vitals:   10/28/18 1227 10/28/18 1300 10/28/18 1400 10/28/18 1739  BP:  102/70 128/85 (!) 139/95  Pulse:  (!) 58    Resp:  (!) 25 (!) 26   Temp: 98.5 F (36.9 C)     TempSrc: Oral     SpO2:  99%    Weight:      Height:        Intake/Output Summary (Last 24 hours) at 10/28/2018 1800 Last data filed at 10/28/2018 1500 Gross per 24 hour  Intake 52.22 ml  Output 1201 ml  Net -1148.78 ml   Filed Weights   10/26/18 0343 10/27/18 0500 10/28/18 0349  Weight: 72.2 kg 72 kg 73.2 kg   Examination: Physical Exam:  Constitutional: Well-nourished, well-developed obese pleasantly demented Caucasian female is currently no acute distress appears calm and comfortable but is a little bit more confused today Eyes: Sclera is anicteric.  Lids external normal ENMT: External ears and nose appear normal.  Mucous members are moist Neck: Appears  supple with no appreciable JVD Respiratory: Diminished to auscultation bilaterally no appreciable wheezing rales or rhonchi but patient does have some crackles.  No accessory muscles to breathe and was not wearing supplemental oxygen via nasal cannula this morning Cardiovascular: Irregularly irregular and slightly tachycardic.  Has a murmur as noted.  Has trace to 1+ lower extremity edema Abdomen: Soft, nontender, slightly distended secondary to body habitus.  Bowel sounds present all 4 quadrants GU: Deferred Musculoskeletal: No contractures cyanosis noted.  No joint deformities noted Skin: Skin is warm and dry no appreciable rashes or lesions limited skin evaluation Neurologic: Cranial nerves II through XII grossly intact no appreciable focal deficits Psychiatric: Confused this morning but had a normal mood and affect.  Patient is awake and alert.  Data Reviewed: I have personally reviewed following labs and imaging studies  CBC: Recent Labs  Lab 10/25/18 0943 10/26/18 0518 10/27/18 0302 10/28/18 0337  WBC 12.7* 12.6* 13.5* 10.4  NEUTROABS 9.9*  --  10.1* 7.8*  HGB 12.2 12.1 11.6* 11.8*  HCT 38.5 38.0 37.4 37.6  MCV 90.6 90.0 93.3 92.6  PLT 350 342 341 681   Basic Metabolic Panel: Recent Labs  Lab 10/25/18 0943 10/26/18 0518 10/27/18 0302 10/28/18 0337  NA 132* 134* 133* 131*  K 4.5 4.0 4.3 4.8  CL 98 99 97* 96*  CO2 _0 GLUCOSE 109* 93 119* 110*  BUN 16 19 25* 25*  CREATININE 1.01* 1.03* 1.20* 1.05*  CALCIUM 9.0 8.9 8.8* 8.7*  MG  --   --  1.7 1.8  PHOS  --   --  4.9* 3.9   GFR: Estimated Creatinine Clearance: 33.5 mL/min (A) (by C-G formula based on SCr of 1.05 mg/dL (H)). Liver Function Tests: Recent Labs  Lab 10/25/18 0943 10/27/18 0302 10/28/18 0337  AST 32 28 34  ALT _1 ALKPHOS 162* 139* 148*  BILITOT 1.0 0.6 0.7  PROT 6.6 6.2* 6.0*  ALBUMIN 3.6 3.1* 3.0*   No results for input(s): LIPASE, AMYLASE in the last 168 hours. No results for  input(s): AMMONIA in the last 168 hours. Coagulation Profile: No results for input(s): INR, PROTIME in the last 168 hours. Cardiac Enzymes: Recent Labs  Lab 10/25/18 1729 10/25/18 2307 10/26/18 0518  TROPONINI <0.03 <0.03 <0.03   BNP (last 3 results) No results for input(s): PROBNP in the last 8760 hours. HbA1C: No results for input(s): HGBA1C in the last 72 hours. CBG: No results for input(s): GLUCAP in the last 168 hours. Lipid Profile: No results for input(s): CHOL, HDL, LDLCALC, TRIG, CHOLHDL, LDLDIRECT in the last 72 hours. Thyroid Function Tests: No results for input(s): TSH, T4TOTAL, FREET4, T3FREE, THYROIDAB in the last 72 hours. Anemia Panel: No results for input(s): VITAMINB12, FOLATE, FERRITIN, TIBC, IRON, RETICCTPCT in the last 72 hours. Sepsis Labs: No results for input(s): PROCALCITON, LATICACIDVEN in the last 168 hours.  Recent Results (from the past 240 hour(s))  Culture, Urine     Status: Abnormal   Collection Time: 10/25/18 12:08 PM  Result Value Ref Range Status   Specimen Description   Final    URINE, RANDOM Performed at [facility] [address], [place], [place] [address]    Special Requests   Final    NONE Performed at [facility], [place] [address] [place], [place] [address]    Culture >=100,000 COLONIES/mL ESCHERICHIA COLI (A)  Final   Report Status 10/27/2018 FINAL  Final   Organism ID, Bacteria ESCHERICHIA COLI (A)  Final      Susceptibility   Escherichia coli - MIC*    AMPICILLIN 8 SENSITIVE Sensitive     CEFAZOLIN <=4 SENSITIVE Sensitive     CEFTRIAXONE <=1 SENSITIVE Sensitive     CIPROFLOXACIN <=0.25 SENSITIVE Sensitive     GENTAMICIN <=1 SENSITIVE Sensitive     IMIPENEM <=0.25 SENSITIVE Sensitive     NITROFURANTOIN <=16 SENSITIVE Sensitive     TRIMETH/SULFA <=20 SENSITIVE Sensitive     AMPICILLIN/SULBACTAM <=2 SENSITIVE Sensitive     PIP/TAZO <=4 SENSITIVE Sensitive     Extended ESBL NEGATIVE  Sensitive     * >=100,000 COLONIES/mL ESCHERICHIA COLI  MRSA PCR Screening     Status: None   Collection Time: 10/25/18 12:58 PM  Result Value Ref Range Status   MRSA by PCR NEGATIVE NEGATIVE Final    Comment:        The GeneXpert MRSA Assay (FDA approved for NASAL specimens only), is one component of a comprehensive MRSA colonization surveillance program. It is not intended to diagnose MRSA infection nor to guide or  monitor treatment for MRSA infections. Performed at [facility], [place] [address], [place], [place] [address]     Radiology Studies: Dg Chest Port 1 View  Result Date: 10/28/2018 CLINICAL DATA:  Shortness of breath EXAM: PORTABLE CHEST 1 VIEW COMPARISON:  10/25/2018 FINDINGS: Cardiomegaly. Aortic atherosclerosis. Worsening of edema pattern. Developing effusions with lower lobe atelectasis. Upper lungs are otherwise well aerated. IMPRESSION: Congestive heart failure. Cardiomegaly and aortic atherosclerosis. Worsening of edema pattern. Accumulating effusions with lower lobe atelectasis. Electronically Signed   By: Nelson Chimes M.D.   On: 10/28/2018 06:26   Scheduled Meds: . amiodarone  400 mg Oral BID  . apixaban  5 mg Oral BID  . chlorhexidine  15 mL Mouth Rinse BID  . diltiazem  60 mg Oral Q6H  . furosemide  40 mg Intravenous BID  . gabapentin  100 mg Oral BID  . mouth rinse  15 mL Mouth Rinse q12n4p  . metoprolol tartrate  25 mg Oral BID  . polyethylene glycol  17 g Oral BID  . potassium chloride  20 mEq Oral BID  . senna-docusate  1 tablet Oral BID  . sodium chloride flush  3 mL Intravenous Q12H   Continuous Infusions: . sodium chloride    .  ceFAZolin (ANCEF) IV Stopped (10/28/18 1020)    LOS: 2 days   Kerney Elbe, DO [facility] PAGER is on AMION  If 7PM-7AM, please contact night-coverage [email or web address] Password [email or web address] 10/28/2018, 6:00 PM

## 2018-10-29 ENCOUNTER — Inpatient Hospital Stay (HOSPITAL_COMMUNITY): Payer: Medicare Other

## 2018-10-29 DIAGNOSIS — D72829 Elevated white blood cell count, unspecified: Secondary | ICD-10-CM

## 2018-10-29 DIAGNOSIS — I4819 Other persistent atrial fibrillation: Secondary | ICD-10-CM

## 2018-10-29 DIAGNOSIS — I10 Essential (primary) hypertension: Secondary | ICD-10-CM

## 2018-10-29 DIAGNOSIS — I5043 Acute on chronic combined systolic (congestive) and diastolic (congestive) heart failure: Secondary | ICD-10-CM

## 2018-10-29 LAB — CBC WITH DIFFERENTIAL/PLATELET
ABS IMMATURE GRANULOCYTES: 0.04 10*3/uL (ref 0.00–0.07)
BASOS ABS: 0.1 10*3/uL (ref 0.0–0.1)
Basophils Relative: 1 %
EOS PCT: 3 %
Eosinophils Absolute: 0.4 10*3/uL (ref 0.0–0.5)
HEMATOCRIT: 38 % (ref 36.0–46.0)
HEMOGLOBIN: 11.9 g/dL — AB (ref 12.0–15.0)
Immature Granulocytes: 0 %
LYMPHS ABS: 1.3 10*3/uL (ref 0.7–4.0)
LYMPHS PCT: 11 %
MCH: 29 pg (ref 26.0–34.0)
MCHC: 31.3 g/dL (ref 30.0–36.0)
MCV: 92.7 fL (ref 80.0–100.0)
MONO ABS: 1.5 10*3/uL — AB (ref 0.1–1.0)
Monocytes Relative: 12 %
NEUTROS ABS: 9 10*3/uL — AB (ref 1.7–7.7)
Neutrophils Relative %: 73 %
Platelets: 365 10*3/uL (ref 150–400)
RBC: 4.1 MIL/uL (ref 3.87–5.11)
RDW: 16.6 % — ABNORMAL HIGH (ref 11.5–15.5)
WBC: 12.3 10*3/uL — AB (ref 4.0–10.5)
nRBC: 0 % (ref 0.0–0.2)

## 2018-10-29 LAB — COMPREHENSIVE METABOLIC PANEL
ALK PHOS: 169 U/L — AB (ref 38–126)
ALT: 28 U/L (ref 0–44)
AST: 36 U/L (ref 15–41)
Albumin: 3.2 g/dL — ABNORMAL LOW (ref 3.5–5.0)
Anion gap: 11 (ref 5–15)
BILIRUBIN TOTAL: 0.9 mg/dL (ref 0.3–1.2)
BUN: 24 mg/dL — AB (ref 8–23)
CALCIUM: 9 mg/dL (ref 8.9–10.3)
CHLORIDE: 93 mmol/L — AB (ref 98–111)
CO2: 27 mmol/L (ref 22–32)
CREATININE: 1.1 mg/dL — AB (ref 0.44–1.00)
GFR, EST AFRICAN AMERICAN: 51 mL/min — AB (ref >60)
GFR, EST NON AFRICAN AMERICAN: 44 mL/min — AB (ref >60)
Glucose, Bld: 108 mg/dL — ABNORMAL HIGH (ref 70–99)
Potassium: 4.5 mmol/L (ref 3.5–5.1)
Sodium: 131 mmol/L — ABNORMAL LOW (ref 135–145)
TOTAL PROTEIN: 6.2 g/dL — AB (ref 6.5–8.1)

## 2018-10-29 LAB — MAGNESIUM: MAGNESIUM: 1.8 mg/dL (ref 1.7–2.4)

## 2018-10-29 LAB — PHOSPHORUS: Phosphorus: 3.9 mg/dL (ref 2.5–4.6)

## 2018-10-29 MED ORDER — FLUTICASONE PROPIONATE 50 MCG/ACT NA SUSP
1.0000 | Freq: Every day | NASAL | Status: DC
  Administered 2018-10-29 – 2018-11-01 (×4): 1 via NASAL
  Filled 2018-10-29: qty 16

## 2018-10-29 NOTE — Progress Notes (Signed)
Report called to [facility] on [place], will transfer pt on telemetry in wheelchair.

## 2018-10-29 NOTE — Progress Notes (Signed)
Progress Note  Patient Name: Nicole Barker Date of Encounter: 10/29/2018  Primary Cardiologist: Dr. Kirk Ruths  Subjective   Patient reports shortness of breath, no chest pain or palpitations.  Inpatient Medications    Scheduled Meds: . amiodarone  400 mg Oral BID  . apixaban  5 mg Oral BID  . chlorhexidine  15 mL Mouth Rinse BID  . diltiazem  60 mg Oral Q6H  . furosemide  40 mg Intravenous BID  . gabapentin  100 mg Oral BID  . mouth rinse  15 mL Mouth Rinse q12n4p  . metoprolol tartrate  25 mg Oral BID  . polyethylene glycol  17 g Oral BID  . potassium chloride  20 mEq Oral BID  . senna-docusate  1 tablet Oral BID  . sodium chloride flush  3 mL Intravenous Q12H   Continuous Infusions: . sodium chloride    .  ceFAZolin (ANCEF) IV Stopped (10/28/18 2321)   PRN Meds: sodium chloride, acetaminophen, hydrALAZINE, ondansetron (ZOFRAN) IV, sodium chloride flush, traMADol   Vital Signs    Vitals:   10/28/18 2330 10/29/18 0400 10/29/18 0440 10/29/18 0746  BP:    (!) 151/87  Pulse:    62  Resp:    17  Temp: 98.3 F (36.8 C) 97.6 F (36.4 C)    TempSrc: Oral Oral    SpO2:    100%  Weight:   70.8 kg   Height:        Intake/Output Summary (Last 24 hours) at 10/29/2018 0816 Last data filed at 10/29/2018 0700 Gross per 24 hour  Intake 51.3 ml  Output 1700 ml  Net -1648.7 ml   Filed Weights   10/27/18 0500 10/28/18 0349 10/29/18 0440  Weight: 72 kg 73.2 kg 70.8 kg    Telemetry    Atrial fibrillation, occasional PVCs versus aberrantly conducted complexes.  Personally reviewed.  Physical Exam   GEN:  Elderly woman, no distress. Neck: No JVD. Cardiac:  Irregularly irregular, no gallop.  Respiratory:  Few crackles at the bases, no wheezing. GI: Soft, nontender, bowel sounds present. MS: No edema; No deformity. Neuro:  Nonfocal. Psych: Alert and oriented x 1. Normal affect.  Labs    Chemistry Recent Labs  Lab 10/27/18 0302 10/28/18 0337  10/29/18 0322  NA 133* 131* 131*  K 4.3 4.8 4.5  CL 97* 96* 93*  CO2 _0 GLUCOSE 119* 110* 108*  BUN 25* 25* 24*  CREATININE 1.20* 1.05* 1.10*  CALCIUM 8.8* 8.7* 9.0  PROT 6.2* 6.0* 6.2*  ALBUMIN 3.1* 3.0* 3.2*  AST 28 34 36  ALT _1 ALKPHOS 139* 148* 169*  BILITOT 0.6 0.7 0.9  GFRNONAA 40* 47* 44*  GFRAA 46* 54* 51*  ANIONGAP _2 Hematology Recent Labs  Lab 10/27/18 0302 10/28/18 0337 10/29/18 0322  WBC 13.5* 10.4 12.3*  RBC 4.01 4.06 4.10  HGB 11.6* 11.8* 11.9*  HCT 37.4 37.6 38.0  MCV 93.3 92.6 92.7  MCH 28.9 29.1 29.0  MCHC 31.0 31.4 31.3  RDW 17.0* 16.8* 16.6*  PLT 341 327 365    Cardiac Enzymes Recent Labs  Lab 10/25/18 1729 10/25/18 2307 10/26/18 0518  TROPONINI <0.03 <0.03 <0.03    Recent Labs  Lab 10/25/18 0949  TROPIPOC 0.00     BNP Recent Labs  Lab 10/25/18 0943  BNP 1,010.4*     Radiology    Dg Chest Port 1 View  Result Date: 10/29/2018 CLINICAL DATA:  Shortness of breath EXAM: PORTABLE CHEST 1 VIEW COMPARISON:  10/28/2018 FINDINGS: Enlarged cardiac silhouette as seen yesterday. Aortic atherosclerosis. There is radiographic improvement with less interstitial and alveolar edema and better aeration of the lower lungs. No worsening or new finding. IMPRESSION: Radiographic improvement with less edema. Electronically Signed   By: Nelson Chimes M.D.   On: 10/29/2018 06:49   Dg Chest Port 1 View  Result Date: 10/28/2018 CLINICAL DATA:  Shortness of breath EXAM: PORTABLE CHEST 1 VIEW COMPARISON:  10/25/2018 FINDINGS: Cardiomegaly. Aortic atherosclerosis. Worsening of edema pattern. Developing effusions with lower lobe atelectasis. Upper lungs are otherwise well aerated. IMPRESSION: Congestive heart failure. Cardiomegaly and aortic atherosclerosis. Worsening of edema pattern. Accumulating effusions with lower lobe atelectasis. Electronically Signed   By: Nelson Chimes M.D.   On: 10/28/2018 06:26    Cardiac Studies    Echocardiogram 10/25/2018: Study Conclusions  - Left ventricle: The cavity size was normal. Wall thickness was   increased in a pattern of mild LVH. Systolic function was mildly   reduced. The estimated ejection fraction was in the range of 45%   to 50%. Diffuse hypokinesis. Doppler parameters are consistent   with high ventricular filling pressure. - Aortic valve: Valve mobility was restricted. There was mild   stenosis. There was mild regurgitation. - Mitral valve: Calcified annulus. The findings are consistent with   mild stenosis. There was moderate regurgitation. - Left atrium: The atrium was mildly dilated. - Tricuspid valve: There was moderate regurgitation. - Pulmonary arteries: Systolic pressure was moderately to severely   increased. PA peak pressure: 65 mm Hg (S). - Pericardium, extracardiac: There was a left pleural effusion.  Impressions:  - Mild global reduction in LV systolic function; mild LVH;   calcified aortic valve with mild AS (mean gradient 15 mmHg) and   mild AI; MAC with mild MS (mean gradient 5 mmHg); moderate MR;   mild LAE; moderate TR; moderate to severe pulmonary hypertension.  Patient Profile     84 y.o. female now at [facility] for dementia,with a reportedhx of heart failure, atrial fibrillation, chronic anticoagulation, HTN, HLD, dementia and asthmawho is being followed for the evaluation ofacute combined systolic and diastolic heart failure and atrial fibrillationw/ RVR.  Assessment & Plan    1.  Persistent atrial fibrillation, overall duration is uncertain.  She is on Eliquis for stroke prophylaxis.  Heart rate control has been somewhat difficult, although significantly improved today with recent medication adjustments.  She is on oral amiodarone load along with Cardizem 60 mg every 6 hours and Lopressor 25 mg twice daily.  2.  Acute on chronic combined heart failure, LVEF only mildly decreased at 45 to 50%.  Chest x-ray shows improving  edema and she is diuresing on IV Lasix with stable renal function.  Approximately 1600 cc out more than in last 24 hours.  3.  Essential hypertension, systolic blood pressure 939Q to 150s.  4.  Dementia.  Continue Eliquis, amiodarone, Cardizem, and Lopressor at current doses.  If heart rate control remains stable through today can convert to Cardizem CD 240 mg daily tomorrow.  Continue IV Lasix with follow-up urine output and BMET in the a.m.  Not entirely clear that TEE cardioversion will need to be pursued, but we will keep this in mind depending on her clinical progress.  Signed, Rozann Lesches, MD  10/29/2018, 8:16 AM

## 2018-10-29 NOTE — Progress Notes (Signed)
PROGRESS NOTE    Nicole Barker  LKG:[id number] DOB: 07-23-1930 DOA: 10/25/2018 PCP: Joya San, MD   Brief Narrative:  HPI per Dr. Cordelia Poche on 10/25/18 HPI: Nicole Barker is a 84 y.o. female with medical history significant of heart failure, atrial fibrillation, dementia, hypertension, hyperlipidemia. Patient unable to provide history. She is unsure of why she is here. She does state that she is having shortness of breath of unknown time frame. She also has some frequency.  ED Course: Vitals: Afebrile, tachycardia, tachypnea, hypertensive, on room air Labs: sodium of 132, creatinine of 1.01, alkaline phosphatase of 162, BNP of 1010, WBC of 12.7 Imaging: Chest x-ray significant for cardiomegaly, edema, bilateral effusion, atelectasis Medications/Course: Albuterol, lasix, lopressor, given  **Patient feels improved from yesterday and is not as dyspneic.  Denies any nausea or vomiting.  No other concerns or complaints at this time.  Cardiology reduced her diuresis 10/27/18 currently given her slight bump in creatinine but increased it back to IV 40 mg BID  Yesterday due to worsening dyspena and will continue again.  Assessment & Plan:   Principal Problem:   Acute heart failure (HCC) Active Problems:   Atrial fibrillation with RVR (HCC)   Dementia without behavioral disturbance (HCC)   Essential hypertension   Hyperlipidemia   AKI (acute kidney injury) ([place])  Acute Respiratory Failure with Hypoxia in the setting of Acute Decompensated Combined Systolic and Diastolic CHF with an EF of 45-50%, improving -Continue supplemental oxygen via nasal cannula and wean O2 as tolerated; per nurse this morning and she did not actually need oxygen but was wearing it for comfort -Continuous pulse oximetry and maintain O2 saturations greater than 92% -Initial chest x-ray showed cardiomegaly with mild edema and bilateral effusions suggesting congestive heart failure and bibasilar  airspace disease that reflected atelectasis greater on the left than the right -Repeat CXR this AM showed "Enlarged cardiac silhouette as seen yesterday. Aortic atherosclerosis. There is radiographic improvement with less interstitial and alveolar edema and better aeration of the lower lungs. No worsening or new finding." -Continue with diuresis at IV 40 mg BID and continue to follow Urine output and monitor Renal Fxn  -We will need home Ambulatory screen prior to discharge  Acute on Chronic combined systolic and diastolic CHF with an EF of 40 to 45% -On lasix, losartan and potassium as an outpatient. -BNP on Admission was 1,010.4  -Troponin negative x3 No chest pain.  -Weight on admission of 145 lbs -C/w Lasix 40 mg IV BID again with potassium supplementation -Strict in and out/daily weights -Watch kidney function while being diuresed -Transthoracic Echocardiogram as below -Oxygen as needed to keep O2 >94% -Continue to Hold losartan until creatinine improves or appears stable -Patient is -4.322 L since admission; however question of weights are accurate and his weight has gone up to 161 and now coming down to 156 -Because of her dyspnea Cardiology increased it back to IV 40 mg twice daily from IV 40 daily and will continue again -Continue to monitor volume status daily -Cardiology consulted for further evaluation recommendations and patient started on metoprolol 25 mg p.o. twice daily we will continue -Will fluid Restrict Patient to 1500 mL  Atrial fibrillation with RVR -Improved with metoprolol in ED. Possibly contributed to acute heart failure -Stopped Dronedarone as contraindicated in patient with CHF but will continue with apixaban for anticoagulation -Continue with telemetry when moved out of SDU -Cardiology evaluated and added metoprolol 25 mg p.o. twice daily -Patient remained on Cardizem drip  for rate control and cardiology change IV Cardizem to p.o. Cardizem 60 mg by mouth  every 6 hours and considering switching to 240 mg po CD Daily -Per cardiology if she is symptomatic despite being rate controlled will need to consider adding amiodarone and proceeding with cardioversion as well as continue with apixaban for anticoagulation -We will need to obtain outside records to see if she has been in sinus recently and she has may be reasonable to proceed with cardioversion however this will need to be verified  -CHA2DS2-VASc is elevated at 5 -Cardiology feels as if A Fib with RVR is contributing to Heart Failure so they have added Amiodarone 400 mg po BID -Per cardiology if the CHF does not improve her heart rate is difficult control we will need to pursue a TEE guided cardioversion early next week but not entirely clear this will need to be pursed   Acute kidney injury but suspect Chronic Kidney Disease, improved -No baseline. Likely in setting of heart failure. CrCl of 29.1 -C/w IV Lasix; Dose was reduced to IV 40 mg Daily but will increase back to BID dosing given worsened dyspnea today -Adjust medications for decreased function -BUN/Cr went from 12/1.01 -> 19/1.03 -> 25/1.20 -> 25/1.05 -> 24/1.10 -Continue to Monitor and Trend Renal Function -Repeat CMP in AM   Urinary frequency with E Coli UTI -Urinalysis showed clear urine small hemoglobin, small leukocytes, positive nitrites, many bacteria, 0-5 WBCs -Urine culture showed greater than 100,000 colonies gram-negative rods and it was E Coli that was Pan-sensitive -Changed empiric IV Ceftriaxone to Cefazolin to Narrow Treatment and will continue IV Cefazolin  -This is Day 5/7 Abx   Dementia -Does not appear to be on medications. Currently resides at [facility] but states she lives with her daughter. -Has periods of confusion -Delirium Precautions -Social work consulted   Essential Hypertension -Uncontrolled in setting of fluid overload. -Hold Losartan for now and cardiology has added Metoprolol 25 mg  p.o. twice daily -Lasix as above  -Cardiology Starting patient on po Cardizem   Hyponatremia -In setting of volume overload -Continue with diuresis and sodium is now 131 -Continue monitor and repeat CMP in a.m.  Valvular Heart Disease -Seen on ECHO -Cardiology following and recommending conservative measures   Leukocytosis -Patient is Afebrile but WBC had gone from 12.6 -> 13.5 -> 10.5 -> 12.3; Continues to Flutate -? Hemoconcentration in the setting of Lasix Use -Not on Any steroids -Urine Cx + for E Coli -No Blood Cx's done but will obtain if spikes a temperature -CXR not indicative of PNA -Continue to Monitor for S/Sx of Infection -Repeat CBC in AM   Obesity -Estimated body mass index is 31.53 kg/m as calculated from the following:   Height as of this encounter: _0  (1.499 m).   Weight as of this encounter: 70.8 kg. -Weight Loss Counseling given  DVT prophylaxis: Anticoagulated with Apixaban  Code Status: Deferred Family Communication: No family present at bedside Disposition Plan: [facility] at ALF when medically stable to D/C; Currently stable to transfer out of the SDU  Consultants:  Cardiology   Procedures:  ECHOCARDIOGRAM 10/25/18 ------------------------------------------------------------------- Study Conclusions  - Left ventricle: The cavity size was normal. Wall thickness was   increased in a pattern of mild LVH. Systolic function was mildly   reduced. The estimated ejection fraction was in the range of 45%   to 50%. Diffuse hypokinesis. Doppler parameters are consistent   with high ventricular filling pressure. - Aortic valve:  Valve mobility was restricted. There was mild   stenosis. There was mild regurgitation. - Mitral valve: Calcified annulus. The findings are consistent with   mild stenosis. There was moderate regurgitation. - Left atrium: The atrium was mildly dilated. - Tricuspid valve: There was moderate regurgitation. - Pulmonary  arteries: Systolic pressure was moderately to severely   increased. PA peak pressure: 65 mm Hg (S). - Pericardium, extracardiac: There was a left pleural effusion.  Impressions:  - Mild global reduction in LV systolic function; mild LVH;   calcified aortic valve with mild AS (mean gradient 15 mmHg) and   mild AI; MAC with mild MS (mean gradient 5 mmHg); moderate MR;   mild LAE; moderate TR; moderate to severe pulmonary hypertension   Antimicrobials:  Anti-infectives (From admission, onward)   Start     Dose/Rate Route Frequency Ordered Stop   10/27/18 2200  ceFAZolin (ANCEF) IVPB 1 g/50 mL premix     1 g 100 mL/hr over 30 Minutes Intravenous Every 12 hours 10/27/18 0907     10/25/18 2200  cefTRIAXone (ROCEPHIN) 1 g in sodium chloride 0.9 % 100 mL IVPB  Status:  Discontinued     1 g 200 mL/hr over 30 Minutes Intravenous Every 24 hours 10/25/18 2144 10/27/18 3007     Subjective: Seen and examined at bedside been weaned off of oxygen per nursing but had to be placed back on for comfort.  Patient subjectively felt short of breath.  Denies any lightheadedness or dizziness.  States that she is cold. Thinks leg swelling is slightly better.  No other concerns or complaints at this time and is still slightly confused.  Objective: Vitals:   10/29/18 0800 10/29/18 0857 10/29/18 1051 10/29/18 1153  BP: 127/73  (!) 141/69   Pulse: (!) 48  (!) 57   Resp: (!) 24     Temp:  (!) 97.5 F (36.4 C) 97.9 F (36.6 C)   TempSrc:  Axillary Oral   SpO2: 100%  92% 98%  Weight:      Height:        Intake/Output Summary (Last 24 hours) at 10/29/2018 1506 Last data filed at 10/29/2018 0950 Gross per 24 hour  Intake 288.3 ml  Output 1200 ml  Net -911.7 ml   Filed Weights   10/27/18 0500 10/28/18 0349 10/29/18 0440  Weight: 72 kg 73.2 kg 70.8 kg   Examination: Physical Exam:  Constitutional: Well-nourished, well-developed obese pleasantly demented Caucasian female currently no acute distress  appears calm but complaining of dyspnea in bed. Eyes: Sclera is anicteric.  Lids and conjunctive are normal ENMT: External ears and nose appear normal.  Slightly hard of hearing Neck: Appears supple with no JVD Respiratory: Diminished to auscultation bilaterally no appreciable wheezing, rales, rhonchi.  Patient does have some crackles appreciated but they have improved from yesterday.  No accessory muscle usage however she is wearing supplemental oxygen via nasal cannula this morning and it is likely for comfort at night because she truly needed Cardiovascular: Irregularly irregular and slightly tachycardic.  Has a murmur is noted.  Has trace lower extremity edema Abdomen: Soft, nontender, slightly distended secondary body habitus.  Bowel sounds present all 4 quadrants GU: Deferred Musculoskeletal: No contractures or cyanosis noted.  No joint deformities noted Skin: Skin is warm and dry no appreciable rashes or lesions limited skin evaluation Neurologic: Renal nerves II through XII grossly intact with no appreciable focal deficit Psychiatric: Pleasantly demented but has a normal mood and affect.  She is  awake and alert.  Data Reviewed: I have personally reviewed following labs and imaging studies  CBC: Recent Labs  Lab 10/25/18 0943 10/26/18 0518 10/27/18 0302 10/28/18 0337 10/29/18 0322  WBC 12.7* 12.6* 13.5* 10.4 12.3*  NEUTROABS 9.9*  --  10.1* 7.8* 9.0*  HGB 12.2 12.1 11.6* 11.8* 11.9*  HCT 38.5 38.0 37.4 37.6 38.0  MCV 90.6 90.0 93.3 92.6 92.7  PLT 350 342 341 327 759   Basic Metabolic Panel: Recent Labs  Lab 10/25/18 0943 10/26/18 0518 10/27/18 0302 10/28/18 0337 10/29/18 0322  NA 132* 134* 133* 131* 131*  K 4.5 4.0 4.3 4.8 4.5  CL 98 99 97* 96* 93*  CO2 _0 GLUCOSE 109* 93 119* 110* 108*  BUN 16 19 25* 25* 24*  CREATININE 1.01* 1.03* 1.20* 1.05* 1.10*  CALCIUM 9.0 8.9 8.8* 8.7* 9.0  MG  --   --  1.7 1.8 1.8  PHOS  --   --  4.9* 3.9 3.9    GFR: Estimated Creatinine Clearance: 31.4 mL/min (A) (by C-G formula based on SCr of 1.1 mg/dL (H)). Liver Function Tests: Recent Labs  Lab 10/25/18 0943 10/27/18 0302 10/28/18 0337 10/29/18 0322  AST 32 28 34 36  ALT _1 ALKPHOS 162* 139* 148* 169*  BILITOT 1.0 0.6 0.7 0.9  PROT 6.6 6.2* 6.0* 6.2*  ALBUMIN 3.6 3.1* 3.0* 3.2*   No results for input(s): LIPASE, AMYLASE in the last 168 hours. No results for input(s): AMMONIA in the last 168 hours. Coagulation Profile: No results for input(s): INR, PROTIME in the last 168 hours. Cardiac Enzymes: Recent Labs  Lab 10/25/18 1729 10/25/18 2307 10/26/18 0518  TROPONINI <0.03 <0.03 <0.03   BNP (last 3 results) No results for input(s): PROBNP in the last 8760 hours. HbA1C: No results for input(s): HGBA1C in the last 72 hours. CBG: No results for input(s): GLUCAP in the last 168 hours. Lipid Profile: No results for input(s): CHOL, HDL, LDLCALC, TRIG, CHOLHDL, LDLDIRECT in the last 72 hours. Thyroid Function Tests: No results for input(s): TSH, T4TOTAL, FREET4, T3FREE, THYROIDAB in the last 72 hours. Anemia Panel: No results for input(s): VITAMINB12, FOLATE, FERRITIN, TIBC, IRON, RETICCTPCT in the last 72 hours. Sepsis Labs: No results for input(s): PROCALCITON, LATICACIDVEN in the last 168 hours.  Recent Results (from the past 240 hour(s))  Culture, Urine     Status: Abnormal   Collection Time: 10/25/18 12:08 PM  Result Value Ref Range Status   Specimen Description   Final    URINE, RANDOM Performed at [facility] [address], [place], [place] [address]    Special Requests   Final    NONE Performed at [facility], [place] [address] [place], [place] [address]    Culture >=100,000 COLONIES/mL ESCHERICHIA COLI (A)  Final   Report Status 10/27/2018 FINAL  Final   Organism ID, Bacteria ESCHERICHIA COLI (A)  Final      Susceptibility   Escherichia coli - MIC*     AMPICILLIN 8 SENSITIVE Sensitive     CEFAZOLIN <=4 SENSITIVE Sensitive     CEFTRIAXONE <=1 SENSITIVE Sensitive     CIPROFLOXACIN <=0.25 SENSITIVE Sensitive     GENTAMICIN <=1 SENSITIVE Sensitive     IMIPENEM <=0.25 SENSITIVE Sensitive     NITROFURANTOIN <=16 SENSITIVE Sensitive     TRIMETH/SULFA <=20 SENSITIVE Sensitive     AMPICILLIN/SULBACTAM <=2 SENSITIVE Sensitive     PIP/TAZO <=4 SENSITIVE Sensitive  Extended ESBL NEGATIVE Sensitive     * >=100,000 COLONIES/mL ESCHERICHIA COLI  MRSA PCR Screening     Status: None   Collection Time: 10/25/18 12:58 PM  Result Value Ref Range Status   MRSA by PCR NEGATIVE NEGATIVE Final    Comment:        The GeneXpert MRSA Assay (FDA approved for NASAL specimens only), is one component of a comprehensive MRSA colonization surveillance program. It is not intended to diagnose MRSA infection nor to guide or monitor treatment for MRSA infections. Performed at [facility], [place] [address] [place], [place] [address]     Radiology Studies: Dg Chest Port 1 View  Result Date: 10/29/2018 CLINICAL DATA:  Shortness of breath EXAM: PORTABLE CHEST 1 VIEW COMPARISON:  10/28/2018 FINDINGS: Enlarged cardiac silhouette as seen yesterday. Aortic atherosclerosis. There is radiographic improvement with less interstitial and alveolar edema and better aeration of the lower lungs. No worsening or new finding. IMPRESSION: Radiographic improvement with less edema. Electronically Signed   By: Nelson Chimes M.D.   On: 10/29/2018 06:49   Dg Chest Port 1 View  Result Date: 10/28/2018 CLINICAL DATA:  Shortness of breath EXAM: PORTABLE CHEST 1 VIEW COMPARISON:  10/25/2018 FINDINGS: Cardiomegaly. Aortic atherosclerosis. Worsening of edema pattern. Developing effusions with lower lobe atelectasis. Upper lungs are otherwise well aerated. IMPRESSION: Congestive heart failure. Cardiomegaly and aortic atherosclerosis. Worsening of edema pattern. Accumulating  effusions with lower lobe atelectasis. Electronically Signed   By: Nelson Chimes M.D.   On: 10/28/2018 06:26   Scheduled Meds: . amiodarone  400 mg Oral BID  . apixaban  5 mg Oral BID  . chlorhexidine  15 mL Mouth Rinse BID  . diltiazem  60 mg Oral Q6H  . fluticasone  1 spray Each Nare Daily  . furosemide  40 mg Intravenous BID  . gabapentin  100 mg Oral BID  . mouth rinse  15 mL Mouth Rinse q12n4p  . metoprolol tartrate  25 mg Oral BID  . polyethylene glycol  17 g Oral BID  . potassium chloride  20 mEq Oral BID  . senna-docusate  1 tablet Oral BID  . sodium chloride flush  3 mL Intravenous Q12H   Continuous Infusions: . sodium chloride    .  ceFAZolin (ANCEF) IV Stopped (10/29/18 1020)    LOS: 3 days   Kerney Elbe, DO [facility] PAGER is on [place]  If 7PM-7AM, please contact night-coverage [email or web address] Password [email or web address] 10/29/2018, 3:06 PM

## 2018-10-30 ENCOUNTER — Inpatient Hospital Stay (HOSPITAL_COMMUNITY): Payer: Medicare Other

## 2018-10-30 LAB — CBC WITH DIFFERENTIAL/PLATELET
ABS IMMATURE GRANULOCYTES: 0.04 10*3/uL (ref 0.00–0.07)
Basophils Absolute: 0.1 10*3/uL (ref 0.0–0.1)
Basophils Relative: 1 %
Eosinophils Absolute: 0.4 10*3/uL (ref 0.0–0.5)
Eosinophils Relative: 3 %
HCT: 39.6 % (ref 36.0–46.0)
HEMOGLOBIN: 12.4 g/dL (ref 12.0–15.0)
Immature Granulocytes: 0 %
LYMPHS PCT: 11 %
Lymphs Abs: 1.3 10*3/uL (ref 0.7–4.0)
MCH: 29 pg (ref 26.0–34.0)
MCHC: 31.3 g/dL (ref 30.0–36.0)
MCV: 92.7 fL (ref 80.0–100.0)
Monocytes Absolute: 1 10*3/uL (ref 0.1–1.0)
Monocytes Relative: 9 %
NEUTROS ABS: 8.9 10*3/uL — AB (ref 1.7–7.7)
NRBC: 0 % (ref 0.0–0.2)
Neutrophils Relative %: 76 %
Platelets: 358 10*3/uL (ref 150–400)
RBC: 4.27 MIL/uL (ref 3.87–5.11)
RDW: 16.3 % — ABNORMAL HIGH (ref 11.5–15.5)
WBC: 11.8 10*3/uL — AB (ref 4.0–10.5)

## 2018-10-30 LAB — COMPREHENSIVE METABOLIC PANEL
ALK PHOS: 158 U/L — AB (ref 38–126)
ALT: 21 U/L (ref 0–44)
AST: 32 U/L (ref 15–41)
Albumin: 3.2 g/dL — ABNORMAL LOW (ref 3.5–5.0)
Anion gap: 10 (ref 5–15)
BUN: 26 mg/dL — AB (ref 8–23)
CALCIUM: 9 mg/dL (ref 8.9–10.3)
CO2: 30 mmol/L (ref 22–32)
CREATININE: 1.03 mg/dL — AB (ref 0.44–1.00)
Chloride: 92 mmol/L — ABNORMAL LOW (ref 98–111)
GFR calc Af Amer: 55 mL/min — ABNORMAL LOW (ref >60)
GFR calc non Af Amer: 48 mL/min — ABNORMAL LOW (ref >60)
GLUCOSE: 105 mg/dL — AB (ref 70–99)
Potassium: 4.5 mmol/L (ref 3.5–5.1)
SODIUM: 132 mmol/L — AB (ref 135–145)
Total Bilirubin: 0.7 mg/dL (ref 0.3–1.2)
Total Protein: 6.3 g/dL — ABNORMAL LOW (ref 6.5–8.1)

## 2018-10-30 LAB — MAGNESIUM: Magnesium: 1.8 mg/dL (ref 1.7–2.4)

## 2018-10-30 LAB — PHOSPHORUS: Phosphorus: 3.4 mg/dL (ref 2.5–4.6)

## 2018-10-30 MED ORDER — DILTIAZEM HCL ER COATED BEADS 240 MG PO CP24
240.0000 mg | ORAL_CAPSULE | Freq: Every day | ORAL | Status: DC
  Administered 2018-10-30 – 2018-11-01 (×3): 240 mg via ORAL
  Filled 2018-10-30 (×3): qty 1

## 2018-10-30 NOTE — Progress Notes (Signed)
Progress Note  Patient Name: Nicole Barker Date of Encounter: 10/30/2018  Primary Cardiologist: Dr. Kirk Ruths  Subjective   No active palpitations or shortness of breath at rest.  Inpatient Medications    Scheduled Meds: . amiodarone  400 mg Oral BID  . apixaban  5 mg Oral BID  . chlorhexidine  15 mL Mouth Rinse BID  . diltiazem  60 mg Oral Q6H  . fluticasone  1 spray Each Nare Daily  . furosemide  40 mg Intravenous BID  . gabapentin  100 mg Oral BID  . mouth rinse  15 mL Mouth Rinse q12n4p  . metoprolol tartrate  25 mg Oral BID  . polyethylene glycol  17 g Oral BID  . potassium chloride  20 mEq Oral BID  . senna-docusate  1 tablet Oral BID  . sodium chloride flush  3 mL Intravenous Q12H   Continuous Infusions: . sodium chloride    .  ceFAZolin (ANCEF) IV 1 g (10/29/18 2254)   PRN Meds: sodium chloride, acetaminophen, hydrALAZINE, ondansetron (ZOFRAN) IV, sodium chloride flush, traMADol   Vital Signs    Vitals:   10/30/18 0444 10/30/18 0450 10/30/18 0548 10/30/18 0551  BP:  (!) 144/60 124/83   Pulse:  (!) 58 (!) 59 80  Resp:  20    Temp:  97.6 F (36.4 C)    TempSrc:  Oral    SpO2:  93%    Weight: 69.9 kg     Height:        Intake/Output Summary (Last 24 hours) at 10/30/2018 0923 Last data filed at 10/30/2018 0432 Gross per 24 hour  Intake 290 ml  Output 900 ml  Net -610 ml   Filed Weights   10/28/18 0349 10/29/18 0440 10/30/18 0444  Weight: 73.2 kg 70.8 kg 69.9 kg    Telemetry    Rate controlled atrial fibrillation.  Personally reviewed.  Physical Exam   GEN:  Elderly woman, no acute distress.   Neck: No JVD. Cardiac:  Irregularly irregular, no gallop.  Respiratory:  No wheezing. GI: Soft, nontender, bowel sounds present. MS: No edema; No deformity. Neuro:  Nonfocal. Psych: Alert and oriented x 1. Normal affect.  Labs    Chemistry Recent Labs  Lab 10/28/18 0337 10/29/18 0322 10/30/18 0447  NA 131* 131* 132*  K 4.8  4.5 4.5  CL 96* 93* 92*  CO2 _0 GLUCOSE 110* 108* 105*  BUN 25* 24* 26*  CREATININE 1.05* 1.10* 1.03*  CALCIUM 8.7* 9.0 9.0  PROT 6.0* 6.2* 6.3*  ALBUMIN 3.0* 3.2* 3.2*  AST 34 36 32  ALT _1 ALKPHOS 148* 169* 158*  BILITOT 0.7 0.9 0.7  GFRNONAA 47* 44* 48*  GFRAA 54* 51* 55*  ANIONGAP _2 Hematology Recent Labs  Lab 10/28/18 0337 10/29/18 0322 10/30/18 0447  WBC 10.4 12.3* 11.8*  RBC 4.06 4.10 4.27  HGB 11.8* 11.9* 12.4  HCT 37.6 38.0 39.6  MCV 92.6 92.7 92.7  MCH 29.1 29.0 29.0  MCHC 31.4 31.3 31.3  RDW 16.8* 16.6* 16.3*  PLT 327 365 358    Cardiac Enzymes Recent Labs  Lab 10/25/18 1729 10/25/18 2307 10/26/18 0518  TROPONINI <0.03 <0.03 <0.03    Recent Labs  Lab 10/25/18 0949  TROPIPOC 0.00     BNP Recent Labs  Lab 10/25/18 0943  BNP 1,010.4*     Radiology    Dg Chest Port 1 View  Result Date: 10/30/2018  CLINICAL DATA:  Nicole Barker is a 84 y.o. female with medical history significant of heart failure, atrial fibrillation, dementia, hypertension, hyperlipidemia. Patient unable to provide history. She is unsure of why she is here. She does state that she is having shortness of breath of unknown time frame EXAM: PORTABLE CHEST 1 VIEW COMPARISON:  10/29/2018 FINDINGS: Cardiac silhouette is mildly enlarged. No mediastinal or hilar masses. Bilateral lung base opacities consistent with a combination of small effusions and probable atelectasis. Remainder of the lungs is clear. No pneumothorax. IMPRESSION: 1. Pulmonary edema essentially resolved. 2. Residual small pleural effusions with basilar atelectasis. Electronically Signed   By: Lajean Manes M.D.   On: 10/30/2018 07:15   Dg Chest Port 1 View  Result Date: 10/29/2018 CLINICAL DATA:  Shortness of breath EXAM: PORTABLE CHEST 1 VIEW COMPARISON:  10/28/2018 FINDINGS: Enlarged cardiac silhouette as seen yesterday. Aortic atherosclerosis. There is radiographic improvement with  less interstitial and alveolar edema and better aeration of the lower lungs. No worsening or new finding. IMPRESSION: Radiographic improvement with less edema. Electronically Signed   By: Nelson Chimes M.D.   On: 10/29/2018 06:49    Cardiac Studies   Echocardiogram 10/25/2018: Study Conclusions  - Left ventricle: The cavity size was normal. Wall thickness was increased in a pattern of mild LVH. Systolic function was mildly reduced. The estimated ejection fraction was in the range of 45% to 50%. Diffuse hypokinesis. Doppler parameters are consistent with high ventricular filling pressure. - Aortic valve: Valve mobility was restricted. There was mild stenosis. There was mild regurgitation. - Mitral valve: Calcified annulus. The findings are consistent with mild stenosis. There was moderate regurgitation. - Left atrium: The atrium was mildly dilated. - Tricuspid valve: There was moderate regurgitation. - Pulmonary arteries: Systolic pressure was moderately to severely increased. PA peak pressure: 65 mm Hg (S). - Pericardium, extracardiac: There was a left pleural effusion.  Impressions:  - Mild global reduction in LV systolic function; mild LVH; calcified aortic valve with mild AS (mean gradient 15 mmHg) and mild AI; MAC with mild MS (mean gradient 5 mmHg); moderate MR; mild LAE; moderate TR; moderate to severe pulmonary hypertension.  Patient Profile     84 y.o. female now at [facility] for dementia,with a reportedhx of heart failure, atrial fibrillation, chronic anticoagulation, HTN, HLD, dementia and asthmawho is being followed for the evaluation ofacute combined systolic and diastolic heart failure and atrial fibrillationw/ RVR.  Assessment & Plan    1.  Persistent atrial fibrillation of uncertain duration.  She continues on Eliquis for stroke prophylaxis.  Heart rate control has been significantly improved on amiodarone load along with Cardizem and  Lopressor.  2.  Acute on chronic combined heart failure with LVEF 45 to 50% range. Continues to diurese on IV Lasix and renal function has been stable.  3.  Essential hypertension, systolic blood pressure in the 120s to 130s.  4.  Dementia.  Continue current amiodarone load as well as Lopressor, changing from short acting Cardizem to Cardizem CD 240 mg daily.  Continue IV diuresis with follow-up BMET in a.m.  With better heart rate control and clinical improvement, no clear indication to pursue TEE cardioversion at this time.  Signed, Rozann Lesches, MD  10/30/2018, 9:23 AM

## 2018-10-30 NOTE — Progress Notes (Signed)
PROGRESS NOTE    Tanise Russman  XVQ:[id number] DOB: Aug 05, 1930 DOA: 10/25/2018 PCP: Joya San, MD   Brief Narrative:  HPI per Dr. Cordelia Poche on 10/25/18 HPI: Colandra Ohanian is a 84 y.o. female with medical history significant of heart failure, atrial fibrillation, dementia, hypertension, hyperlipidemia. Patient unable to provide history. She is unsure of why she is here. She does state that she is having shortness of breath of unknown time frame. She also has some frequency.  ED Course: Vitals: Afebrile, tachycardia, tachypnea, hypertensive, on room air Labs: sodium of 132, creatinine of 1.01, alkaline phosphatase of 162, BNP of 1010, WBC of 12.7 Imaging: Chest x-ray significant for cardiomegaly, edema, bilateral effusion, atelectasis Medications/Course: Albuterol, lasix, lopressor, given  **Patient feels improved from yesterday and is not as dyspneic.  Denies any nausea or vomiting.  No other concerns or complaints at this time.  Cardiology reduced her diuresis 10/27/18 currently given her slight bump in creatinine but increased it back to IV 40 mg BID to to worsened dyspnea. Cardiology changed Cardizem to CD (10/30/18) and recommending continuing IV Diuresis.   Assessment & Plan:   Principal Problem:   Acute heart failure (HCC) Active Problems:   Atrial fibrillation with RVR (HCC)   Dementia without behavioral disturbance (HCC)   Essential hypertension   Hyperlipidemia   AKI (acute kidney injury) ([place])  Acute Respiratory Failure with Hypoxia in the setting of Acute Decompensated Combined Systolic and Diastolic CHF with an EF of 45-50%, improving -Continue supplemental oxygen via nasal cannula and wean O2 as tolerated; per nurse yesterday morning and she did not actually need oxygen but was wearing it for comfort; Was seen wearing O2 this AM  -Continuous pulse oximetry and maintain O2 saturations greater than 92% -Initial chest x-ray showed cardiomegaly with  mild edema and bilateral effusions suggesting congestive heart failure and bibasilar airspace disease that reflected atelectasis greater on the left than the right -Repeat CXR this AM showed "Cardiac silhouette is mildly enlarged. No mediastinal or hilar masses. Bilateral lung base opacities consistent with a combination of small effusions and probable atelectasis. Remainder of the lungs is clear." -Continue with diuresis at IV 40 mg BID and continue to follow Urine output and monitor Renal Fxn  -We will need home Ambulatory screen prior to discharge  Acute on Chronic combined systolic and diastolic CHF with an EF of 40 to 45% -On lasix, losartan and potassium as an outpatient. -BNP on Admission was 1,010.4  -Troponin negative x3 No chest pain.  -Weight on admission of 145 lbs -C/w Lasix 40 mg IV BID again with potassium supplementation -Strict in and out/daily weights -Watch kidney function while being diuresed -Transthoracic Echocardiogram as below -Oxygen as needed to keep O2 >94% -Continue to Hold losartan until creatinine improves or appears stable -Patient is -4.851 L since admission; however question of weights are accurate and his weight has gone up to 161 and now coming down to 154 -Because of her dyspnea Cardiology increased it back to IV 40 mg twice daily from IV 40 daily and will continue again today  -Continue to monitor volume status daily -Cardiology consulted for further evaluation recommendations and patient started on metoprolol 25 mg p.o. twice daily we will continue -Fluid Restrict Patient to 1500 mL  Atrial fibrillation with RVR -Improved with metoprolol in ED. Possibly contributed to acute heart failure -Stopped Dronedarone as contraindicated in patient with CHF but will continue with apixaban for anticoagulation -Continue with telemetry when moved out of SDU -Cardiology  evaluated and added metoprolol 25 mg p.o. twice daily -Off of Cardizem gtt and Cardiology change  Cardizem 60 mg q6h -> Cardizem CD 240 mg po Daily -Per cardiology if she is symptomatic despite being rate controlled will need to consider adding amiodarone and proceeding with cardioversion as well as continue with apixaban for anticoagulation -We will need to obtain outside records to see if she has been in sinus recently and she has may be reasonable to proceed with cardioversion however this will need to be verified  -CHA2DS2-VASc is elevated at 5 -Cardiology feels as if A Fib with RVR is contributing to Heart Failure so they have added Amiodarone 400 mg po BID -Per cardiology if the CHF does not improve her heart rate is difficult control we will need to pursue a TEE guided cardioversion early next week but not entirely clear this will need to be pursed as her HR is better controlled and she is clinically improved  Acute kidney injury but suspect Chronic Kidney Disease, improved -No baseline. Likely in setting of heart failure. CrCl of 29.1 -C/w IV Lasix; Dose was reduced to IV 40 mg Daily but will increase back to BID dosing given worsened dyspnea yesterday; Will continue today  -Adjust medications for decreased function -BUN/Cr went from 12/1.01 -> 19/1.03 -> 25/1.20 -> 25/1.05 -> 24/1.10 -> 26/1.03 -Continue to Monitor and Trend Renal Function -Repeat CMP in AM   Urinary frequency with E Coli UTI -Urinalysis showed clear urine small hemoglobin, small leukocytes, positive nitrites, many bacteria, 0-5 WBCs -Urine culture showed greater than 100,000 colonies gram-negative rods and it was E Coli that was Pan-sensitive -Changed empiric IV Ceftriaxone to Cefazolin to Narrow Treatment and will continue IV Cefazolin  -This is Day 6/7 Abx   Dementia -Does not appear to be on medications. Currently resides at [facility] but states she lives with her daughter. -Has periods of confusion -Delirium Precautions -Social work consulted   Essential Hypertension -Uncontrolled in setting  of fluid overload. -Hold Losartan for now and cardiology has added Metoprolol 25 mg p.o. twice daily -Lasix as above  -Cardiology Starting patient on po Cardizem   Hyponatremia -In setting of volume overload -Continue with diuresis and sodium is now 132 -Continue monitor and repeat CMP in a.m.  Valvular Heart Disease -Seen on ECHO -Cardiology following and recommending conservative measures   Leukocytosis -Patient is Afebrile but WBC had gone from 12.6 -> 13.5 -> 10.5 -> 12.3 -> 11.8 ; Continues to Flutate -? Hemoconcentration in the setting of Lasix Use -Not on Any steroids -Urine Cx + for E Coli -No Blood Cx's done but will obtain if spikes a temperature -CXR not indicative of PNA -Continue to Monitor for S/Sx of Infection -Repeat CBC in AM   Obesity -Estimated body mass index is 31.12 kg/m as calculated from the following:   Height as of this encounter: _0  (1.499 m).   Weight as of this encounter: 69.9 kg. -Weight Loss Counseling given  DVT prophylaxis: Anticoagulated with Apixaban  Code Status: Deferred Family Communication: No family present at bedside Disposition Plan: [facility] at ALF when medically stable to D/C; Currently stable to transfer out of the SDU  Consultants:  Cardiology   Procedures:  ECHOCARDIOGRAM 10/25/18 ------------------------------------------------------------------- Study Conclusions  - Left ventricle: The cavity size was normal. Wall thickness was   increased in a pattern of mild LVH. Systolic function was mildly   reduced. The estimated ejection fraction was in the range of 45%  to 50%. Diffuse hypokinesis. Doppler parameters are consistent   with high ventricular filling pressure. - Aortic valve: Valve mobility was restricted. There was mild   stenosis. There was mild regurgitation. - Mitral valve: Calcified annulus. The findings are consistent with   mild stenosis. There was moderate regurgitation. - Left atrium: The  atrium was mildly dilated. - Tricuspid valve: There was moderate regurgitation. - Pulmonary arteries: Systolic pressure was moderately to severely   increased. PA peak pressure: 65 mm Hg (S). - Pericardium, extracardiac: There was a left pleural effusion.  Impressions:  - Mild global reduction in LV systolic function; mild LVH;   calcified aortic valve with mild AS (mean gradient 15 mmHg) and   mild AI; MAC with mild MS (mean gradient 5 mmHg); moderate MR;   mild LAE; moderate TR; moderate to severe pulmonary hypertension   Antimicrobials:  Anti-infectives (From admission, onward)   Start     Dose/Rate Route Frequency Ordered Stop   10/27/18 2200  ceFAZolin (ANCEF) IVPB 1 g/50 mL premix     1 g 100 mL/hr over 30 Minutes Intravenous Every 12 hours 10/27/18 0907     10/25/18 2200  cefTRIAXone (ROCEPHIN) 1 g in sodium chloride 0.9 % 100 mL IVPB  Status:  Discontinued     1 g 200 mL/hr over 30 Minutes Intravenous Every 24 hours 10/25/18 2144 10/27/18 7026     Subjective: Seen and examined bedside and was doing well.  States that she is no longer having shortness of breath and thinks her leg swelling is improved however she was wearing supplemental oxygen via nasal cannula.  No chest pain, lightheadedness or dizziness.  Had not eaten well went to go see her.  Is pleasantly confused and demented but in no acute distress and did not have any other concerns or complaints at this time  Objective: Vitals:   10/30/18 0548 10/30/18 0551 10/30/18 0943 10/30/18 1320  BP: 124/83  127/70 115/69  Pulse: (!) 59 80 69 60  Resp:    20  Temp:    97.6 F (36.4 C)  TempSrc:    Oral  SpO2:    99%  Weight:      Height:        Intake/Output Summary (Last 24 hours) at 10/30/2018 1351 Last data filed at 10/30/2018 1122 Gross per 24 hour  Intake 171 ml  Output 700 ml  Net -529 ml   Filed Weights   10/28/18 0349 10/29/18 0440 10/30/18 0444  Weight: 73.2 kg 70.8 kg 69.9 kg    Examination: Physical Exam:  Constitutional: Well-nourished, well-developed obese pleasantly demented Caucasian female currently no acute distress sitting up in bed about to eat and states she is no longer dyspneic Eyes: Sclerae anicteric.  Lids and conjunctive are normal ENMT: External ears and nose appear normal.  Slightly hard of hearing Neck: Appears supple no JVD Respiratory: Diminished to auscultation bilaterally with no appreciable wheezing, rales, rhonchi.  Patient does not have appreciable crackles today.  She has unlabored breathing but is still wearing event oxygen via nasal cannula Cardiovascular: Irregularly irregular but not tachycardic today.  Has a murmur as noted.  Has minimal lower extremity edema Abdomen: Soft, nontender, slightly distended secondary body habitus.  Bowel sounds present all 4 quadrants GU: Deferred Musculoskeletal: No contractures or cyanosis noted.  No joint deformities Skin: Skin is warm and dry no appreciable rashes or lesions on a limited skin evaluation Neurologic: Cranial nerves II through XII grossly intact no appreciable focal deficit  Psychiatric: Pleasantly demented but has a normal mood and affect.  She is awake and alert  Data Reviewed: I have personally reviewed following labs and imaging studies  CBC: Recent Labs  Lab 10/25/18 0943 10/26/18 0518 10/27/18 0302 10/28/18 0337 10/29/18 0322 10/30/18 0447  WBC 12.7* 12.6* 13.5* 10.4 12.3* 11.8*  NEUTROABS 9.9*  --  10.1* 7.8* 9.0* 8.9*  HGB 12.2 12.1 11.6* 11.8* 11.9* 12.4  HCT 38.5 38.0 37.4 37.6 38.0 39.6  MCV 90.6 90.0 93.3 92.6 92.7 92.7  PLT 350 342 341 327 365 226   Basic Metabolic Panel: Recent Labs  Lab 10/26/18 0518 10/27/18 0302 10/28/18 0337 10/29/18 0322 10/30/18 0447  NA 134* 133* 131* 131* 132*  K 4.0 4.3 4.8 4.5 4.5  CL 99 97* 96* 93* 92*  CO2 _0 GLUCOSE 93 119* 110* 108* 105*  BUN 19 25* 25* 24* 26*  CREATININE 1.03* 1.20* 1.05* 1.10* 1.03*   CALCIUM 8.9 8.8* 8.7* 9.0 9.0  MG  --  1.7 1.8 1.8 1.8  PHOS  --  4.9* 3.9 3.9 3.4   GFR: Estimated Creatinine Clearance: 33.4 mL/min (A) (by C-G formula based on SCr of 1.03 mg/dL (H)). Liver Function Tests: Recent Labs  Lab 10/25/18 0943 10/27/18 0302 10/28/18 0337 10/29/18 0322 10/30/18 0447  AST 32 28 34 36 32  ALT _1 ALKPHOS 162* 139* 148* 169* 158*  BILITOT 1.0 0.6 0.7 0.9 0.7  PROT 6.6 6.2* 6.0* 6.2* 6.3*  ALBUMIN 3.6 3.1* 3.0* 3.2* 3.2*   No results for input(s): LIPASE, AMYLASE in the last 168 hours. No results for input(s): AMMONIA in the last 168 hours. Coagulation Profile: No results for input(s): INR, PROTIME in the last 168 hours. Cardiac Enzymes: Recent Labs  Lab 10/25/18 1729 10/25/18 2307 10/26/18 0518  TROPONINI <0.03 <0.03 <0.03   BNP (last 3 results) No results for input(s): PROBNP in the last 8760 hours. HbA1C: No results for input(s): HGBA1C in the last 72 hours. CBG: No results for input(s): GLUCAP in the last 168 hours. Lipid Profile: No results for input(s): CHOL, HDL, LDLCALC, TRIG, CHOLHDL, LDLDIRECT in the last 72 hours. Thyroid Function Tests: No results for input(s): TSH, T4TOTAL, FREET4, T3FREE, THYROIDAB in the last 72 hours. Anemia Panel: No results for input(s): VITAMINB12, FOLATE, FERRITIN, TIBC, IRON, RETICCTPCT in the last 72 hours. Sepsis Labs: No results for input(s): PROCALCITON, LATICACIDVEN in the last 168 hours.  Recent Results (from the past 240 hour(s))  Culture, Urine     Status: Abnormal   Collection Time: 10/25/18 12:08 PM  Result Value Ref Range Status   Specimen Description   Final    URINE, RANDOM Performed at [facility] [address], [place], [place] [address]    Special Requests   Final    NONE Performed at [facility], [place] [address] [place], [place] [address]    Culture >=100,000 COLONIES/mL ESCHERICHIA COLI (A)  Final   Report Status  10/27/2018 FINAL  Final   Organism ID, Bacteria ESCHERICHIA COLI (A)  Final      Susceptibility   Escherichia coli - MIC*    AMPICILLIN 8 SENSITIVE Sensitive     CEFAZOLIN <=4 SENSITIVE Sensitive     CEFTRIAXONE <=1 SENSITIVE Sensitive     CIPROFLOXACIN <=0.25 SENSITIVE Sensitive     GENTAMICIN <=1 SENSITIVE Sensitive     IMIPENEM <=0.25 SENSITIVE Sensitive     NITROFURANTOIN <=16 SENSITIVE Sensitive  TRIMETH/SULFA <=20 SENSITIVE Sensitive     AMPICILLIN/SULBACTAM <=2 SENSITIVE Sensitive     PIP/TAZO <=4 SENSITIVE Sensitive     Extended ESBL NEGATIVE Sensitive     * >=100,000 COLONIES/mL ESCHERICHIA COLI  MRSA PCR Screening     Status: None   Collection Time: 10/25/18 12:58 PM  Result Value Ref Range Status   MRSA by PCR NEGATIVE NEGATIVE Final    Comment:        The GeneXpert MRSA Assay (FDA approved for NASAL specimens only), is one component of a comprehensive MRSA colonization surveillance program. It is not intended to diagnose MRSA infection nor to guide or monitor treatment for MRSA infections. Performed at [facility], [place] [address], [place], [place] [address]     Radiology Studies: Dg Chest Port 1 View  Result Date: 10/30/2018 CLINICAL DATA:  Kenzi Bardwell is a 84 y.o. female with medical history significant of heart failure, atrial fibrillation, dementia, hypertension, hyperlipidemia. Patient unable to provide history. She is unsure of why she is here. She does state that she is having shortness of breath of unknown time frame EXAM: PORTABLE CHEST 1 VIEW COMPARISON:  10/29/2018 FINDINGS: Cardiac silhouette is mildly enlarged. No mediastinal or hilar masses. Bilateral lung base opacities consistent with a combination of small effusions and probable atelectasis. Remainder of the lungs is clear. No pneumothorax. IMPRESSION: 1. Pulmonary edema essentially resolved. 2. Residual small pleural effusions with basilar atelectasis. Electronically  Signed   By: Lajean Manes M.D.   On: 10/30/2018 07:15   Dg Chest Port 1 View  Result Date: 10/29/2018 CLINICAL DATA:  Shortness of breath EXAM: PORTABLE CHEST 1 VIEW COMPARISON:  10/28/2018 FINDINGS: Enlarged cardiac silhouette as seen yesterday. Aortic atherosclerosis. There is radiographic improvement with less interstitial and alveolar edema and better aeration of the lower lungs. No worsening or new finding. IMPRESSION: Radiographic improvement with less edema. Electronically Signed   By: Nelson Chimes M.D.   On: 10/29/2018 06:49   Scheduled Meds: . amiodarone  400 mg Oral BID  . apixaban  5 mg Oral BID  . chlorhexidine  15 mL Mouth Rinse BID  . diltiazem  240 mg Oral Daily  . fluticasone  1 spray Each Nare Daily  . furosemide  40 mg Intravenous BID  . gabapentin  100 mg Oral BID  . mouth rinse  15 mL Mouth Rinse q12n4p  . metoprolol tartrate  25 mg Oral BID  . polyethylene glycol  17 g Oral BID  . potassium chloride  20 mEq Oral BID  . senna-docusate  1 tablet Oral BID  . sodium chloride flush  3 mL Intravenous Q12H   Continuous Infusions: . sodium chloride    .  ceFAZolin (ANCEF) IV 1 g (10/30/18 1116)    LOS: 4 days   Kerney Elbe, DO [facility] PAGER is on [place]  If 7PM-7AM, please contact night-coverage [email or web address] Password [email or web address] 10/30/2018, 1:51 PM

## 2018-10-31 DIAGNOSIS — E785 Hyperlipidemia, unspecified: Secondary | ICD-10-CM

## 2018-10-31 DIAGNOSIS — N179 Acute kidney failure, unspecified: Secondary | ICD-10-CM

## 2018-10-31 DIAGNOSIS — F0390 Unspecified dementia without behavioral disturbance: Secondary | ICD-10-CM

## 2018-10-31 LAB — CBC WITH DIFFERENTIAL/PLATELET
ABS IMMATURE GRANULOCYTES: 0.03 10*3/uL (ref 0.00–0.07)
BASOS PCT: 1 %
Basophils Absolute: 0.1 10*3/uL (ref 0.0–0.1)
Eosinophils Absolute: 0.4 10*3/uL (ref 0.0–0.5)
Eosinophils Relative: 5 %
HEMATOCRIT: 38.2 % (ref 36.0–46.0)
Hemoglobin: 12.2 g/dL (ref 12.0–15.0)
Immature Granulocytes: 0 %
LYMPHS ABS: 1.4 10*3/uL (ref 0.7–4.0)
Lymphocytes Relative: 17 %
MCH: 28.8 pg (ref 26.0–34.0)
MCHC: 31.9 g/dL (ref 30.0–36.0)
MCV: 90.3 fL (ref 80.0–100.0)
MONO ABS: 0.9 10*3/uL (ref 0.1–1.0)
MONOS PCT: 11 %
NEUTROS ABS: 5.5 10*3/uL (ref 1.7–7.7)
Neutrophils Relative %: 66 %
PLATELETS: 299 10*3/uL (ref 150–400)
RBC: 4.23 MIL/uL (ref 3.87–5.11)
RDW: 16.1 % — ABNORMAL HIGH (ref 11.5–15.5)
WBC: 8.3 10*3/uL (ref 4.0–10.5)
nRBC: 0 % (ref 0.0–0.2)

## 2018-10-31 LAB — COMPREHENSIVE METABOLIC PANEL
ALT: 19 U/L (ref 0–44)
AST: 28 U/L (ref 15–41)
Albumin: 3 g/dL — ABNORMAL LOW (ref 3.5–5.0)
Alkaline Phosphatase: 143 U/L — ABNORMAL HIGH (ref 38–126)
Anion gap: 9 (ref 5–15)
BUN: 24 mg/dL — ABNORMAL HIGH (ref 8–23)
CO2: 27 mmol/L (ref 22–32)
Calcium: 8.9 mg/dL (ref 8.9–10.3)
Chloride: 97 mmol/L — ABNORMAL LOW (ref 98–111)
Creatinine, Ser: 1 mg/dL (ref 0.44–1.00)
GFR calc Af Amer: 57 mL/min — ABNORMAL LOW (ref >60)
GFR calc non Af Amer: 50 mL/min — ABNORMAL LOW (ref >60)
Glucose, Bld: 90 mg/dL (ref 70–99)
Potassium: 4.3 mmol/L (ref 3.5–5.1)
Sodium: 133 mmol/L — ABNORMAL LOW (ref 135–145)
Total Bilirubin: 0.8 mg/dL (ref 0.3–1.2)
Total Protein: 5.7 g/dL — ABNORMAL LOW (ref 6.5–8.1)

## 2018-10-31 LAB — MAGNESIUM: Magnesium: 1.8 mg/dL (ref 1.7–2.4)

## 2018-10-31 LAB — PHOSPHORUS: PHOSPHORUS: 3.4 mg/dL (ref 2.5–4.6)

## 2018-10-31 NOTE — Progress Notes (Signed)
SATURATION QUALIFICATIONS: (This note is used to comply with regulatory documentation for home oxygen)  Patient Saturations on Room Air at Rest = 93%  Patient Saturations on Room Air while Ambulating = 95%  Patient Saturations on 0 Liters of oxygen while Ambulating =   Please briefly explain why patient needs home oxygen: Pt does not meet criteria for home O2.   Pt has dementia; appears to be more of an anxiety component; DR requested O2 sat screen. Pt amb  in hallway 4x today: 2x w/NT, once w/PT, once with RN

## 2018-10-31 NOTE — Progress Notes (Addendum)
Progress Note  Patient Name: Nicole Barker Date of Encounter: 10/31/2018  Primary Cardiologist: Kirk Ruths, MD   Subjective   Patient sitting up on the side of the bed preparing to bathe.  She states that she feels better than when she was admitted but is still short of breath with minimal exertion.  Still using oxygen by nasal cannula.  Inpatient Medications    Scheduled Meds: . amiodarone  400 mg Oral BID  . apixaban  5 mg Oral BID  . chlorhexidine  15 mL Mouth Rinse BID  . diltiazem  240 mg Oral Daily  . fluticasone  1 spray Each Nare Daily  . furosemide  40 mg Intravenous BID  . gabapentin  100 mg Oral BID  . mouth rinse  15 mL Mouth Rinse q12n4p  . metoprolol tartrate  25 mg Oral BID  . polyethylene glycol  17 g Oral BID  . potassium chloride  20 mEq Oral BID  . senna-docusate  1 tablet Oral BID  . sodium chloride flush  3 mL Intravenous Q12H   Continuous Infusions: . sodium chloride    .  ceFAZolin (ANCEF) IV 1 g (10/30/18 2059)   PRN Meds: sodium chloride, acetaminophen, hydrALAZINE, ondansetron (ZOFRAN) IV, sodium chloride flush, traMADol   Vital Signs    Vitals:   10/30/18 1320 10/30/18 2036 10/31/18 0435 10/31/18 0454  BP: 115/69 (!) 135/96 (!) 143/89   Pulse: 60 70 82   Resp: 20 18 (!) 21   Temp: 97.6 F (36.4 C)  98.5 F (36.9 C)   TempSrc: Oral     SpO2: 99% 100% 100%   Weight:    69.8 kg  Height:        Intake/Output Summary (Last 24 hours) at 10/31/2018 0831 Last data filed at 10/31/2018 0455 Gross per 24 hour  Intake 361 ml  Output 800 ml  Net -439 ml   Filed Weights   10/29/18 0440 10/30/18 0444 10/31/18 0454  Weight: 70.8 kg 69.9 kg 69.8 kg    Telemetry    A. fib in the 70s with occasional PVCs- Personally Reviewed  ECG    No new tracings- Personally Reviewed  Physical Exam   GEN: No acute distress.   Neck: No JVD Cardiac:  Irregularly irregular rhythm, no murmurs, rubs, or gallops.  Respiratory: Clear to  auscultation bilaterally. GI: Soft, nontender, non-distended  MS:  Trace ankle edema; No deformity. Neuro:  Nonfocal  Psych: Normal affect   Labs    Chemistry Recent Labs  Lab 10/29/18 0322 10/30/18 0447 10/31/18 0504  NA 131* 132* 133*  K 4.5 4.5 4.3  CL 93* 92* 97*  CO2 27 30 27   GLUCOSE 108* 105* 90  BUN 24* 26* 24*  CREATININE 1.10* 1.03* 1.00  CALCIUM 9.0 9.0 8.9  PROT 6.2* 6.3* 5.7*  ALBUMIN 3.2* 3.2* 3.0*  AST 36 32 28  ALT 28 21 19   ALKPHOS 169* 158* 143*  BILITOT 0.9 0.7 0.8  GFRNONAA 44* 48* 50*  GFRAA 51* 55* 57*  ANIONGAP 11 10 9      Hematology Recent Labs  Lab 10/29/18 0322 10/30/18 0447 10/31/18 0504  WBC 12.3* 11.8* 8.3  RBC 4.10 4.27 4.23  HGB 11.9* 12.4 12.2  HCT 38.0 39.6 38.2  MCV 92.7 92.7 90.3  MCH 29.0 29.0 28.8  MCHC 31.3 31.3 31.9  RDW 16.6* 16.3* 16.1*  PLT 365 358 299    Cardiac Enzymes Recent Labs  Lab 10/25/18 1729 10/25/18 2307 10/26/18 0518  TROPONINI <0.03 <0.03 <0.03    Recent Labs  Lab 10/25/18 0949  TROPIPOC 0.00     BNP Recent Labs  Lab 10/25/18 0943  BNP 1,010.4*     DDimer No results for input(s): DDIMER in the last 168 hours.   Radiology    Dg Chest Port 1 View  Result Date: 10/30/2018 CLINICAL DATA:  Nicole Barker is a 84 y.o. female with medical history significant of heart failure, atrial fibrillation, dementia, hypertension, hyperlipidemia. Patient unable to provide history. She is unsure of why she is here. She does state that she is having shortness of breath of unknown time frame EXAM: PORTABLE CHEST 1 VIEW COMPARISON:  10/29/2018 FINDINGS: Cardiac silhouette is mildly enlarged. No mediastinal or hilar masses. Bilateral lung base opacities consistent with a combination of small effusions and probable atelectasis. Remainder of the lungs is clear. No pneumothorax. IMPRESSION: 1. Pulmonary edema essentially resolved. 2. Residual small pleural effusions with basilar atelectasis. Electronically  Signed   By: Lajean Manes M.D.   On: 10/30/2018 07:15    Cardiac Studies   Echocardiogram 10/25/2018: Study Conclusions  - Left ventricle: The cavity size was normal. Wall thickness was increased in a pattern of mild LVH. Systolic function was mildly reduced. The estimated ejection fraction was in the range of 45% to 50%. Diffuse hypokinesis. Doppler parameters are consistent with high ventricular filling pressure. - Aortic valve: Valve mobility was restricted. There was mild stenosis. There was mild regurgitation. - Mitral valve: Calcified annulus. The findings are consistent with mild stenosis. There was moderate regurgitation. - Left atrium: The atrium was mildly dilated. - Tricuspid valve: There was moderate regurgitation. - Pulmonary arteries: Systolic pressure was moderately to severely increased. PA peak pressure: 65 mm Hg (S). - Pericardium, extracardiac: There was a left pleural effusion.  Impressions:  - Mild global reduction in LV systolic function; mild LVH; calcified aortic valve with mild AS (mean gradient 15 mmHg) and mild AI; MAC with mild MS (mean gradient 5 mmHg); moderate MR; mild LAE; moderate TR; moderate to severe pulmonary hypertension.   Patient Profile     84 y.o. female  now at [facility] for [facility],with a reportedhx of heart failure, atrial fibrillation, chronic anticoagulation, HTN, HLD, dementia and asthmawho is beingfollowedfor the evaluation ofacute combined systolic and diastolic heart failure and atrial fibrillationw/ RVR.  Assessment & Plan    Persistent atrial fibrillation  -Previously treated with Multaq which was discontinued on 16/1 due to systolic heart failure. -Heart rates were difficult to control on metoprolol and Cardizem so amiodarone was added on Friday. Remains in atrial fibrillation with good rate control in the 70s -On Eliquis for stroke risk reduction -Objectively the patient's heart  failure is improving however she continues to have shortness of breath with minimal exertion even with good rate control.  She may benefit from a TEE guided DCCV to restore sinus rhythm.  I will discuss with Dr. Meda Coffee.  Acute on chronic combined systolic and diastolic CHF -Likely exacerbated by atrial fibrillation with RVR. BNP was 1,010 at presentation -She has continued diuresing on Lasix 40 mg IV twice daily -Chest x-ray done yesterday shows pulmonary edema essentially resolved.  Small residual pleural effusions continue. -She has net -5.4 L fluid balance -Can convert Lasix to oral dosing and watch -Patient continues to be short of breath with minimal exertion, possibly related to atrial fibrillation.  Cardiomyopathy -Reported in past medical history.  Echo this admission showed mildly reduced LVEF at 45-50% -Beta-blocker was added.  Her home ARB was initially held due to renal function, which is now back to normal -With improved renal function and blood pressure normal to mildly elevated, recommend resuming her home ARB at a reduced dose considering the addition of BB, CCB and amiodarone  AKI -Renal function now improved with serum creatinine 1.00  For questions or updates, please contact [facility] Please consult [email or web address] for contact info under     Signed, Daune Perch, NP  10/31/2018, 8:31 AM    The patient was seen, examined and discussed with Daune Perch, NP-C and I agree with the above.   She feels better, weight down 400 cc overnight, she does not know her baseline weight, on physical exam she does not have any JVDs, lungs are clear, and legs have minimal lower extremity edema.  Creatinine has actually improved.  Telemetry shows atrial fibrillation that is rate controlled.  The patient states that she feels much better than on admission but not yet back to normal.  I will start physical therapy, continue IV Lasix tonight switch to p.o. tomorrow, patient should be  able to be discharged tomorrow.  She was previously on furosemide 20 mg daily, I would switch to 40 mg p.o. daily on discharge.

## 2018-10-31 NOTE — Progress Notes (Addendum)
Physical Therapy Treatment Patient Details Name: Nicole Barker MRN: [id number] DOB: March 21, 1930 Today's Date: 10/31/2018    History of Present Illness Pt was admitted with dyspnea. Dx of CHF, respiratory failure, AKI, UTI.  PMH:  A fib, heart failure, HTN and dementia    PT Comments    Pt ambulated 180' with RW, SaO2 82% (however dynamap had low battery, so ? accuracy of reading) on room air walking, 95% on room air at rest. RN stated she would attempt to get ambulating SaO2 levels later today. Pt reports she's, "having trouble breathing" at rest and while walking, but no dyspnea noted at any time during PT session. Pt was able to hold conversation while walking without difficulty.    Follow Up Recommendations  Home health PT(at [facility]F)     Equipment Recommendations  None recommended by PT    Recommendations for Other Services       Precautions / Restrictions Precautions Precautions: Fall Precaution Comments: monitor vitals  Restrictions Weight Bearing Restrictions: No    Mobility  Bed Mobility               General bed mobility comments: up in chair  Transfers Overall transfer level: Needs assistance Equipment used: Rolling walker (2 wheeled) Transfers: Sit to/from Stand Sit to Stand: Min guard         General transfer comment: VCs for hand placement, min/guard safety  Ambulation/Gait Ambulation/Gait assistance: Min guard Gait Distance (Feet): 180 Feet Assistive device: Rolling walker (2 wheeled) Gait Pattern/deviations: Step-through pattern;Decreased stride length;Trunk flexed     General Gait Details: frequent VCs to step closer to RW, no loss of balance, SaO2 82% on room air while walking, 95% on room air at rest, dynamap lost battery charge during ambulation so unable to titrate O2   Stairs             Wheelchair Mobility    Modified Rankin (Stroke Patients Only)       Balance Overall balance assessment: Needs assistance;History of  Falls   Sitting balance-Leahy Scale: Good       Standing balance-Leahy Scale: Fair                              Cognition Arousal/Alertness: Awake/alert Behavior During Therapy: WFL for tasks assessed/performed Overall Cognitive Status: History of cognitive impairments - at baseline                                 General Comments: very pleasant and motivated       Exercises      General Comments        Pertinent Vitals/Pain Pain Assessment: No/denies pain    Home Living                      Prior Function            PT Goals (current goals can now be found in the care plan section) Acute Rehab PT Goals Patient Stated Goal: to see her chihuahua PT Goal Formulation: With patient Time For Goal Achievement: 11/09/18 Potential to Achieve Goals: Good Progress towards PT goals: Progressing toward goals    Frequency    Min 3X/week      PT Plan Current plan remains appropriate    Co-evaluation              AM-PAC PT "  6 Clicks" Daily Activity  Outcome Measure  Difficulty turning over in bed (including adjusting bedclothes, sheets and blankets)?: Unable Difficulty moving from lying on back to sitting on the side of the bed? : Unable Difficulty sitting down on and standing up from a chair with arms (e.g., wheelchair, bedside commode, etc,.)?: Unable Help needed moving to and from a bed to chair (including a wheelchair)?: A Little Help needed walking in hospital room?: A Little Help needed climbing 3-5 steps with a railing? : A Lot 6 Click Score: 11    End of Session Equipment Utilized During Treatment: Gait belt Activity Tolerance: Patient tolerated treatment well Patient left: with call bell/phone within reach;in chair;with chair alarm set Nurse Communication: Mobility status PT Visit Diagnosis: Unsteadiness on feet (R26.81)     Time: 3875-6433 PT Time Calculation (min) (ACUTE ONLY): 20 min  Charges:  $Gait  Training: 8-22 mins                    Blondell Reveal Kistler PT 10/31/2018  Acute Rehabilitation Services Pager [phone] Office [phone]

## 2018-10-31 NOTE — Progress Notes (Signed)
PROGRESS NOTE    Nicole Barker  BSJ:[id number] DOB: 1930-02-16 DOA: 10/25/2018 PCP: Nicole San, MD   Brief Narrative:  HPI per Dr. Cordelia Barker on 10/25/18 HPI: Nicole Barker is a 84 y.o. female with medical history significant of heart failure, atrial fibrillation, dementia, hypertension, hyperlipidemia. Patient unable to provide history. She is unsure of why she is here. She does state that she is having shortness of breath of unknown time frame. She also has some frequency.  ED Course: Vitals: Afebrile, tachycardia, tachypnea, hypertensive, on room air Labs: sodium of 132, creatinine of 1.01, alkaline phosphatase of 162, BNP of 1010, WBC of 12.7 Imaging: Chest x-ray significant for cardiomegaly, edema, bilateral effusion, atelectasis Medications/Course: Albuterol, lasix, lopressor, given  **Patient feels improved from yesterday and is not as dyspneic.  Denies any nausea or vomiting.  No other concerns or complaints at this time.  Cardiology reduced her diuresis 10/27/18 currently given her slight bump in creatinine but increased it back to IV 40 mg BID to to worsened dyspnea. Cardiology changed Cardizem to CD (10/30/18) and recommending continuing IV Diuresis.   Assessment & Plan:   Principal Problem:   Acute heart failure (HCC) Active Problems:   Atrial fibrillation with RVR (HCC)   Dementia without behavioral disturbance (HCC)   Essential hypertension   Hyperlipidemia   AKI (acute kidney injury) ([place])  Acute Respiratory Failure with Hypoxia in the setting of Acute Decompensated Combined Systolic and Diastolic CHF with an EF of 45-50%, improving -Continue supplemental oxygen via nasal cannula and wean O2 as tolerated; Was seen wearing O2 this AM again -Continuous pulse oximetry and maintain O2 saturations greater than 92% -Initial chest x-ray showed cardiomegaly with mild edema and bilateral effusions suggesting congestive heart failure and bibasilar  airspace disease that reflected atelectasis greater on the left than the right -Repeat CXR yesterday AM showed "Cardiac silhouette is mildly enlarged. No mediastinal or hilar masses. Bilateral lung base opacities consistent with a combination of small effusions and probable atelectasis. Remainder of the lungs is clear." -Continue with diuresis at IV 40 mg BID for now and continue to follow Urine output and monitor Renal Fxn  -We will need home Ambulatory screen prior to discharge  Acute on Chronic combined systolic and diastolic CHF with an EF of 40 to 45% -On lasix, losartan and potassium as an outpatient. -BNP on Admission was 1,010.4  -Troponin negative x3 No chest pain.  -Weight on admission of 145 lbs -C/w Lasix 40 mg IV BID again with potassium supplementation -Strict in and out/daily weights -Watch kidney function while being diuresed -Transthoracic Echocardiogram as below -Oxygen as needed to keep O2 >94% -Continue to Hold losartan until creatinine improves or appears stable -Patient is -5.411 L since admission; however question of weights are accurate and his weight has gone up to 161 and now coming down to 153 -Because of her dyspnea Cardiology increased it back to IV 40 mg twice daily from IV 40 daily a few days ago and will continue again today and cardiology she should be able to be switched to p.o. Lasix tomorrow and be able to discharge.  She was previously on furosemide 20 mg daily and will switch to 40 mils p.o. daily on discharge -Continue to monitor volume status daily -Cardiology consulted for further evaluation recommendations and patient started on metoprolol 25 mg p.o. twice daily we will continue -Fluid Restrict Patient to 1500 mL  Atrial fibrillation with RVR -Improved and is now Rate Controlled. Possibly contributed to acute  heart failure -Stopped Dronedarone as contraindicated in patient with CHF but will continue with apixaban for anticoagulation -Continue with  telemetry when moved out of SDU -Cardiology evaluated and added Metoprolol 25 mg p.o. twice daily -Off of Cardizem gtt and Cardiology change Cardizem 60 mg q6h -> Cardizem CD 240 mg po Daily -Per cardiology if she is symptomatic despite being rate controlled will need to consider adding amiodarone and proceeding with cardioversion as well as continue with apixaban for anticoagulation -We will need to obtain outside records to see if she has been in sinus recently and she has may be reasonable to proceed with cardioversion however this will need to be verified  -CHA2DS2-VASc is elevated at 5 -Cardiology feels as if A Fib with RVR is contributing to Heart Failure so they have added Amiodarone 400 mg po BID -Per cardiology if the CHF does not improve her heart rate is difficult control we will need to pursue a TEE guided cardioversion early next week but not entirely clear this will need to be pursed as her HR is better controlled and she is clinically improved  Acute kidney injury but suspect Chronic Kidney Disease, improved -No baseline. Likely in setting of heart failure. CrCl of 29.1 -C/w IV Lasix; Dose was reduced to IV 40 mg Daily but will increase back to BID dosing given worsened dyspnea yesterday; Will continue today  -Adjust medications for decreased function -BUN/Cr went from 12/1.01 -> 19/1.03 -> 25/1.20 -> 25/1.05 -> 24/1.10 -> 26/1.03 -> 24/1.00 -Continue to Monitor and Trend Renal Function -Repeat CMP in AM   Urinary frequency with E Coli UTI -Urinalysis showed clear urine small hemoglobin, small leukocytes, positive nitrites, many bacteria, 0-5 WBCs -Urine culture showed greater than 100,000 colonies gram-negative rods and it was E Coli that was Pan-sensitive -Changed empiric IV Ceftriaxone to Cefazolin to Narrow Treatment and will continue IV Cefazolin  -This is Day 7/7 Abx   Dementia -Does not appear to be on medications. Currently resides at [facility] but states she  lives with her daughter. -Has periods of confusion -Delirium Precautions -Social work consulted   Essential Hypertension -Uncontrolled in setting of fluid overload. -Hold Losartan for now and cardiology has added Metoprolol 25 mg p.o. twice daily -Lasix as above  -Cardiology Starting patient on po Cardizem   Hyponatremia -In setting of volume overload -Continue with diuresis and sodium is now 133 -Continue monitor and repeat CMP in a.m.  Valvular Heart Disease -Seen on ECHO -Cardiology following and recommending conservative measures   Leukocytosis -Patient is Afebrile but WBC had gone from 12.6 -> 13.5 -> 10.5 -> 12.3 -> 11.8 -> 8.3 ; Continues to Flutate -? Hemoconcentration in the setting of Lasix Use -Not on Any steroids -Urine Cx + for E Coli -No Blood Cx's done but will obtain if spikes a temperature -CXR not indicative of PNA -Continue to Monitor for S/Sx of Infection -Repeat CBC in AM   Obesity -Estimated body mass index is 31.08 kg/m as calculated from the following:   Height as of this encounter: _0  (1.499 m).   Weight as of this encounter: 69.8 kg. -Weight Loss Counseling given  DVT prophylaxis: Anticoagulated with Apixaban  Code Status: Deferred Family Communication: No family present at bedside Disposition Plan: [facility] at ALF when medically stable to D/C; Currently stable to transfer out of the SDU  Consultants:  Cardiology   Procedures:  ECHOCARDIOGRAM 10/25/18 ------------------------------------------------------------------- Study Conclusions  - Left ventricle: The cavity size was normal.  Wall thickness was   increased in a pattern of mild LVH. Systolic function was mildly   reduced. The estimated ejection fraction was in the range of 45%   to 50%. Diffuse hypokinesis. Doppler parameters are consistent   with high ventricular filling pressure. - Aortic valve: Valve mobility was restricted. There was mild   stenosis. There was  mild regurgitation. - Mitral valve: Calcified annulus. The findings are consistent with   mild stenosis. There was moderate regurgitation. - Left atrium: The atrium was mildly dilated. - Tricuspid valve: There was moderate regurgitation. - Pulmonary arteries: Systolic pressure was moderately to severely   increased. PA peak pressure: 65 mm Hg (S). - Pericardium, extracardiac: There was a left pleural effusion.  Impressions:  - Mild global reduction in LV systolic function; mild LVH;   calcified aortic valve with mild AS (mean gradient 15 mmHg) and   mild AI; MAC with mild MS (mean gradient 5 mmHg); moderate MR;   mild LAE; moderate TR; moderate to severe pulmonary hypertension   Antimicrobials:  Anti-infectives (From admission, onward)   Start     Dose/Rate Route Frequency Ordered Stop   10/27/18 2200  ceFAZolin (ANCEF) IVPB 1 g/50 mL premix     1 g 100 mL/hr over 30 Minutes Intravenous Every 12 hours 10/27/18 0907     10/25/18 2200  cefTRIAXone (ROCEPHIN) 1 g in sodium chloride 0.9 % 100 mL IVPB  Status:  Discontinued     1 g 200 mL/hr over 30 Minutes Intravenous Every 24 hours 10/25/18 2144 10/27/18 8250     Subjective: Seen and examined bedside and was doing well.  Still complains of some shortness of breath but states her leg swelling is improved.  Will wear oxygen when I went to a serious morning.  No chest pain, lightheadedness or dizziness..  Patient is pleasantly demented.  No other concerns or complaints at this time  Objective: Vitals:   10/30/18 1320 10/30/18 2036 10/31/18 0435 10/31/18 0454  BP: 115/69 (!) 135/96 (!) 143/89   Pulse: 60 70 82   Resp: 20 18 (!) 21   Temp: 97.6 F (36.4 C)  98.5 F (36.9 C)   TempSrc: Oral     SpO2: 99% 100% 100%   Weight:    69.8 kg  Height:        Intake/Output Summary (Last 24 hours) at 10/31/2018 1254 Last data filed at 10/31/2018 0455 Gross per 24 hour  Intake 240 ml  Output 800 ml  Net -560 ml   Filed Weights    10/29/18 0440 10/30/18 0444 10/31/18 0454  Weight: 70.8 kg 69.9 kg 69.8 kg   Examination: Physical Exam:  Constitutional: Well-nourished, well-developed obese pleasantly demented Caucasian female currently no acute distress she is in the chair at bedside with no complaints Eyes: Sclera is anicteric.  Lids and conjunctive are normal ENMT: External ears and nose appear normal.  Slightly hard of hearing Neck: Appears supple no JVD Respiratory: Diminished to auscultation bilaterally with no appreciable wheezing, rales, rhonchi.  Patient's crackles have improved.  She had unlabored breathing but was still wearing oxygen via nasal cannula Cardiovascular: Irregularly irregular but is rate controlled.  Has a murmur that was noted.  Has no real appreciable edema today Abdomen: Soft, nontender, slightly distended secondary body habitus.  Bowel sounds present all 4 quadrants GU: Deferred Musculoskeletal: No contractures or cyanosis noted.  No joint was noted Skin: Skin is warm and dry no appreciable rashes lesions limited skin evaluation Neurologic: Cranial  nerves II through XII grossly intact no appreciable focal deficits Psychiatric: Pleasantly demented but has a normal mood and affect.  Intact judgment intact.  Data Reviewed: I have personally reviewed following labs and imaging studies  CBC: Recent Labs  Lab 10/27/18 0302 10/28/18 0337 10/29/18 0322 10/30/18 0447 10/31/18 0504  WBC 13.5* 10.4 12.3* 11.8* 8.3  NEUTROABS 10.1* 7.8* 9.0* 8.9* 5.5  HGB 11.6* 11.8* 11.9* 12.4 12.2  HCT 37.4 37.6 38.0 39.6 38.2  MCV 93.3 92.6 92.7 92.7 90.3  PLT 341 327 365 358 950   Basic Metabolic Panel: Recent Labs  Lab 10/27/18 0302 10/28/18 0337 10/29/18 0322 10/30/18 0447 10/31/18 0504  NA 133* 131* 131* 132* 133*  K 4.3 4.8 4.5 4.5 4.3  CL 97* 96* 93* 92* 97*  CO2 _0 GLUCOSE 119* 110* 108* 105* 90  BUN 25* 25* 24* 26* 24*  CREATININE 1.20* 1.05* 1.10* 1.03* 1.00  CALCIUM 8.8*  8.7* 9.0 9.0 8.9  MG 1.7 1.8 1.8 1.8 1.8  PHOS 4.9* 3.9 3.9 3.4 3.4   GFR: Estimated Creatinine Clearance: 34.3 mL/min (by C-G formula based on SCr of 1 mg/dL). Liver Function Tests: Recent Labs  Lab 10/27/18 0302 10/28/18 0337 10/29/18 0322 10/30/18 0447 10/31/18 0504  AST 28 34 36 32 28  ALT _1 ALKPHOS 139* 148* 169* 158* 143*  BILITOT 0.6 0.7 0.9 0.7 0.8  PROT 6.2* 6.0* 6.2* 6.3* 5.7*  ALBUMIN 3.1* 3.0* 3.2* 3.2* 3.0*   No results for input(s): LIPASE, AMYLASE in the last 168 hours. No results for input(s): AMMONIA in the last 168 hours. Coagulation Profile: No results for input(s): INR, PROTIME in the last 168 hours. Cardiac Enzymes: Recent Labs  Lab 10/25/18 1729 10/25/18 2307 10/26/18 0518  TROPONINI <0.03 <0.03 <0.03   BNP (last 3 results) No results for input(s): PROBNP in the last 8760 hours. HbA1C: No results for input(s): HGBA1C in the last 72 hours. CBG: No results for input(s): GLUCAP in the last 168 hours. Lipid Profile: No results for input(s): CHOL, HDL, LDLCALC, TRIG, CHOLHDL, LDLDIRECT in the last 72 hours. Thyroid Function Tests: No results for input(s): TSH, T4TOTAL, FREET4, T3FREE, THYROIDAB in the last 72 hours. Anemia Panel: No results for input(s): VITAMINB12, FOLATE, FERRITIN, TIBC, IRON, RETICCTPCT in the last 72 hours. Sepsis Labs: No results for input(s): PROCALCITON, LATICACIDVEN in the last 168 hours.  Recent Results (from the past 240 hour(s))  Culture, Urine     Status: Abnormal   Collection Time: 10/25/18 12:08 PM  Result Value Ref Range Status   Specimen Description   Final    URINE, RANDOM Performed at [facility] [address] [place], [place] [address]    Special Requests   Final    NONE Performed at [facility], [place] [address] [place], [place] [address]    Culture >=100,000 COLONIES/mL ESCHERICHIA COLI (A)  Final   Report Status 10/27/2018 FINAL  Final    Organism ID, Bacteria ESCHERICHIA COLI (A)  Final      Susceptibility   Escherichia coli - MIC*    AMPICILLIN 8 SENSITIVE Sensitive     CEFAZOLIN <=4 SENSITIVE Sensitive     CEFTRIAXONE <=1 SENSITIVE Sensitive     CIPROFLOXACIN <=0.25 SENSITIVE Sensitive     GENTAMICIN <=1 SENSITIVE Sensitive     IMIPENEM <=0.25 SENSITIVE Sensitive     NITROFURANTOIN <=16 SENSITIVE Sensitive     TRIMETH/SULFA <=20 SENSITIVE Sensitive  AMPICILLIN/SULBACTAM <=2 SENSITIVE Sensitive     PIP/TAZO <=4 SENSITIVE Sensitive     Extended ESBL NEGATIVE Sensitive     * >=100,000 COLONIES/mL ESCHERICHIA COLI  MRSA PCR Screening     Status: None   Collection Time: 10/25/18 12:58 PM  Result Value Ref Range Status   MRSA by PCR NEGATIVE NEGATIVE Final    Comment:        The GeneXpert MRSA Assay (FDA approved for NASAL specimens only), is one component of a comprehensive MRSA colonization surveillance program. It is not intended to diagnose MRSA infection nor to guide or monitor treatment for MRSA infections. Performed at [facility], [place] [address], [place], [place] [address]     Radiology Studies: Dg Chest Port 1 View  Result Date: 10/30/2018 CLINICAL DATA:  Hazleigh Mccleave is a 84 y.o. female with medical history significant of heart failure, atrial fibrillation, dementia, hypertension, hyperlipidemia. Patient unable to provide history. She is unsure of why she is here. She does state that she is having shortness of breath of unknown time frame EXAM: PORTABLE CHEST 1 VIEW COMPARISON:  10/29/2018 FINDINGS: Cardiac silhouette is mildly enlarged. No mediastinal or hilar masses. Bilateral lung base opacities consistent with a combination of small effusions and probable atelectasis. Remainder of the lungs is clear. No pneumothorax. IMPRESSION: 1. Pulmonary edema essentially resolved. 2. Residual small pleural effusions with basilar atelectasis. Electronically Signed   By: Lajean Manes  M.D.   On: 10/30/2018 07:15   Scheduled Meds: . amiodarone  400 mg Oral BID  . apixaban  5 mg Oral BID  . chlorhexidine  15 mL Mouth Rinse BID  . diltiazem  240 mg Oral Daily  . fluticasone  1 spray Each Nare Daily  . furosemide  40 mg Intravenous BID  . gabapentin  100 mg Oral BID  . mouth rinse  15 mL Mouth Rinse q12n4p  . metoprolol tartrate  25 mg Oral BID  . polyethylene glycol  17 g Oral BID  . potassium chloride  20 mEq Oral BID  . senna-docusate  1 tablet Oral BID  . sodium chloride flush  3 mL Intravenous Q12H   Continuous Infusions: . sodium chloride    .  ceFAZolin (ANCEF) IV 1 g (10/31/18 1100)    LOS: 5 days   Kerney Elbe, DO [facility] PAGER is on [place]  If 7PM-7AM, please contact night-coverage [email or web address] Password [email or web address] 10/31/2018, 12:54 PM

## 2018-11-01 ENCOUNTER — Other Ambulatory Visit: Payer: Self-pay | Admitting: Cardiology

## 2018-11-01 ENCOUNTER — Inpatient Hospital Stay (HOSPITAL_COMMUNITY): Payer: Medicare Other

## 2018-11-01 DIAGNOSIS — I10 Essential (primary) hypertension: Secondary | ICD-10-CM

## 2018-11-01 DIAGNOSIS — I130 Hypertensive heart and chronic kidney disease with heart failure and stage 1 through stage 4 chronic kidney disease, or unspecified chronic kidney disease: Secondary | ICD-10-CM

## 2018-11-01 LAB — CBC WITH DIFFERENTIAL/PLATELET
Abs Immature Granulocytes: 0.04 10*3/uL (ref 0.00–0.07)
BASOS PCT: 1 %
Basophils Absolute: 0.1 10*3/uL (ref 0.0–0.1)
EOS ABS: 0.3 10*3/uL (ref 0.0–0.5)
EOS PCT: 4 %
HEMATOCRIT: 37.5 % (ref 36.0–46.0)
Hemoglobin: 11.6 g/dL — ABNORMAL LOW (ref 12.0–15.0)
IMMATURE GRANULOCYTES: 0 %
LYMPHS ABS: 1.3 10*3/uL (ref 0.7–4.0)
Lymphocytes Relative: 14 %
MCH: 28.8 pg (ref 26.0–34.0)
MCHC: 30.9 g/dL (ref 30.0–36.0)
MCV: 93.1 fL (ref 80.0–100.0)
MONO ABS: 0.9 10*3/uL (ref 0.1–1.0)
MONOS PCT: 10 %
Neutro Abs: 6.4 10*3/uL (ref 1.7–7.7)
Neutrophils Relative %: 71 %
PLATELETS: 306 10*3/uL (ref 150–400)
RBC: 4.03 MIL/uL (ref 3.87–5.11)
RDW: 15.9 % — AB (ref 11.5–15.5)
WBC: 9 10*3/uL (ref 4.0–10.5)
nRBC: 0 % (ref 0.0–0.2)

## 2018-11-01 LAB — COMPREHENSIVE METABOLIC PANEL
ALT: 17 U/L (ref 0–44)
ANION GAP: 8 (ref 5–15)
AST: 27 U/L (ref 15–41)
Albumin: 3.4 g/dL — ABNORMAL LOW (ref 3.5–5.0)
Alkaline Phosphatase: 145 U/L — ABNORMAL HIGH (ref 38–126)
BILIRUBIN TOTAL: 0.6 mg/dL (ref 0.3–1.2)
BUN: 27 mg/dL — ABNORMAL HIGH (ref 8–23)
CHLORIDE: 95 mmol/L — AB (ref 98–111)
CO2: 29 mmol/L (ref 22–32)
CREATININE: 1.06 mg/dL — AB (ref 0.44–1.00)
Calcium: 9.4 mg/dL (ref 8.9–10.3)
GFR calc Af Amer: 54 mL/min — ABNORMAL LOW (ref >60)
GFR calc non Af Amer: 46 mL/min — ABNORMAL LOW (ref >60)
GLUCOSE: 98 mg/dL (ref 70–99)
Potassium: 4.8 mmol/L (ref 3.5–5.1)
Sodium: 132 mmol/L — ABNORMAL LOW (ref 135–145)
Total Protein: 6.1 g/dL — ABNORMAL LOW (ref 6.5–8.1)

## 2018-11-01 LAB — MAGNESIUM: Magnesium: 2 mg/dL (ref 1.7–2.4)

## 2018-11-01 LAB — PHOSPHORUS: Phosphorus: 3.3 mg/dL (ref 2.5–4.6)

## 2018-11-01 MED ORDER — SPIRONOLACTONE 25 MG PO TABS: 12.5000 mg | ORAL_TABLET | Freq: Every day | ORAL | 0 refills | Status: DC

## 2018-11-01 MED ORDER — FUROSEMIDE 40 MG PO TABS: 40.0000 mg | ORAL_TABLET | Freq: Every day | ORAL | 0 refills | Status: DC

## 2018-11-01 MED ORDER — POLYETHYLENE GLYCOL 3350 17 G PO PACK: 17.0000 g | PACK | Freq: Every day | ORAL | 0 refills | Status: AC

## 2018-11-01 MED ORDER — LOSARTAN POTASSIUM 25 MG PO TABS
25.0000 mg | ORAL_TABLET | Freq: Every day | ORAL | Status: DC
  Administered 2018-11-01: 25 mg via ORAL
  Filled 2018-11-01: qty 1

## 2018-11-01 MED ORDER — FUROSEMIDE 40 MG PO TABS
40.0000 mg | ORAL_TABLET | Freq: Every day | ORAL | Status: DC
  Filled 2018-11-01: qty 1

## 2018-11-01 MED ORDER — TRAMADOL HCL 50 MG PO TABS: 50.0000 mg | ORAL_TABLET | Freq: Two times a day (BID) | ORAL | 0 refills | Status: DC | PRN

## 2018-11-01 MED ORDER — ACETAMINOPHEN 325 MG PO TABS: 650.0000 mg | ORAL_TABLET | Freq: Four times a day (QID) | ORAL | 0 refills | Status: AC | PRN

## 2018-11-01 MED ORDER — FLUTICASONE PROPIONATE 50 MCG/ACT NA SUSP: 1.0000 | Freq: Every day | NASAL | 2 refills | Status: DC

## 2018-11-01 MED ORDER — SENNOSIDES-DOCUSATE SODIUM 8.6-50 MG PO TABS: 1.0000 | ORAL_TABLET | Freq: Every evening | ORAL | 0 refills | Status: AC | PRN

## 2018-11-01 MED ORDER — METOPROLOL TARTRATE 25 MG PO TABS: 25.0000 mg | ORAL_TABLET | Freq: Two times a day (BID) | ORAL | 0 refills | Status: DC

## 2018-11-01 MED ORDER — AMIODARONE HCL 200 MG PO TABS: 200.0000 mg | ORAL_TABLET | Freq: Every day | ORAL | Status: DC

## 2018-11-01 MED ORDER — LOSARTAN POTASSIUM 25 MG PO TABS: 25.0000 mg | ORAL_TABLET | Freq: Every day | ORAL | 0 refills | Status: DC

## 2018-11-01 MED ORDER — DILTIAZEM HCL ER COATED BEADS 240 MG PO CP24: 240.0000 mg | ORAL_CAPSULE | Freq: Every day | ORAL | 0 refills | Status: DC

## 2018-11-01 MED ORDER — SPIRONOLACTONE 12.5 MG HALF TABLET
12.5000 mg | ORAL_TABLET | Freq: Every day | ORAL | Status: DC
  Administered 2018-11-01: 12.5 mg via ORAL
  Filled 2018-11-01: qty 1

## 2018-11-01 MED ORDER — POTASSIUM CHLORIDE CRYS ER 20 MEQ PO TBCR: 20.0000 meq | EXTENDED_RELEASE_TABLET | Freq: Two times a day (BID) | ORAL | 0 refills | Status: DC

## 2018-11-01 MED ORDER — AMIODARONE HCL 200 MG PO TABS: 200.0000 mg | ORAL_TABLET | Freq: Every day | ORAL | 0 refills | Status: DC

## 2018-11-01 NOTE — NC FL2 (Addendum)
[place] LEVEL OF CARE SCREENING TOOL     IDENTIFICATION  Patient Name: Nicole Barker Birthdate: 09/07/1930 Sex: female Admission Date (Current Location): 10/25/2018  [facility] and [place] Number:  Herbalist and Address:  [facility],  [place], [place]      Provider Number: [id number]  Attending Physician Name and Address:  Kerney Elbe, DO  Relative Name and Phone Number:  Daughter, Vickey Sages, [phone]    Current Level of Care: Hospital Recommended Level of Care: [place] Prior Approval Number:    Date Approved/Denied:   PASRR Number: [id number]  Discharge Plan: Other (Comment)(ALF)    Current Diagnoses: Patient Active Problem List   Diagnosis Date Noted  . Acute heart failure ([place]) 10/25/2018  . Atrial fibrillation with RVR ([place]) 10/25/2018  . Dementia without behavioral disturbance ([place]) 10/25/2018  . Essential hypertension 10/25/2018  . Hyperlipidemia 10/25/2018  . AKI (acute kidney injury) ([place]) 10/25/2018    Orientation RESPIRATION BLADDER Height & Weight     Self, Place  Normal Continent Weight: 154 lb 11.2 oz (70.2 kg) Height:  4\' 11"  (149.9 cm)  BEHAVIORAL SYMPTOMS/MOOD NEUROLOGICAL BOWEL NUTRITION STATUS      Continent Diet(Regular diet)  AMBULATORY STATUS COMMUNICATION OF NEEDS Skin   Limited Assist Verbally Normal                       Personal Care Assistance Level of Assistance  Bathing, Feeding, Dressing Bathing Assistance: Limited assistance Feeding assistance: Independent Dressing Assistance: Limited assistance     Functional Limitations Info  Sight, Hearing, Speech Sight Info: Adequate Hearing Info: Adequate Speech Info: Adequate    SPECIAL CARE FACTORS FREQUENCY  PT (By licensed PT), OT (By licensed OT)     PT Frequency: Minimum 3x weekly OT Frequency: Minimum 3x weekly            Contractures Contractures Info: Not present     Additional Factors Info  Code Status, Allergies Code Status Info: DNR Allergies Info: Iodine           Current Medications (11/01/2018):  This is the current hospital active medication list Current Facility-Administered Medications  Medication Dose Route Frequency Provider Last Rate Last Dose  . 0.9 %  sodium chloride infusion  250 mL Intravenous PRN Mariel Aloe, MD      . acetaminophen (TYLENOL) tablet 650 mg  650 mg Oral Q6H PRN Mariel Aloe, MD   650 mg at 10/31/18 1141  . [START ON 11/02/2018] amiodarone (PACERONE) tablet 200 mg  200 mg Oral Daily Dorothy Spark, MD      . apixaban Arne [place]) tablet 5 mg  5 mg Oral BID Mariel Aloe, MD   5 mg at 11/01/18 0949  . ceFAZolin (ANCEF) IVPB 1 g/50 mL premix  1 g Intravenous Q12H Raiford Noble [place], DO 100 mL/hr at 11/01/18 1115 1 g at 11/01/18 1115  . chlorhexidine (PERIDEX) 0.12 % solution 15 mL  15 mL Mouth Rinse BID Mariel Aloe, MD   15 mL at 11/01/18 0949  . diltiazem (CARDIZEM CD) 24 hr capsule 240 mg  240 mg Oral Daily Satira Sark, MD   240 mg at 11/01/18 0949  . fluticasone (FLONASE) 50 MCG/ACT nasal spray 1 spray  1 spray Each Nare Daily Raiford Noble [place], DO   1 spray at 11/01/18 0950  . furosemide (LASIX) tablet 40 mg  40 mg Oral Daily Phylliss Bob,  Janine, NP      . gabapentin (NEURONTIN) capsule 100 mg  100 mg Oral BID Mariel Aloe, MD   100 mg at 11/01/18 0949  . hydrALAZINE (APRESOLINE) injection 5 mg  5 mg Intravenous Q6H PRN Mariel Aloe, MD   5 mg at 10/25/18 1754  . losartan (COZAAR) tablet 25 mg  25 mg Oral Daily Daune Perch, NP   25 mg at 11/01/18 0949  . MEDLINE mouth rinse  15 mL Mouth Rinse q12n4p Mariel Aloe, MD   15 mL at 10/31/18 1200  . metoprolol tartrate (LOPRESSOR) tablet 25 mg  25 mg Oral BID Lelon Perla, MD   25 mg at 11/01/18 0949  . ondansetron (ZOFRAN) injection 4 mg  4 mg Intravenous Q6H PRN Mariel Aloe, MD      . polyethylene glycol (MIRALAX / GLYCOLAX)  packet 17 g  17 g Oral BID Raiford Noble [place], DO   17 g at 11/01/18 0949  . potassium chloride SA (K-DUR,KLOR-CON) CR tablet 20 mEq  20 mEq Oral BID Mariel Aloe, MD   20 mEq at 11/01/18 0949  . senna-docusate (Senokot-S) tablet 1 tablet  1 tablet Oral BID Raiford Noble [place], [place]   1 tablet at 11/01/18 7893  . sodium chloride flush (NS) 0.9 % injection 3 mL  3 mL Intravenous Q12H Mariel Aloe, MD   3 mL at 11/01/18 0951  . sodium chloride flush (NS) 0.9 % injection 3 mL  3 mL Intravenous PRN Mariel Aloe, MD      . spironolactone (ALDACTONE) tablet 12.5 mg  12.5 mg Oral Daily Dorothy Spark, MD   12.5 mg at 11/01/18 1115  . traMADol (ULTRAM) tablet 50 mg  50 mg Oral Q12H PRN Mariel Aloe, MD   50 mg at 10/31/18 1142     Discharge Medications: Medication List    STOP taking these medications   dronedarone 400 MG tablet Commonly known as:  MULTAQ     TAKE these medications   acetaminophen 325 MG tablet Commonly known as:  TYLENOL Take 2 tablets (650 mg total) by mouth every 6 (six) hours as needed for mild pain or headache. What changed:    medication strength  how much to take  when to take this  reasons to take this   amiodarone 200 MG tablet Commonly known as:  PACERONE Take 1 tablet (200 mg total) by mouth daily. Start taking on:  11/02/2018   CENTRUM SILVER 50+WOMEN Tabs Take 1 tablet by mouth daily.   diltiazem 240 MG 24 hr capsule Commonly known as:  CARDIZEM CD Take 1 capsule (240 mg total) by mouth daily. Start taking on:  11/02/2018   ELIQUIS 5 MG Tabs tablet Generic drug:  apixaban Take 5 mg by mouth 2 (two) times daily.   fluticasone 50 MCG/ACT nasal spray Commonly known as:  FLONASE Place 1 spray into both nostrils daily. Start taking on:  11/02/2018   furosemide 40 MG tablet Commonly known as:  LASIX Take 1 tablet (40 mg total) by mouth daily. Start taking on:  11/02/2018 What changed:    medication strength  how  much to take   gabapentin 100 MG capsule Commonly known as:  NEURONTIN Take 100 mg by mouth 2 (two) times daily.   losartan 25 MG tablet Commonly known as:  COZAAR Take 1 tablet (25 mg total) by mouth daily. Start taking on:  11/02/2018 What changed:    medication strength  how much to take   metoprolol tartrate 25 MG tablet Commonly known as:  LOPRESSOR Take 1 tablet (25 mg total) by mouth 2 (two) times daily.   polyethylene glycol packet Commonly known as:  MIRALAX / GLYCOLAX Take 17 g by mouth daily.   potassium chloride SA 20 MEQ tablet Commonly known as:  K-DUR,KLOR-CON Take 1 tablet (20 mEq total) by mouth 2 (two) times daily. What changed:    medication strength  how much to take  when to take this   senna-docusate 8.6-50 MG tablet Commonly known as:  Senokot-S Take 1 tablet by mouth at bedtime as needed for mild constipation.   spironolactone 25 MG tablet Commonly known as:  ALDACTONE Take 0.5 tablets (12.5 mg total) by mouth daily. Start taking on:  11/02/2018   traMADol 50 MG tablet Commonly known as:  ULTRAM Take 1 tablet (50 mg total) by mouth every 12 (twelve) hours as needed for moderate pain.        Relevant Imaging Results:  Relevant Lab Results:   Additional Information SSN: [id number]  Joellen [place], [place]

## 2018-11-01 NOTE — Plan of Care (Signed)
Pt tolerating ambulation well. Pt able to change positions in bed independently.

## 2018-11-01 NOTE — Progress Notes (Signed)
CSW following for discharge plan.

## 2018-11-01 NOTE — Progress Notes (Addendum)
CSW following patient for discharge needs.  [facility] aware of patient and patient's return. CSW to fax cosigned FL2 to [facility] at [phone] when available.  Patient's daughter will transport patient back to facility, as [facility] is unable to offer transportation that is not pre-scheduled.  Signed DNR is in discharge packet in patient chart.  RN Please call [phone] for report.    2:35pm Facility confirms receipt of signed FL2. Patient able to return to facility. Patient's nurse informed.   CSW signing off.   Stephanie Acre, [place] Social Worker [phone]

## 2018-11-01 NOTE — Discharge Summary (Signed)
Physician Discharge Summary  Nicole Barker QMG:[id number] DOB: 05/10/1930 DOA: 10/25/2018  PCP: Nicole San, MD  Admit date: 10/25/2018 Discharge date: 11/01/2018  Admitted From: ALF Disposition: ALF with Home Health PT  Recommendations for Outpatient Follow-up:  1. Follow up with PCP in 1-2 weeks 2. Follow up with Cardiology in the outpatient  3. Please obtain CMP/CBC, Mag, Phos in one week 4. Please follow up on the following pending results: Repeat CXR in 1-2 weeks  Home Health: Yes Equipment/Devices: None recommended by PT   Discharge Condition: Stable CODE STATUS: FULL CODE  Diet recommendation: Heart Healthy Diet with 1500 mL Fluid Restriction  Brief/Interim Summary: HPI per Dr. Cordelia Barker on 10/25/18 [id number] Nicole Barker a 84 y.o.femalewith medical history significant ofheart failure, atrial fibrillation, dementia, hypertension, hyperlipidemia.Patient unable to provide history. She is unsure of why she is here. She does state that she is having shortness of breath of unknown time frame. She also has some frequency.  ED Course: Vitals:Afebrile, tachycardia, tachypnea, hypertensive, on room air Labs:sodium of 132, creatinine of 1.01, alkaline phosphatase of 162, BNP of 1010, WBC of 12.7 Imaging:Chest x-ray significant for cardiomegaly, edema, bilateral effusion, atelectasis Medications/Course:Albuterol, lasix, lopressor, given  *Patient was diuresed and improved significantly.  She is weaned off of oxygen.,  Comfortable.  Cardiology adjusted her medication and place her on spironolactone, Cardizem, metoprolol 25 mg p.o. twice daily, as well as amiodarone.  She is deemed medically stable to be discharged and she will need follow-up with PCP as well as cardiology in outpatient setting as she is significantly improved.  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 No mediastinal or hilar masses. Bilateral lung base opacities consistent with a combination of small effusions and probable atelectasis. Remainder of the lungs is clear." -Continued with diuresis at IV 40 mg BID and changed to po 40 mg Lasix Dailyfor now and continue to follow Urine output and monitor Renal Fxn  -We will need home Ambulatory screen prior to discharge and did not desaturate so O2 was weaned and removed   Acute on Chronic combined systolic and diastolic CHF with an EF of 40 to 45% -On lasix, losartan and potassium as an outpatient. -BNP on Admission was 1,010.4  -Troponin negative x3 No chest pain.  -Weight on admission of 145 lbs -Lasix 40 mg IV BID changed to po; Written for  potassium supplementation at D/C -Strict in and out/daily weights -Watch kidney function while being diuresed -Transthoracic Echocardiogram as below -Oxygen as needed to keep O2 >94% but weaned off -Continue to Hold losartan until creatinine improves or appears stable -Patient is -4.806 L since admission; however question of weights are accurate and his weight has gone up to 161 and now coming down to 154 -IV Lasix 40 mg BID switched to  p.o. Today -Cardiology also resumed Home Losartan at 25 mg po  Daily and added Spironolactone 12.5 mg po Daily  -Continue to monitor volume status daily -Cardiology consulted for further evaluation recommendations and patient started on metoprolol 25 mg p.o. twice daily we will continue -Fluid Restrict Patient to 1500 mL -Follow up with Cardiology in the outpatient setting   Atrial fibrillation with RVR -Improved and is now Rate Controlled. Possibly contributed to acute heart failure -Stopped Dronedarone as contraindicated in patient with CHF but will continue with apixaban for anticoagulation -Continue with telemetry when moved out of SDU -Cardiology evaluated and added Metoprolol 25 mg p.o. twice daily -Off of Cardizem gtt and Cardiology change Cardizem 60 mg q6h -> Cardizem CD 240 mg po Daily and will continue at D/C -Per cardiology if she is symptomatic despite being rate controlled will need to consider adding amiodarone and proceeding with cardioversion as well as continue with apixaban for anticoagulation -We will need to obtain outside records to see if she has been in sinus recently and she has may be reasonable to proceed with cardioversion however this will need to be verified  -CHA2DS2-VASc is elevated at 5 -Cardiology feels as if A Fib with RVR is contributing to Heart Failure so they have added Amiodarone 400 mg po BID but now Cardiology changed to 200 mg po Daily  -C/w Metoprolol 25 mg po BID -Per cardiology if the CHF does not improve her heart rate is difficult control we will need to pursue a TEE guided cardioversion early next week but not entirely clear this will need to be pursed as her HR is better controlled and she is clinically improved  -Follow up with Cardiology in the outpatient setting   Acute kidney injury but suspect Chronic Kidney Disease Stage III, improved and stable -No baseline. Likely in setting of heart failure. CrCl of 29.1 -C/w IV Lasix; Dose was reduced to IV 40 mg Daily but will increase back to BID dosing given  worsened dyspnea yesterday; Will continue today  -Adjust medications for decreased function -BUN/Cr went from 12/1.01 -> 19/1.03 -> 25/1.20 -> 25/1.05 -> 24/1.10 -> 26/1.03 -> 24/1.00 -> 27/1.06 -Continue to Monitor and Trend Renal Function -Repeat CMP as an outpatient   Urinary frequency with E Coli UTI -Urinalysis showed clear urine small hemoglobin, small leukocytes, positive nitrites, many bacteria, 0-5 WBCs -Urine culture showed greater than 100,000 colonies gram-negative rods and it was E Coli that was Pan-sensitive -Changed empiric IV Ceftriaxone to Cefazolin to Narrow Treatment and will continued IV Cefazolin to complete 7 day course    Dementia -Does not appear to be on medications. Currently resides at [facility] but states she lives with her daughter. -Has periods of confusion -Delirium Precautions -Social work consulted and she will go back to ALF with PT  Essential Hypertension -Uncontrolled in setting of fluid overload. -Held Losartan but Cardiology adjusted and started back at 25 mg po Daily -Cardiology has added Metoprolol 25 mg p.o. twice daily -Lasix as above and will place on 40 mg po Daily  -Cardiology Starting patient on po Cardizem CD 240 mg Daily   Hyponatremia -In setting of volume overload -Continue with diuresis and sodium is now 132 -Continue monitor and repeat CMP in a.m.  Valvular Heart Disease -Seen on ECHO -Cardiology following and recommending conservative measures -Follow up with Cardiology as an outpatient    Leukocytosis, improved -Patient is Afebrile but WBC had gone from 12.6 -> 13.5 -> 10.5 -> 12.3 ->  11.8 -> 8.3 -> 9.0 ; Continues to Flutate -? Hemoconcentration in the setting of Lasix Use -Not on Any steroids -Urine Cx + for E Coli and was treated  -No Blood Cx's done but will obtain if spikes a temperature -CXR not indicative of PNA -Continue to Monitor for S/Sx of Infection -Repeat CBC as an outpatient    Obesity -Estimated body mass index is 31.25 kg/m as calculated from the following:   Height as of this encounter: 4' 11"  (1.499 m).   Weight as of this encounter: 70.2 kg. -Weight Loss Counseling given  Discharge Instructions  Discharge Instructions    (HEART FAILURE PATIENTS) Call MD:  Anytime you have any of the following symptoms: 1) 3 pound weight gain in 24 hours or 5 pounds in 1 week 2) shortness of breath, with or without a dry hacking cough 3) swelling in the hands, feet or stomach 4) if you have to sleep on extra pillows at night in order to breathe.   Complete by:  As directed    Call MD for:  difficulty breathing, headache or visual disturbances   Complete by:  As directed    Call MD for:  extreme fatigue   Complete by:  As directed    Call MD for:  hives   Complete by:  As directed    Call MD for:  persistant dizziness or light-headedness   Complete by:  As directed    Call MD for:  persistant nausea and vomiting   Complete by:  As directed    Call MD for:  redness, tenderness, or signs of infection (pain, swelling, redness, odor or green/yellow discharge around incision site)   Complete by:  As directed    Call MD for:  severe uncontrolled pain   Complete by:  As directed    Call MD for:  temperature >100.4   Complete by:  As directed    Diet - low sodium heart healthy   Complete by:  As directed    Discharge instructions   Complete by:  As directed    You were cared for by a hospitalist during your hospital stay. If you have any questions about your discharge medications or the care you received while you were in the hospital after you are discharged, you can call the unit and ask to speak with the hospitalist on call if the hospitalist that took care of you is not available. Once you are discharged, your primary care physician will handle any further medical issues. Please note that NO REFILLS for any discharge medications will be authorized once you are  discharged, as it is imperative that you return to your primary care physician (or establish a relationship with a primary care physician if you do not have one) for your aftercare needs so that they can reassess your need for medications and monitor your lab values.  Follow up with PCP and Cardiology as an outpatient. Take all medications as prescribed. If symptoms change or worsen please return to the ED for evaluation   Increase activity slowly   Complete by:  As directed      Allergies as of 11/01/2018      Reactions   Iodine Other (See Comments)   Unknown      Medication List    STOP taking these medications   dronedarone 400 MG tablet Commonly known as:  MULTAQ     TAKE these medications   acetaminophen 325 MG tablet Commonly known as:  TYLENOL  Take 2 tablets (650 mg total) by mouth every 6 (six) hours as needed for mild pain or headache. What changed:    medication strength  how much to take  when to take this  reasons to take this   amiodarone 200 MG tablet Commonly known as:  PACERONE Take 1 tablet (200 mg total) by mouth daily. Start taking on:  11/02/2018   CENTRUM SILVER 50+WOMEN Tabs Take 1 tablet by mouth daily.   diltiazem 240 MG 24 hr capsule Commonly known as:  CARDIZEM CD Take 1 capsule (240 mg total) by mouth daily. Start taking on:  11/02/2018   ELIQUIS 5 MG Tabs tablet Generic drug:  apixaban Take 5 mg by mouth 2 (two) times daily.   fluticasone 50 MCG/ACT nasal spray Commonly known as:  FLONASE Place 1 spray into both nostrils daily. Start taking on:  11/02/2018   furosemide 40 MG tablet Commonly known as:  LASIX Take 1 tablet (40 mg total) by mouth daily. Start taking on:  11/02/2018 What changed:    medication strength  how much to take   gabapentin 100 MG capsule Commonly known as:  NEURONTIN Take 100 mg by mouth 2 (two) times daily.   losartan 25 MG tablet Commonly known as:  COZAAR Take 1 tablet (25 mg total) by mouth  daily. Start taking on:  11/02/2018 What changed:    medication strength  how much to take   metoprolol tartrate 25 MG tablet Commonly known as:  LOPRESSOR Take 1 tablet (25 mg total) by mouth 2 (two) times daily.   polyethylene glycol packet Commonly known as:  MIRALAX / GLYCOLAX Take 17 g by mouth daily.   potassium chloride SA 20 MEQ tablet Commonly known as:  K-DUR,KLOR-CON Take 1 tablet (20 mEq total) by mouth 2 (two) times daily. What changed:    medication strength  how much to take  when to take this   senna-docusate 8.6-50 MG tablet Commonly known as:  Senokot-S Take 1 tablet by mouth at bedtime as needed for mild constipation.   spironolactone 25 MG tablet Commonly known as:  ALDACTONE Take 0.5 tablets (12.5 mg total) by mouth daily. Start taking on:  11/02/2018   traMADol 50 MG tablet Commonly known as:  ULTRAM Take 1 tablet (50 mg total) by mouth every 12 (twelve) hours as needed for moderate pain.      Follow-up Information    Erlene Quan, PA-C Follow up.   Specialties:  Cardiology, Radiology Why:  Cardiology hospital follow up on 11/22/18 at 2:30 pm. Please arrive 15 minutes early for check in.  Will also have labs checked on that day. No need for fasting.  Contact information: [address] [place] [address] [phone]          Allergies  Allergen Reactions  . Iodine Other (See Comments)    Unknown    Consultations:  Cardiology  Procedures/Studies: Dg Chest 2 View  Result Date: 10/25/2018 CLINICAL DATA:  Shortness of breath. EXAM: CHEST - 2 VIEW COMPARISON:  None. FINDINGS: The heart is enlarged. Aortic atherosclerosis is present. A mild interstitial pattern present. Some of this is likely chronic. Small effusions are present. Bibasilar airspace disease likely reflects atelectasis. IMPRESSION: 1. Cardiomegaly with mild edema and bilateral effusions suggesting congestive heart failure. 2. Bibasilar airspace  disease likely reflects atelectasis, left greater than right. 3. Aortic atherosclerosis. Electronically Signed   By: Barker Morelle M.D.   On: 10/25/2018 09:27   Dg Chest  Port 1 View  Result Date: 11/01/2018 CLINICAL DATA:  Shortness of breath EXAM: PORTABLE CHEST 1 VIEW COMPARISON:  10/30/2018 FINDINGS: Cardiac shadow remains enlarged. Aortic calcifications are again seen. Small effusions are again identified with some minimal basilar atelectasis. No significant central pulmonary edema is noted. Old rib fractures are noted on the right. IMPRESSION: Mild bibasilar atelectasis and effusions stable from the previous exam. Electronically Signed   By: Inez Catalina M.D.   On: 11/01/2018 08:05   Dg Chest Port 1 View  Result Date: 10/30/2018 CLINICAL DATA:  Mckena Chern is a 84 y.o. female with medical history significant of heart failure, atrial fibrillation, dementia, hypertension, hyperlipidemia. Patient unable to provide history. She is unsure of why she is here. She does state that she is having shortness of breath of unknown time frame EXAM: PORTABLE CHEST 1 VIEW COMPARISON:  10/29/2018 FINDINGS: Cardiac silhouette is mildly enlarged. No mediastinal or hilar masses. Bilateral lung base opacities consistent with a combination of small effusions and probable atelectasis. Remainder of the lungs is clear. No pneumothorax. IMPRESSION: 1. Pulmonary edema essentially resolved. 2. Residual small pleural effusions with basilar atelectasis. Electronically Signed   By: Lajean Manes M.D.   On: 10/30/2018 07:15   Dg Chest Port 1 View  Result Date: 10/29/2018 CLINICAL DATA:  Shortness of breath EXAM: PORTABLE CHEST 1 VIEW COMPARISON:  10/28/2018 FINDINGS: Enlarged cardiac silhouette as seen yesterday. Aortic atherosclerosis. There is radiographic improvement with less interstitial and alveolar edema and better aeration of the lower lungs. No worsening or new finding. IMPRESSION: Radiographic improvement  with less edema. Electronically Signed   By: Nelson Chimes M.D.   On: 10/29/2018 06:49   Dg Chest Port 1 View  Result Date: 10/28/2018 CLINICAL DATA:  Shortness of breath EXAM: PORTABLE CHEST 1 VIEW COMPARISON:  10/25/2018 FINDINGS: Cardiomegaly. Aortic atherosclerosis. Worsening of edema pattern. Developing effusions with lower lobe atelectasis. Upper lungs are otherwise well aerated. IMPRESSION: Congestive heart failure. Cardiomegaly and aortic atherosclerosis. Worsening of edema pattern. Accumulating effusions with lower lobe atelectasis. Electronically Signed   By: Nelson Chimes M.D.   On: 10/28/2018 06:26    ECHOCARDIOGRAM 10/25/18 ------------------------------------------------------------------- Study Conclusions  - Left ventricle: The cavity size was normal. Wall thickness was increased in a pattern of mild LVH. Systolic function was mildly reduced. The estimated ejection fraction was in the range of 45% to 50%. Diffuse hypokinesis. Doppler parameters are consistent with high ventricular filling pressure. - Aortic valve: Valve mobility was restricted. There was mild stenosis. There was mild regurgitation. - Mitral valve: Calcified annulus. The findings are consistent with mild stenosis. There was moderate regurgitation. - Left atrium: The atrium was mildly dilated. - Tricuspid valve: There was moderate regurgitation. - Pulmonary arteries: Systolic pressure was moderately to severely increased. PA peak pressure: 65 mm Hg (S). - Pericardium, extracardiac: There was a left pleural effusion.  Impressions:  - Mild global reduction in LV systolic function; mild LVH; calcified aortic valve with mild AS (mean gradient 15 mmHg) and mild AI; MAC with mild MS (mean gradient 5 mmHg); moderate MR; mild LAE; moderate TR; moderate to severe pulmonary hypertension  Subjective: Examined at bedside and was doing well.  Denied any chest pain, lightheadedness or dizziness.   Thinks her leg swelling is improved.  Not as short of breath and not wearing oxygen today.  Ready to go back to ALF.  No other concerns or complaints at this time.  Discharge Exam: Vitals:   10/31/18 2151 11/01/18 0451  BP: Marland Kitchen)  159/93 (!) 151/73  Pulse: 74 (!) 59  Resp: 20 18  Temp: 97.7 F (36.5 C) (!) 97.4 F (36.3 C)  SpO2: 96% 95%   Vitals:   10/31/18 1250 10/31/18 1427 10/31/18 2151 11/01/18 0451  BP:  106/81 (!) 159/93 (!) 151/73  Pulse:  (!) 53 74 (!) 59  Resp:  20 20 18   Temp:   97.7 F (36.5 C) (!) 97.4 F (36.3 C)  TempSrc:   Oral Oral  SpO2: 97% 98% 96% 95%  Weight:    70.2 kg  Height:       General: Pt is alert, awake, not in acute distress Cardiovascular: Irregularly Irregular, S1/S2 +, no rubs, no gallops Respiratory: Diminished bilaterally, no wheezing, no rhonchi Abdominal: Soft, NT, ND, bowel sounds + Extremities: Trace edema, no cyanosis  The results of significant diagnostics from this hospitalization (including imaging, microbiology, ancillary and laboratory) are listed below for reference.    Microbiology: Recent Results (from the past 240 hour(s))  Culture, Urine     Status: Abnormal   Collection Time: 10/25/18 12:08 PM  Result Value Ref Range Status   Specimen Description   Final    URINE, RANDOM Performed at [facility] [address], [place], [place] [address]    Special Requests   Final    NONE Performed at [facility], [place] [address] [place], [place] [address]    Culture >=100,000 COLONIES/mL ESCHERICHIA COLI (A)  Final   Report Status 10/27/2018 FINAL  Final   Organism ID, Bacteria ESCHERICHIA COLI (A)  Final      Susceptibility   Escherichia coli - MIC*    AMPICILLIN 8 SENSITIVE Sensitive     CEFAZOLIN <=4 SENSITIVE Sensitive     CEFTRIAXONE <=1 SENSITIVE Sensitive     CIPROFLOXACIN <=0.25 SENSITIVE Sensitive     GENTAMICIN <=1 SENSITIVE Sensitive     IMIPENEM <=0.25 SENSITIVE Sensitive      NITROFURANTOIN <=16 SENSITIVE Sensitive     TRIMETH/SULFA <=20 SENSITIVE Sensitive     AMPICILLIN/SULBACTAM <=2 SENSITIVE Sensitive     PIP/TAZO <=4 SENSITIVE Sensitive     Extended ESBL NEGATIVE Sensitive     * >=100,000 COLONIES/mL ESCHERICHIA COLI  MRSA PCR Screening     Status: None   Collection Time: 10/25/18 12:58 PM  Result Value Ref Range Status   MRSA by PCR NEGATIVE NEGATIVE Final    Comment:        The GeneXpert MRSA Assay (FDA approved for NASAL specimens only), is one component of a comprehensive MRSA colonization surveillance program. It is not intended to diagnose MRSA infection nor to guide or monitor treatment for MRSA infections. Performed at [facility], [place] [address], [place], [place] [address]     Labs: BNP (last 3 results) Recent Labs    10/25/18 0943  BNP 8,850.2*   Basic Metabolic Panel: Recent Labs  Lab 10/28/18 0337 10/29/18 0322 10/30/18 0447 10/31/18 0504 11/01/18 0511  NA 131* 131* 132* 133* 132*  K 4.8 4.5 4.5 4.3 4.8  CL 96* 93* 92* 97* 95*  CO2 24 27 30 27 29   GLUCOSE 110* 108* 105* 90 98  BUN 25* 24* 26* 24* 27*  CREATININE 1.05* 1.10* 1.03* 1.00 1.06*  CALCIUM 8.7* 9.0 9.0 8.9 9.4  MG 1.8 1.8 1.8 1.8 2.0  PHOS 3.9 3.9 3.4 3.4 3.3   Liver Function Tests: Recent Labs  Lab 10/28/18 7741 10/29/18 0322 10/30/18 0447 10/31/18 0504 11/01/18 2878  AST 34 36 32 28 27  ALT 24 28 21 19 17   ALKPHOS 148* 169* 158* 143* 145*  BILITOT 0.7 0.9 0.7 0.8 0.6  PROT 6.0* 6.2* 6.3* 5.7* 6.1*  ALBUMIN 3.0* 3.2* 3.2* 3.0* 3.4*   No results for input(s): LIPASE, AMYLASE in the last 168 hours. No results for input(s): AMMONIA in the last 168 hours. CBC: Recent Labs  Lab 10/28/18 0337 10/29/18 0322 10/30/18 0447 10/31/18 0504 11/01/18 0511  WBC 10.4 12.3* 11.8* 8.3 9.0  NEUTROABS 7.8* 9.0* 8.9* 5.5 6.4  HGB 11.8* 11.9* 12.4 12.2 11.6*  HCT 37.6 38.0 39.6 38.2 37.5  MCV 92.6 92.7 92.7 90.3 93.1  PLT 327 365  358 299 306   Cardiac Enzymes: Recent Labs  Lab 10/25/18 1729 10/25/18 2307 10/26/18 0518  TROPONINI <0.03 <0.03 <0.03   BNP: Invalid input(s): POCBNP CBG: No results for input(s): GLUCAP in the last 168 hours. D-Dimer No results for input(s): DDIMER in the last 72 hours. Hgb A1c No results for input(s): HGBA1C in the last 72 hours. Lipid Profile No results for input(s): CHOL, HDL, LDLCALC, TRIG, CHOLHDL, LDLDIRECT in the last 72 hours. Thyroid function studies No results for input(s): TSH, T4TOTAL, T3FREE, THYROIDAB in the last 72 hours.  Invalid input(s): FREET3 Anemia work up No results for input(s): VITAMINB12, FOLATE, FERRITIN, TIBC, IRON, RETICCTPCT in the last 72 hours. Urinalysis    Component Value Date/Time   COLORURINE YELLOW 10/25/2018 [place] 10/25/2018 1208   LABSPEC 1.009 10/25/2018 1208   PHURINE 5.0 10/25/2018 1208   GLUCOSEU NEGATIVE 10/25/2018 1208   HGBUR SMALL (A) 10/25/2018 1208   BILIRUBINUR NEGATIVE 10/25/2018 1208   KETONESUR NEGATIVE 10/25/2018 1208   PROTEINUR NEGATIVE 10/25/2018 1208   NITRITE POSITIVE (A) 10/25/2018 1208   LEUKOCYTESUR SMALL (A) 10/25/2018 1208   Sepsis Labs Invalid input(s): PROCALCITONIN,  WBC,  LACTICIDVEN Microbiology Recent Results (from the past 240 hour(s))  Culture, Urine     Status: Abnormal   Collection Time: 10/25/18 12:08 PM  Result Value Ref Range Status   Specimen Description   Final    URINE, RANDOM Performed at [facility], [place] [address], [place], [place] [address]    Special Requests   Final    NONE Performed at [facility], [place] [address], [place], [place] [address]    Culture >=100,000 COLONIES/mL ESCHERICHIA COLI (A)  Final   Report Status 10/27/2018 FINAL  Final   Organism ID, Bacteria ESCHERICHIA COLI (A)  Final      Susceptibility   Escherichia coli - MIC*    AMPICILLIN 8 SENSITIVE Sensitive     CEFAZOLIN <=4 SENSITIVE Sensitive      CEFTRIAXONE <=1 SENSITIVE Sensitive     CIPROFLOXACIN <=0.25 SENSITIVE Sensitive     GENTAMICIN <=1 SENSITIVE Sensitive     IMIPENEM <=0.25 SENSITIVE Sensitive     NITROFURANTOIN <=16 SENSITIVE Sensitive     TRIMETH/SULFA <=20 SENSITIVE Sensitive     AMPICILLIN/SULBACTAM <=2 SENSITIVE Sensitive     PIP/TAZO <=4 SENSITIVE Sensitive     Extended ESBL NEGATIVE Sensitive     * >=100,000 COLONIES/mL ESCHERICHIA COLI  MRSA PCR Screening     Status: None   Collection Time: 10/25/18 12:58 PM  Result Value Ref Range Status   MRSA by PCR NEGATIVE NEGATIVE Final    Comment:        The GeneXpert MRSA Assay (FDA approved for NASAL specimens only), is one component of a comprehensive MRSA  colonization surveillance program. It is not intended to diagnose MRSA infection nor to guide or monitor treatment for MRSA infections. Performed at [facility], [place] [address], [place], [place] [address]    Time coordinating discharge: 35 minutes  SIGNED:  Kerney Elbe, DO [facility] 11/01/2018, 11:49 AM Pager is on [place]  If 7PM-7AM, please contact night-coverage [email or web address] Password [email or web address]

## 2018-11-01 NOTE — Progress Notes (Addendum)
Progress Note  Patient Name: Nicole Barker Date of Encounter: 11/01/2018  Primary Cardiologist: Kirk Ruths, MD   Subjective   The patient is resting comfortably in bed.  She states that she is feeling much better and breathing better.  She feels like she is ready to go home.  Inpatient Medications    Scheduled Meds: . amiodarone  400 mg Oral BID  . apixaban  5 mg Oral BID  . chlorhexidine  15 mL Mouth Rinse BID  . diltiazem  240 mg Oral Daily  . fluticasone  1 spray Each Nare Daily  . furosemide  40 mg Intravenous BID  . gabapentin  100 mg Oral BID  . mouth rinse  15 mL Mouth Rinse q12n4p  . metoprolol tartrate  25 mg Oral BID  . polyethylene glycol  17 g Oral BID  . potassium chloride  20 mEq Oral BID  . senna-docusate  1 tablet Oral BID  . sodium chloride flush  3 mL Intravenous Q12H   Continuous Infusions: . sodium chloride    .  ceFAZolin (ANCEF) IV 1 g (10/31/18 2335)   PRN Meds: sodium chloride, acetaminophen, hydrALAZINE, ondansetron (ZOFRAN) IV, sodium chloride flush, traMADol   Vital Signs    Vitals:   10/31/18 1250 10/31/18 1427 10/31/18 2151 11/01/18 0451  BP:  106/81 (!) 159/93 (!) 151/73  Pulse:  (!) 53 74 (!) 59  Resp:  _0 Temp:   97.7 F (36.5 C) (!) 97.4 F (36.3 C)  TempSrc:   Oral Oral  SpO2: 97% 98% 96% 95%  Weight:    70.2 kg  Height:        Intake/Output Summary (Last 24 hours) at 11/01/2018 0806 Last data filed at 10/31/2018 2200 Gross per 24 hour  Intake 415 ml  Output -  Net 415 ml   Filed Weights   10/30/18 0444 10/31/18 0454 11/01/18 0451  Weight: 69.9 kg 69.8 kg 70.2 kg    Telemetry    Atrial fibrillation in the 60s with occasional PVCs- Personally Reviewed  ECG    No new tracings- Personally Reviewed  Physical Exam   GEN: No acute distress.   Neck: No JVD Cardiac:  Irregularly irregular rhythm, 2/6 systolic murmur LUSB Respiratory: Clear to auscultation bilaterally. GI: Soft, nontender,  non-distended  MS: No edema; No deformity. Neuro:  Nonfocal  Psych: Normal affect   Labs    Chemistry Recent Labs  Lab 10/30/18 0447 10/31/18 0504 11/01/18 0511  NA 132* 133* 132*  K 4.5 4.3 4.8  CL 92* 97* 95*  CO2 _1 GLUCOSE 105* 90 98  BUN 26* 24* 27*  CREATININE 1.03* 1.00 1.06*  CALCIUM 9.0 8.9 9.4  PROT 6.3* 5.7* 6.1*  ALBUMIN 3.2* 3.0* 3.4*  AST 32 28 27  ALT _2 ALKPHOS 158* 143* 145*  BILITOT 0.7 0.8 0.6  GFRNONAA 48* 50* 46*  GFRAA 55* 57* 54*  ANIONGAP _3 Hematology Recent Labs  Lab 10/30/18 0447 10/31/18 0504 11/01/18 0511  WBC 11.8* 8.3 9.0  RBC 4.27 4.23 4.03  HGB 12.4 12.2 11.6*  HCT 39.6 38.2 37.5  MCV 92.7 90.3 93.1  MCH 29.0 28.8 28.8  MCHC 31.3 31.9 30.9  RDW 16.3* 16.1* 15.9*  PLT 358 299 306    Cardiac Enzymes Recent Labs  Lab 10/25/18 1729 10/25/18 2307 10/26/18 0518  TROPONINI <0.03 <0.03 <0.03    Recent Labs  Lab 10/25/18 0949  TROPIPOC 0.00     BNP Recent Labs  Lab 10/25/18 0943  BNP 1,010.4*     DDimer No results for input(s): DDIMER in the last 168 hours.   Radiology    No results found.  Cardiac Studies   Echocardiogram 10/25/2018: Study Conclusions - Left ventricle: The cavity size was normal. Wall thickness was increased in a pattern of mild LVH. Systolic function was mildly reduced. The estimated ejection fraction was in the range of 45% to 50%. Diffuse hypokinesis. Doppler parameters are consistent with high ventricular filling pressure. - Aortic valve: Valve mobility was restricted. There was mild stenosis. There was mild regurgitation. - Mitral valve: Calcified annulus. The findings are consistent with mild stenosis. There was moderate regurgitation. - Left atrium: The atrium was mildly dilated. - Tricuspid valve: There was moderate regurgitation. - Pulmonary arteries: Systolic pressure was moderately to severely increased. PA peak pressure: 65 mm Hg (S). -  Pericardium, extracardiac: There was a left pleural effusion.  Impressions: - Mild global reduction in LV systolic function; mild LVH; calcified aortic valve with mild AS (mean gradient 15 mmHg) and mild AI; MAC with mild MS (mean gradient 5 mmHg); moderate MR; mild LAE; moderate TR; moderate to severe pulmonary hypertension.  Patient Profile     84 y.o. female now at [facility] for dementia,with a reportedhx of heart failure, atrial fibrillation, chronic anticoagulation, HTN, HLD, dementia and asthmawho is beingfollowedfor the evaluation ofacute combined systolic and diastolic heart failure and atrial fibrillationw/ RVR.  Assessment & Plan    Persistent atrial fibrillation -Previously treated with Multaq which was discontinued on 46/9 due to systolic heart failure. -Heart rates were difficult to control on metoprolol and Cardizem so amiodarone was added on Friday. -Remains in atrial fibrillation with good rate control in the 60's -On Eliquis for stroke risk reduction -Patient is feeling much better.  She is likely ready for discharge today.  I will arrange for follow-up in our office.  Acute on chronic combined systolic and diastolic CHF -Likely exacerbated by atrial fibrillation with RVR.  BNP was 1000 presentation -Improved -Converting to oral lasix today.  -Ready for discharge today on lasix 40 mg up from home 20 mg  Cardiomyopathy -Reported in past medical history.  Echo this admission showed mildly reduced LVEF at 45-50% -Beta-blocker was added.  Her home ARB was initially held due to renal function, which is now back to normal -With improved renal function and blood pressure normal to mildly elevated, recommend resuming her home ARB at a reduced dose considering the addition of BB, CCB and amiodarone  AKI -Renal function now improved with serum creatinine 1.06    For questions or updates, please contact [facility] Please consult [email or web address] for  contact info under    Signed, Daune Perch, NP  11/01/2018, 8:06 AM    The patient was seen, examined and discussed with Daune Perch, NP-C and I agree with the above.   The patient feels significantly better and is ready to go home. Appears euvolemic. Remains hypertensive.  I would switch iv lasix to PO 40 mg daily and add spironolactone 12.5 mg po daily. Creatinine has improved and is now normal. Telemetry shows atrial fibrillation that is rate controlled.  I will change amiodarone to 200 mg PO daily.   [facility] will sign off.   Medication Recommendations:  As above Other recommendations (labs, testing, etc):  No further testing Follow up as an outpatient:  We will arrange prior to discharge, labs will be  needed to recheck K and Crea at the next visit.   Ena Dawley, MD 11/01/2018

## 2018-11-02 ENCOUNTER — Emergency Department (HOSPITAL_COMMUNITY): Payer: Medicare Other

## 2018-11-02 ENCOUNTER — Emergency Department (HOSPITAL_COMMUNITY)
Admission: EM | Admit: 2018-11-02 | Discharge: 2018-11-02 | Disposition: A | Discharge status: Home / Self Care | Payer: Medicare Other | Attending: Emergency Medicine | Admitting: Emergency Medicine

## 2018-11-02 ENCOUNTER — Encounter (HOSPITAL_COMMUNITY): Payer: Self-pay | Admitting: Emergency Medicine

## 2018-11-02 DIAGNOSIS — Z79899 Other long term (current) drug therapy: Secondary | ICD-10-CM | POA: Diagnosis not present

## 2018-11-02 DIAGNOSIS — F0390 Unspecified dementia without behavioral disturbance: Secondary | ICD-10-CM | POA: Insufficient documentation

## 2018-11-02 DIAGNOSIS — R0602 Shortness of breath: Secondary | ICD-10-CM | POA: Diagnosis present

## 2018-11-02 DIAGNOSIS — J811 Chronic pulmonary edema: Secondary | ICD-10-CM | POA: Diagnosis not present

## 2018-11-02 DIAGNOSIS — E042 Nontoxic multinodular goiter: Secondary | ICD-10-CM | POA: Diagnosis not present

## 2018-11-02 DIAGNOSIS — I10 Essential (primary) hypertension: Secondary | ICD-10-CM | POA: Diagnosis not present

## 2018-11-02 LAB — D-DIMER, QUANTITATIVE: D-Dimer, Quant: 2.26 ug/mL-FEU — ABNORMAL HIGH (ref 0.00–0.50)

## 2018-11-02 LAB — BASIC METABOLIC PANEL
Anion gap: 9 (ref 5–15)
BUN: 22 mg/dL (ref 8–23)
CALCIUM: 9.2 mg/dL (ref 8.9–10.3)
CHLORIDE: 96 mmol/L — AB (ref 98–111)
CO2: 28 mmol/L (ref 22–32)
CREATININE: 1.15 mg/dL — AB (ref 0.44–1.00)
GFR calc Af Amer: 48 mL/min — ABNORMAL LOW (ref >60)
GFR calc non Af Amer: 42 mL/min — ABNORMAL LOW (ref >60)
Glucose, Bld: 104 mg/dL — ABNORMAL HIGH (ref 70–99)
POTASSIUM: 4.7 mmol/L (ref 3.5–5.1)
Sodium: 133 mmol/L — ABNORMAL LOW (ref 135–145)

## 2018-11-02 LAB — I-STAT TROPONIN, ED: Troponin i, poc: 0 ng/mL (ref 0.00–0.08)

## 2018-11-02 LAB — CBC WITH DIFFERENTIAL/PLATELET
Abs Immature Granulocytes: 0.04 10*3/uL (ref 0.00–0.07)
Basophils Absolute: 0.1 10*3/uL (ref 0.0–0.1)
Basophils Relative: 1 %
EOS PCT: 3 %
Eosinophils Absolute: 0.3 10*3/uL (ref 0.0–0.5)
HEMATOCRIT: 38.9 % (ref 36.0–46.0)
Hemoglobin: 11.9 g/dL — ABNORMAL LOW (ref 12.0–15.0)
Immature Granulocytes: 0 %
LYMPHS ABS: 1.3 10*3/uL (ref 0.7–4.0)
Lymphocytes Relative: 12 %
MCH: 27.9 pg (ref 26.0–34.0)
MCHC: 30.6 g/dL (ref 30.0–36.0)
MCV: 91.1 fL (ref 80.0–100.0)
MONO ABS: 1.2 10*3/uL — AB (ref 0.1–1.0)
MONOS PCT: 11 %
Neutro Abs: 8.1 10*3/uL — ABNORMAL HIGH (ref 1.7–7.7)
Neutrophils Relative %: 73 %
Platelets: 353 10*3/uL (ref 150–400)
RBC: 4.27 MIL/uL (ref 3.87–5.11)
RDW: 15.8 % — AB (ref 11.5–15.5)
WBC: 10.9 10*3/uL — ABNORMAL HIGH (ref 4.0–10.5)
nRBC: 0 % (ref 0.0–0.2)

## 2018-11-02 LAB — BRAIN NATRIURETIC PEPTIDE: B Natriuretic Peptide: 654 pg/mL — ABNORMAL HIGH (ref 0.0–100.0)

## 2018-11-02 MED ORDER — IOPAMIDOL (ISOVUE-370) INJECTION 76%
80.0000 mL | Freq: Once | INTRAVENOUS | Status: AC | PRN
  Administered 2018-11-02: 42 mL via INTRAVENOUS

## 2018-11-02 MED ORDER — IOPAMIDOL (ISOVUE-370) INJECTION 76%
INTRAVENOUS | Status: AC
  Filled 2018-11-02: qty 100

## 2018-11-02 NOTE — ED Triage Notes (Signed)
Pt presents from [facility] for sob worsening since she moved here from [place]; pt with hx of Sjogren's syndrome; dry cough noted; denies pain

## 2018-11-02 NOTE — ED Provider Notes (Signed)
[facility] EMERGENCY DEPARTMENT Provider Note   CSN: [id number] Arrival date & time: 11/02/18  1553     History   Chief Complaint Chief Complaint  Patient presents with  . Shortness of Breath  . Cough    HPI Nicole Barker is a 84 y.o. female with a past medical history of heart failure, dementia, hypertension and A. fib.  She presents with cc of sob. There is a level 5 caveat due to dementia. Patient is not a reliable historian. She states that she awoke from sleep at the ALF today gasping for air. She was admitted for 7 days due to  Acute respiratory failure secondary to decompensated combined heart failure.  She was hypoxic upon admission.  Patient was also noted to have acute kidney injury in the setting of chronic kidney disease, urinary tract infection positive for E. coli, hypervolemic hyponatremia, and valvular disease noted on echocardiogram.  Patient does complain of a dry cough.  She had multiple medication changes which can be reviewed in her discharge summary yesterday at 11:49 AM.  HPI  Past Medical History:  Diagnosis Date  . Anxiety   . Atrial fibrillation ([place])   . Dementia ([place])   . Hyperlipidemia   . Hypertension     Patient Active Problem List   Diagnosis Date Noted  . Acute heart failure ([place]) 10/25/2018  . Atrial fibrillation with RVR ([place]) 10/25/2018  . Dementia without behavioral disturbance ([place]) 10/25/2018  . Essential hypertension 10/25/2018  . Hyperlipidemia 10/25/2018  . AKI (acute kidney injury) ([place]) 10/25/2018    Past Surgical History:  Procedure Laterality Date  . ABDOMINAL HYSTERECTOMY    . BACK SURGERY    . JOINT REPLACEMENT     knee     OB History   None      Home Medications    Prior to Admission medications   Medication Sig Start Date End Date Taking? Authorizing Provider  acetaminophen (TYLENOL) 325 MG tablet Take 2 tablets (650 mg total) by mouth every 6 (six) hours as needed for mild pain or  headache. 11/01/18   Raiford Noble Latif, DO  amiodarone (PACERONE) 200 MG tablet Take 1 tablet (200 mg total) by mouth daily. 11/02/18   Raiford Noble Latif, DO  apixaban (ELIQUIS) 5 MG TABS tablet Take 5 mg by mouth 2 (two) times daily.    [provider]  diltiazem (CARDIZEM CD) 240 MG 24 hr capsule Take 1 capsule (240 mg total) by mouth daily. 11/02/18   Sheikh, Omair Latif, DO  fluticasone (FLONASE) 50 MCG/ACT nasal spray Place 1 spray into both nostrils daily. 11/02/18   Raiford Noble Latif, DO  furosemide (LASIX) 40 MG tablet Take 1 tablet (40 mg total) by mouth daily. 11/02/18   Raiford Noble Latif, DO  gabapentin (NEURONTIN) 100 MG capsule Take 100 mg by mouth 2 (two) times daily.    [provider]  losartan (COZAAR) 25 MG tablet Take 1 tablet (25 mg total) by mouth daily. 11/02/18   Raiford Noble Latif, DO  metoprolol tartrate (LOPRESSOR) 25 MG tablet Take 1 tablet (25 mg total) by mouth 2 (two) times daily. 11/01/18   Raiford Noble Latif, DO  Multiple Vitamins-Minerals (CENTRUM SILVER 50+WOMEN) TABS Take 1 tablet by mouth daily.    [provider]  polyethylene glycol (MIRALAX / GLYCOLAX) packet Take 17 g by mouth daily. 11/01/18   Raiford Noble Latif, DO  potassium chloride SA (K-DUR,KLOR-CON) 20 MEQ tablet Take 1 tablet (20 mEq total)  by mouth 2 (two) times daily. 11/01/18   Sheikh, Omair Latif, DO  senna-docusate (SENOKOT-S) 8.6-50 MG tablet Take 1 tablet by mouth at bedtime as needed for mild constipation. 11/01/18   Raiford Noble Latif, DO  spironolactone (ALDACTONE) 25 MG tablet Take 0.5 tablets (12.5 mg total) by mouth daily. 11/02/18   Raiford Noble Latif, DO  traMADol (ULTRAM) 50 MG tablet Take 1 tablet (50 mg total) by mouth every 12 (twelve) hours as needed for moderate pain. 11/01/18   Kerney Elbe, DO    Family History Family History  Problem Relation Age of Onset  . Hypertension Mother     Social History Social History   Tobacco  Use  . Smoking status: Never Smoker  . Smokeless tobacco: Never Used  Substance Use Topics  . Alcohol use: Never    Frequency: Never  . Drug use: Never     Allergies   Iodine   Review of Systems Review of Systems  Unable to review systems secondary to dementia Physical Exam Updated Vital Signs BP (!) 146/70   Pulse 65   Temp 97.7 F (36.5 C) (Oral)   Resp 18   SpO2 99%   Physical Exam  Constitutional: She is oriented to person, place, and time. She appears well-developed and well-nourished. No distress.  HENT:  Head: Normocephalic and atraumatic.  Eyes: Conjunctivae are normal. No scleral icterus.  Neck: Normal range of motion.  Cardiovascular: Normal rate, regular rhythm and normal heart sounds. Exam reveals no gallop and no friction rub.  No murmur heard. Pulmonary/Chest: Effort normal. No respiratory distress. She has rales in the right middle field, the right lower field, the left middle field and the left lower field.  Abdominal: Soft. Bowel sounds are normal. She exhibits no distension and no mass. There is no tenderness. There is no guarding.  Musculoskeletal:    Nonpitting bilateral edema of the lower extremities  Neurological: She is alert and oriented to person, place, and time.  Skin: Skin is warm and dry. She is not diaphoretic.  Psychiatric: Her behavior is normal.  Nursing note and vitals reviewed.    ED Treatments / Results  Labs (all labs ordered are listed, but only abnormal results are displayed) Labs Reviewed  BASIC METABOLIC PANEL  CBC WITH DIFFERENTIAL/PLATELET  BRAIN NATRIURETIC PEPTIDE  I-STAT TROPONIN, ED    EKG EKG Interpretation  Date/Time:  Wednesday November 02 2018 16:02:04 EST Ventricular Rate:  74 PR Interval:    QRS Duration: 82 QT Interval:  398 QTC Calculation: 442 R Axis:   53 Text Interpretation:  Atrial fibrillation Anterior infarct, old Confirmed by Gerlene Fee [phone]) on 11/02/2018 6:18:28 PM   Radiology Dg  Chest Port 1 View  Result Date: 11/01/2018 CLINICAL DATA:  Shortness of breath EXAM: PORTABLE CHEST 1 VIEW COMPARISON:  10/30/2018 FINDINGS: Cardiac shadow remains enlarged. Aortic calcifications are again seen. Small effusions are again identified with some minimal basilar atelectasis. No significant central pulmonary edema is noted. Old rib fractures are noted on the right. IMPRESSION: Mild bibasilar atelectasis and effusions stable from the previous exam. Electronically Signed   By: Inez Catalina M.D.   On: 11/01/2018 08:05    Procedures Procedures (including critical care time)  Medications Ordered in ED Medications - No data to display   Initial Impression / Assessment and Plan / ED Course  I have reviewed the triage vital signs and the nursing notes.  Pertinent labs & imaging results that were available during my care of  the patient were reviewed by me and considered in my medical decision making (see chart for details).      84 year old female with mild dementia who presents with shortness of breath.  Patient was discharged yesterday after being admitted for acute respiratory failure in the setting of CHF exacerbation.  The emergent differential diagnosis for shortness of breath includes, but is not limited to, Pulmonary edema, bronchoconstriction, Pneumonia, Pulmonary embolism, Pneumotherax/ Hemothorax, Dysrythmia, ACS.  The patient's recent hospitalization felt it necessary along with Dr. Sedonia Small to assess for potential pulmonary embolus as the cause.  Patient had a positive d-dimer and therefore we proceeded with CT angiogram of the chest.  Patient does appear to have some stigmata of CHF however this seems to be less than when she was admitted/discharged with and her BNP is still decreased. Patient has maintained her oxygen saturations above 93% on room air throughout her entire visit.  She was supposed to go home on oxygen however apparently did not fail her ambulatory oxygen status as  she did not desaturate below 90%.  Patient does not appear clinically volume overloaded. Her A. fib is chronic and without evidence of RVR.  She has a negative troponin here in the emergency department.  She was able to ambulate in the emergency department with O2 sats above 93%.  Personally reviewed both the patient CT angios and chest x-ray and agree with radiologic interpretation.  The patient was seen and shared visit with Dr.Bero who agrees with work-up and plan for discharge at this time.The patient appears reasonably screened and/or stabilized for discharge and I doubt any other medical condition or other [facility] requiring further screening, evaluation, or treatment in the ED at this time prior to discharge.     Final Clinical Impressions(s) / ED Diagnoses   Final diagnoses:  SOB (shortness of breath)  Chronic pulmonary edema  Multinodular goiter    ED Discharge Orders    None       Margarita Mail, PA-C 11/03/18 1410    Maudie Flakes, MD 11/03/18 1538

## 2018-11-02 NOTE — ED Notes (Signed)
[facility] called for transport.  

## 2018-11-02 NOTE — Discharge Instructions (Addendum)
Get help right away if: You have severe chest pain, especially if the pain is crushing or pressure-like and spreads to the arms, back, neck, or jaw. THIS IS AN EMERGENCY. Do not wait to see if the pain will go away. Call for local emergency medical help. Do not drive yourself to the hospital. You have sweating, feel sick to your stomach (nauseous), or are experiencing shortness of breath. Your weight increases by 3 lb (1.4 kg) in 1 day or 5 lb (2.3 kg) in a week. You notice increasing shortness of breath that is unusual for you. This may happen during rest, sleep, or with activity. You develop chest pain (angina) or pain that is unusual for you. You notice more swelling in your hands, feet, ankles, or abdomen. You notice lasting (persistent) dizziness, blurred vision, headache, or unsteadiness. You begin to cough up bloody mucus (sputum). You are unable to sleep because it is hard to breathe. You begin to feel a jumping or fluttering sensation (palpitations) in the chest that is unusual for you.

## 2018-11-02 NOTE — ED Notes (Signed)
Pt resting quietly in hallway bed, will continue to monitor

## 2018-11-02 NOTE — ED Notes (Signed)
Ambulated pt with SpO2, maintained a solid 93%.

## 2018-11-02 NOTE — ED Notes (Signed)
Pt up to restroom with assistance, back to bed, will continue to monitor

## 2018-11-02 NOTE — ED Notes (Signed)
Daughter called with update about patient returning to [facility] via [place]

## 2018-11-02 NOTE — ED Notes (Signed)
Pts daughter called and updated, she is in [place] and will not be coming, requesting updates periodically regarding plan of care (admit/discharge)

## 2018-11-04 ENCOUNTER — Emergency Department (HOSPITAL_COMMUNITY): Payer: Medicare Other

## 2018-11-04 ENCOUNTER — Emergency Department (HOSPITAL_COMMUNITY)
Admission: EM | Admit: 2018-11-04 | Discharge: 2018-11-04 | Disposition: A | Discharge status: Home / Self Care | Payer: Medicare Other | Attending: Emergency Medicine | Admitting: Emergency Medicine

## 2018-11-04 ENCOUNTER — Encounter (HOSPITAL_COMMUNITY): Payer: Self-pay

## 2018-11-04 DIAGNOSIS — I110 Hypertensive heart disease with heart failure: Secondary | ICD-10-CM | POA: Diagnosis not present

## 2018-11-04 DIAGNOSIS — Z79899 Other long term (current) drug therapy: Secondary | ICD-10-CM | POA: Insufficient documentation

## 2018-11-04 DIAGNOSIS — R0602 Shortness of breath: Secondary | ICD-10-CM

## 2018-11-04 DIAGNOSIS — Z96659 Presence of unspecified artificial knee joint: Secondary | ICD-10-CM | POA: Insufficient documentation

## 2018-11-04 DIAGNOSIS — F0390 Unspecified dementia without behavioral disturbance: Secondary | ICD-10-CM | POA: Insufficient documentation

## 2018-11-04 DIAGNOSIS — I509 Heart failure, unspecified: Secondary | ICD-10-CM | POA: Insufficient documentation

## 2018-11-04 LAB — CBC
HCT: 39.6 % (ref 36.0–46.0)
Hemoglobin: 12.5 g/dL (ref 12.0–15.0)
MCH: 28.5 pg (ref 26.0–34.0)
MCHC: 31.6 g/dL (ref 30.0–36.0)
MCV: 90.2 fL (ref 80.0–100.0)
Platelets: 300 10*3/uL (ref 150–400)
RBC: 4.39 MIL/uL (ref 3.87–5.11)
RDW: 15.8 % — ABNORMAL HIGH (ref 11.5–15.5)
WBC: 9.3 10*3/uL (ref 4.0–10.5)
nRBC: 0 % (ref 0.0–0.2)

## 2018-11-04 LAB — BASIC METABOLIC PANEL
ANION GAP: 9 (ref 5–15)
BUN: 16 mg/dL (ref 8–23)
CO2: 24 mmol/L (ref 22–32)
Calcium: 9.3 mg/dL (ref 8.9–10.3)
Chloride: 100 mmol/L (ref 98–111)
Creatinine, Ser: 1.06 mg/dL — ABNORMAL HIGH (ref 0.44–1.00)
GFR, EST AFRICAN AMERICAN: 54 mL/min — AB (ref >60)
GFR, EST NON AFRICAN AMERICAN: 46 mL/min — AB (ref >60)
Glucose, Bld: 98 mg/dL (ref 70–99)
POTASSIUM: 4.7 mmol/L (ref 3.5–5.1)
SODIUM: 133 mmol/L — AB (ref 135–145)

## 2018-11-04 LAB — URINALYSIS, ROUTINE W REFLEX MICROSCOPIC
Bilirubin Urine: NEGATIVE
Glucose, UA: NEGATIVE mg/dL
Hgb urine dipstick: NEGATIVE
Ketones, ur: NEGATIVE mg/dL
LEUKOCYTES UA: NEGATIVE
Nitrite: NEGATIVE
PH: 7 (ref 5.0–8.0)
Protein, ur: NEGATIVE mg/dL
SPECIFIC GRAVITY, URINE: 1.01 (ref 1.005–1.030)

## 2018-11-04 LAB — I-STAT TROPONIN, ED: Troponin i, poc: 0.01 ng/mL (ref 0.00–0.08)

## 2018-11-04 LAB — BRAIN NATRIURETIC PEPTIDE: B Natriuretic Peptide: 669.7 pg/mL — ABNORMAL HIGH (ref 0.0–100.0)

## 2018-11-04 MED ORDER — FUROSEMIDE 10 MG/ML IJ SOLN
40.0000 mg | INTRAMUSCULAR | Status: AC
  Administered 2018-11-04: 40 mg via INTRAVENOUS
  Filled 2018-11-04: qty 4

## 2018-11-04 NOTE — ED Notes (Signed)
Pt ambulated with pulse ox O2 was 91 pt had steady gait required one person assist.

## 2018-11-04 NOTE — ED Notes (Signed)
Wheeled pt to restroom, pt is one assist.

## 2018-11-04 NOTE — ED Triage Notes (Signed)
Pt from [facility] via [facility]; c/o substernal cp and sob starting 30 min ago; hx emphysema, copd, afib; persistent cough present; pain increased during cough and palpation to chest; denies n/v/d; denies radiation; A and O x 3; pt on eliquis; pt is a DNR  164/70 97% RA CBG 115 RR 20 HR 70-100 afib

## 2018-11-04 NOTE — ED Provider Notes (Signed)
[facility] EMERGENCY DEPARTMENT Provider Note   CSN: [id number] Arrival date & time: 11/04/18  1633     History   Chief Complaint Chief Complaint  Patient presents with  . Chest Pain    HPI Nicole Barker is a 84 y.o. female.  84yo F w/ PMH including A fib on Eliquis, dementia, HTN, HLD who p/w shortness of breath.  Patient states that she began having shortness of breath approximately 30 minutes prior to EMS arrival to her nursing facility.  She tells me that she did not have chest pain although she told EMS that she was having some substernal chest pain.  Currently she states that her shortness of breath is better.  She denies any complaints of pain.  She has intermittent chronic cough.  She denies any nausea, vomiting, diarrhea, or other complaints.  LEVEL 5 CAVEAT DUE TO DEMENTIA  The history is provided by the patient.  Chest Pain      Past Medical History:  Diagnosis Date  . Anxiety   . Atrial fibrillation ([place])   . Dementia ([place])   . Hyperlipidemia   . Hypertension     Patient Active Problem List   Diagnosis Date Noted  . Acute heart failure ([place]) 10/25/2018  . Atrial fibrillation with RVR ([place]) 10/25/2018  . Dementia without behavioral disturbance ([place]) 10/25/2018  . Essential hypertension 10/25/2018  . Hyperlipidemia 10/25/2018  . AKI (acute kidney injury) ([place]) 10/25/2018    Past Surgical History:  Procedure Laterality Date  . ABDOMINAL HYSTERECTOMY    . BACK SURGERY    . JOINT REPLACEMENT     knee     OB History   None      Home Medications    Prior to Admission medications   Medication Sig Start Date End Date Taking? Authorizing Provider  acetaminophen (TYLENOL) 325 MG tablet Take 2 tablets (650 mg total) by mouth every 6 (six) hours as needed for mild pain or headache. 11/01/18  Yes Sheikh, Omair Latif, DO  amiodarone (PACERONE) 200 MG tablet Take 1 tablet (200 mg total) by mouth daily. 11/02/18  Yes Sheikh, Omair  Latif, DO  apixaban (ELIQUIS) 5 MG TABS tablet Take 5 mg by mouth 2 (two) times daily.   Yes [provider]  diltiazem (CARDIZEM CD) 240 MG 24 hr capsule Take 1 capsule (240 mg total) by mouth daily. 11/02/18  Yes Sheikh, Omair Latif, DO  fluticasone (FLONASE) 50 MCG/ACT nasal spray Place 1 spray into both nostrils daily. 11/02/18  Yes Sheikh, Omair Latif, DO  furosemide (LASIX) 40 MG tablet Take 1 tablet (40 mg total) by mouth daily. 11/02/18  Yes Sheikh, Omair Latif, DO  gabapentin (NEURONTIN) 100 MG capsule Take 100 mg by mouth 2 (two) times daily.   Yes [provider]  losartan (COZAAR) 25 MG tablet Take 1 tablet (25 mg total) by mouth daily. 11/02/18  Yes Sheikh, Omair Latif, DO  metoprolol tartrate (LOPRESSOR) 25 MG tablet Take 1 tablet (25 mg total) by mouth 2 (two) times daily. 11/01/18  Yes Sheikh, Omair Latif, DO  Multiple Vitamin (MULTIVITAMIN WITH MINERALS) TABS tablet Take 1 tablet by mouth daily. Centrum Silver   Yes [provider]  polyethylene glycol (MIRALAX / GLYCOLAX) packet Take 17 g by mouth daily. 11/01/18  Yes Sheikh, Omair Latif, DO  potassium chloride SA (K-DUR,KLOR-CON) 20 MEQ tablet Take 1 tablet (20 mEq total) by mouth 2 (two) times daily. 11/01/18  Yes Sheikh, Omair Latif, DO  senna-docusate (SENOKOT-S)  8.6-50 MG tablet Take 1 tablet by mouth at bedtime as needed for mild constipation. 11/01/18  Yes Sheikh, Omair Latif, DO  spironolactone (ALDACTONE) 25 MG tablet Take 0.5 tablets (12.5 mg total) by mouth daily. 11/02/18  Yes Sheikh, Omair Latif, DO  traMADol (ULTRAM) 50 MG tablet Take 1 tablet (50 mg total) by mouth every 12 (twelve) hours as needed for moderate pain. 11/01/18  Yes Kerney Elbe, DO    Family History Family History  Problem Relation Age of Onset  . Hypertension Mother     Social History Social History   Tobacco Use  . Smoking status: Never Smoker  . Smokeless tobacco: Never Used  Substance Use Topics  .  Alcohol use: Never    Frequency: Never  . Drug use: Never     Allergies   Iodine   Review of Systems Review of Systems  Unable to perform ROS: Dementia  Cardiovascular: Positive for chest pain.     Physical Exam Updated Vital Signs BP 123/84   Pulse 78   Temp 98.1 F (36.7 C) (Oral)   Resp (!) 24   Ht 4\' 11"  (1.499 m)   Wt 63.5 kg   SpO2 95%   BMI 28.28 kg/m   Physical Exam  Constitutional: She is oriented to person, place, and time. She appears well-developed and well-nourished. No distress.  HENT:  Head: Normocephalic and atraumatic.  Moist mucous membranes  Eyes: Conjunctivae are normal.  Neck: Neck supple.  Cardiovascular: Normal rate and normal heart sounds. An irregularly irregular rhythm present.  No murmur heard. Pulmonary/Chest: Effort normal and breath sounds normal. She has no wheezes.  Abdominal: Soft. Bowel sounds are normal. She exhibits no distension. There is no tenderness.  Musculoskeletal: She exhibits no edema.  Neurological: She is alert and oriented to person, place, and time.  Fluent speech  Skin: Skin is warm and dry.  Psychiatric: She has a normal mood and affect. Judgment normal.  Nursing note and vitals reviewed.    ED Treatments / Results  Labs (all labs ordered are listed, but only abnormal results are displayed) Labs Reviewed  BASIC METABOLIC PANEL - Abnormal; Notable for the following components:      Result Value   Sodium 133 (*)    Creatinine, Ser 1.06 (*)    GFR calc non Af Amer 46 (*)    GFR calc Af Amer 54 (*)    All other components within normal limits  CBC - Abnormal; Notable for the following components:   RDW 15.8 (*)    All other components within normal limits  BRAIN NATRIURETIC PEPTIDE - Abnormal; Notable for the following components:   B Natriuretic Peptide 669.7 (*)    All other components within normal limits  URINE CULTURE  URINALYSIS, ROUTINE W REFLEX MICROSCOPIC  I-STAT TROPONIN, ED    EKG EKG  Interpretation  Date/Time:  Friday November 04 2018 16:41:33 EST Ventricular Rate:  69 PR Interval:    QRS Duration: 81 QT Interval:  385 QTC Calculation: 413 R Axis:   93 Text Interpretation:  Atrial fibrillation Anterior infarct, old Interpretation limited secondary to artifact otherwise similar to previous Confirmed by Theotis Burrow [phone]) on 11/04/2018 5:08:58 PM   Radiology Dg Chest 2 View  Result Date: 11/04/2018 CLINICAL DATA:  Chest pain, shortness of breath EXAM: CHEST - 2 VIEW COMPARISON:  CTA chest dated 11/02/2018 FINDINGS: Cardiomegaly with pulmonary vascular congestion. No frank interstitial edema, improved. Mild patchy bilateral lower lobe opacities, likely atelectasis, with  associated small bilateral pleural effusions. No pneumothorax. Degenerative changes of the visualized thoracolumbar spine. IMPRESSION: Cardiomegaly with pulmonary vascular congestion. No frank interstitial edema, improved. Mild patchy bilateral opacities, likely atelectasis, with associated small bilateral pleural effusions. Electronically Signed   By: Julian Hy M.D.   On: 11/04/2018 17:47    Procedures Procedures (including critical care time)  Medications Ordered in ED Medications  furosemide (LASIX) injection 40 mg (40 mg Intravenous Given 11/04/18 1947)     Initial Impression / Assessment and Plan / ED Course  I have reviewed the triage vital signs and the nursing notes.  Pertinent labs & imaging results that were available during my care of the patient were reviewed by me and considered in my medical decision making (see chart for details).     Pt well appearing and comfortable, no complaints on my exam. VS reassuring, normal O2 sat. EKG reassuring. CXR shows cardiomegaly and mild congestion.   Of note, she presented here recently with similar complaints. Had CTA 11/13 that was overall reassuring, findings c/w CHF.   Lab work shows normal UA, negative troponin, creatinine 1.06,  BNP 670.  I suspect mild CHF exacerbation.  Her O2 sat has been normal here and she was able to ambulate without severe respiratory symptoms or hypoxia.  She was comfortable on reassessment and denied any problems ambulating.  Gave the patient Lasix here and instructed to increase Lasix to twice daily for the next 3 to 4 days and follow-up with PCP.  I discussed this plan with her daughter over the phone.  I have extensively reviewed return precautions.  Final Clinical Impressions(s) / ED Diagnoses   Final diagnoses:  Shortness of breath  Acute on chronic congestive heart failure, unspecified heart failure type [facility])    ED Discharge Orders    None       Santia Labate, Wenda Overland, MD 11/05/18 0030

## 2018-11-04 NOTE — Discharge Instructions (Addendum)
START TAKING LASIX 40MG  TWICE DAILY FOR THE NEXT 4 DAYS. THEN RESUME NORMAL DOSING OF ONCE DAILY. FOLLOW UP WITH YOUR PRIMARY CARE PROVIDER IN 3-4 DAYS FOR REASSESSMENT. RETURN TO ER IF ANY BREATHING PROBLEMS, CHEST PAIN, OR WORSENING SYMPTOMS.

## 2018-11-04 NOTE — ED Notes (Signed)
Contacted [facility] for tx back to [facility]

## 2018-11-07 LAB — URINE CULTURE

## 2018-11-08 ENCOUNTER — Telehealth: Payer: Self-pay | Admitting: Emergency Medicine

## 2018-11-08 NOTE — Telephone Encounter (Signed)
Post ED Visit - Positive Culture Follow-up  Culture report reviewed by antimicrobial stewardship pharmacist:  []  Elenor Quinones, Pharm.D. []  Heide Guile, Pharm.D., BCPS AQ-ID []  Parks Neptune, Pharm.D., BCPS []  Alycia Rossetti, Pharm.D., BCPS []  [place], Pharm.D., BCPS, AAHIVP []  Legrand Como, Pharm.D., BCPS, AAHIVP [x]  Salome Arnt, PharmD, BCPS []  Johnnette Gourd, PharmD, BCPS []  Hughes Better, PharmD, BCPS []  Leeroy Cha, PharmD  Positive urine culture No further patient follow-up is required at this time.  [place] Admir Candelas 11/08/2018, 11:03 AM

## 2018-11-22 ENCOUNTER — Encounter: Payer: Self-pay | Admitting: Cardiology

## 2018-11-22 ENCOUNTER — Ambulatory Visit (INDEPENDENT_AMBULATORY_CARE_PROVIDER_SITE_OTHER): Payer: Medicare Other | Admitting: Cardiology

## 2018-11-22 VITALS — BP 108/56 | HR 60 | Ht 59.0 in | Wt 151.0 lb

## 2018-11-22 DIAGNOSIS — I5041 Acute combined systolic (congestive) and diastolic (congestive) heart failure: Secondary | ICD-10-CM

## 2018-11-22 DIAGNOSIS — F0150 Vascular dementia without behavioral disturbance: Secondary | ICD-10-CM

## 2018-11-22 DIAGNOSIS — I429 Cardiomyopathy, unspecified: Secondary | ICD-10-CM | POA: Insufficient documentation

## 2018-11-22 DIAGNOSIS — I480 Paroxysmal atrial fibrillation: Secondary | ICD-10-CM

## 2018-11-22 DIAGNOSIS — N179 Acute kidney failure, unspecified: Secondary | ICD-10-CM

## 2018-11-22 DIAGNOSIS — I428 Other cardiomyopathies: Secondary | ICD-10-CM

## 2018-11-22 DIAGNOSIS — Z7901 Long term (current) use of anticoagulants: Secondary | ICD-10-CM

## 2018-11-22 DIAGNOSIS — I10 Essential (primary) hypertension: Secondary | ICD-10-CM

## 2018-11-22 MED ORDER — POTASSIUM CHLORIDE CRYS ER 20 MEQ PO TBCR: 20.0000 meq | EXTENDED_RELEASE_TABLET | Freq: Every day | ORAL | 4 refills | Status: DC

## 2018-11-22 NOTE — Progress Notes (Signed)
11/22/2018 Nicole Barker   03-08-1930  [id number]  Primary Physician Merlene Laughter, MD Primary Cardiologist: Dr Stanford Breed  HPI: Patient is a pleasant 84 year old female who recently moved to [place] from [place] to be closer to family.  She has a history of vascular dementia, hypertension, asthmatic COPD, and atrial fibrillation.  She had been on Multaq and Eliquis.  She was admitted 10/26/2018 to [facility] ER with tachycardia and dyspnea.  She also had acute kidney injury.  Echocardiogram revealed an EF of 45 to 50% with diffuse hypokinesis, mild left ear, moderate MR.  She had severe pulmonary hypertension as well.  She was placed on IV diuretics.  She improved with diuresis.  Diltiazem was added.  Because of her congestive heart failure she was taken off Multitak and started on amiodarone.  She was discharged to her skilled facility and is in the office today for follow-up.  She has been in the ED twice since discharge-11/13 for SOB and 11/15 for chest pain and SOB. Today at rest and he office she could not tell her heart rhythm was irregular and did not feel short of breath.  She was given Lasix in the ED with improvement.  Her weight on 10/25/2018 was 158 pounds.  At discharge her weight was recorded at 154 pounds, she is 150 pounds today.   Current Outpatient Medications  Medication Sig Dispense Refill  . acetaminophen (TYLENOL) 325 MG tablet Take 2 tablets (650 mg total) by mouth every 6 (six) hours as needed for mild pain or headache. 30 tablet 0  . albuterol (PROVENTIL HFA;VENTOLIN HFA) 108 (90 Base) MCG/ACT inhaler Inhale into the lungs every 6 (six) hours as needed for wheezing or shortness of breath.    Marland Kitchen amiodarone (PACERONE) 200 MG tablet Take 1 tablet (200 mg total) by mouth daily. 30 tablet 0  . apixaban (ELIQUIS) 5 MG TABS tablet Take 5 mg by mouth 2 (two) times daily.    Marland Kitchen diltiazem (CARDIZEM CD) 240 MG 24 hr capsule Take 1 capsule (240 mg total) by mouth  daily. 30 capsule 0  . fluticasone (FLONASE) 50 MCG/ACT nasal spray Place 1 spray into both nostrils daily. 16 g 2  . furosemide (LASIX) 40 MG tablet Take 1 tablet (40 mg total) by mouth daily. 30 tablet 0  . gabapentin (NEURONTIN) 100 MG capsule Take 100 mg by mouth 2 (two) times daily.    Marland Kitchen losartan (COZAAR) 25 MG tablet Take 1 tablet (25 mg total) by mouth daily. 30 tablet 0  . metoprolol tartrate (LOPRESSOR) 25 MG tablet Take 1 tablet (25 mg total) by mouth 2 (two) times daily. 60 tablet 0  . Multiple Vitamin (MULTIVITAMIN WITH MINERALS) TABS tablet Take 1 tablet by mouth daily. Centrum Silver    . polyethylene glycol (MIRALAX / GLYCOLAX) packet Take 17 g by mouth daily. 14 each 0  . potassium chloride SA (K-DUR,KLOR-CON) 20 MEQ tablet Take 1 tablet (20 mEq total) by mouth daily. 30 tablet 4  . senna-docusate (SENOKOT-S) 8.6-50 MG tablet Take 1 tablet by mouth at bedtime as needed for mild constipation. 30 tablet 0  . spironolactone (ALDACTONE) 25 MG tablet Take 0.5 tablets (12.5 mg total) by mouth daily. 30 tablet 0  . traMADol (ULTRAM) 50 MG tablet Take 1 tablet (50 mg total) by mouth every 12 (twelve) hours as needed for moderate pain. 10 tablet 0   No current facility-administered medications for this visit.     Allergies  Allergen Reactions  .  Iodine Other (See Comments)    Unknown reaction    Past Medical History:  Diagnosis Date  . Anxiety   . Atrial fibrillation ([place])   . Dementia ([place])   . Hyperlipidemia   . Hypertension     Social History   Socioeconomic History  . Marital status: Widowed    Spouse name: Not on file  . Number of children: Not on file  . Years of education: Not on file  . Highest education level: Not on file  Occupational History  . Not on file  Social Needs  . Financial resource strain: Not on file  . Food insecurity:    Worry: Not on file    Inability: Not on file  . Transportation needs:    Medical: Not on file    Non-medical: Not on  file  Tobacco Use  . Smoking status: Never Smoker  . Smokeless tobacco: Never Used  Substance and Sexual Activity  . Alcohol use: Never    Frequency: Never  . Drug use: Never  . Sexual activity: Not on file  Lifestyle  . Physical activity:    Days per week: Not on file    Minutes per session: Not on file  . Stress: Not on file  Relationships  . Social connections:    Talks on phone: Not on file    Gets together: Not on file    Attends religious service: Not on file    Active member of club or organization: Not on file    Attends meetings of clubs or organizations: Not on file    Relationship status: Not on file  . Intimate partner violence:    Fear of current or ex partner: Not on file    Emotionally abused: Not on file    Physically abused: Not on file    Forced sexual activity: Not on file  Other Topics Concern  . Not on file  Social History Narrative  . Not on file     Family History  Problem Relation Age of Onset  . Hypertension Mother      Review of Systems: General: negative for chills, fever, night sweats or weight changes.  Cardiovascular: negative for chest pain, edema, orthopnea, palpitations, paroxysmal nocturnal dyspnea  Dermatological: negative for rash Respiratory: negative for cough or wheezing Urologic: negative for hematuria Abdominal: negative for nausea, vomiting, diarrhea, bright red blood per rectum, melena, or hematemesis Neurologic: negative for visual changes, syncope, or dizziness All other systems reviewed and are otherwise negative except as noted above.    Blood pressure (!) 108/56, pulse 60, height 4\' 11"  (1.499 m), weight 151 lb (68.5 kg), SpO2 95 %.  General appearance: alert, cooperative, no distress and moderately obese Lungs: clear to auscultation bilaterally Heart: irregularly irregular rhythm Extremities: no ededma Neurologic: Grossly normal  EKG AF with VR 64, poor anterior RW  ASSESSMENT AND PLAN:   PAF (paroxysmal  atrial fibrillation) ([place]) Multaq changed to Amiodarone Nov 2019 after she presented with CHF   Acute heart failure ([place]) Admitted 11/6-/11/01/18 with CHF-most likely secondary to breakthrough PAF  Cardiomyopathy ([place]) Etiology not yet determined- EF 45-50% by echo 10/25/18 Moderate MR, mild AS  AKI (acute kidney injury) ([place]) Check BMP  Essential hypertension Borderline low  Dementia without behavioral disturbance ([facility]) Stable  Chronic anticoagulation Eliquis- CHADS VASC=5   PLAN  Decrease K+ to 20 Meq daily- check BMP- continue current Rx for now.   Kerin Ransom PA-C 11/22/2018 3:14 PM

## 2018-11-22 NOTE — Assessment & Plan Note (Signed)
Admitted 11/6-/11/01/18 with CHF-most likely secondary to breakthrough PAF

## 2018-11-22 NOTE — Assessment & Plan Note (Signed)
Borderline low

## 2018-11-22 NOTE — Assessment & Plan Note (Signed)
Multaq changed to Amiodarone Nov 2019 after she presented with CHF

## 2018-11-22 NOTE — Patient Instructions (Signed)
Medication Instructions:  DECREASE Potassium to 1 tablet once a day If you need a refill on your cardiac medications before your next appointment, please call your pharmacy.   Lab work: Your physician recommends that you return for lab work in: TODAY-BMET If you have labs (blood work) drawn today and your tests are completely normal, you will receive your results only by: Marland Kitchen MyChart Message (if you have MyChart) OR . A paper copy in the mail If you have any lab test that is abnormal or we need to change your treatment, we will call you to review the results.  Testing/Procedures: NONE   Follow-Up: At [facility], you and your health needs are our priority.  As part of our continuing mission to provide you with exceptional heart care, we have created designated Provider Care Teams.  These Care Teams include your primary Cardiologist (physician) and Advanced Practice Providers (APPs -  Physician Assistants and Nurse Practitioners) who all work together to provide you with the care you need, when you need it. . Your physician recommends that you schedule a follow-up appointment in: 2-3 [place].  Any Other Special Instructions Will Be Listed Below (If Applicable).

## 2018-11-22 NOTE — Assessment & Plan Note (Signed)
Check BMP .  

## 2018-11-22 NOTE — Assessment & Plan Note (Signed)
Etiology not yet determined- EF 45-50% by echo 10/25/18 Moderate MR, mild AS

## 2018-11-22 NOTE — Assessment & Plan Note (Signed)
Eliquis- CHADS VASC=5

## 2018-11-22 NOTE — Assessment & Plan Note (Signed)
Stable

## 2018-11-23 ENCOUNTER — Other Ambulatory Visit: Payer: Self-pay

## 2018-11-23 ENCOUNTER — Telehealth: Payer: Self-pay | Admitting: Cardiology

## 2018-11-23 DIAGNOSIS — I5041 Acute combined systolic (congestive) and diastolic (congestive) heart failure: Secondary | ICD-10-CM

## 2018-11-23 DIAGNOSIS — I10 Essential (primary) hypertension: Secondary | ICD-10-CM

## 2018-11-23 LAB — BASIC METABOLIC PANEL
BUN/Creatinine Ratio: 18 (ref 12–28)
BUN: 24 mg/dL (ref 8–27)
CO2: 23 mmol/L (ref 20–29)
Calcium: 9.6 mg/dL (ref 8.7–10.3)
Chloride: 93 mmol/L — ABNORMAL LOW (ref 96–106)
Creatinine, Ser: 1.36 mg/dL — ABNORMAL HIGH (ref 0.57–1.00)
GFR calc Af Amer: 41 mL/min/{1.73_m2} — ABNORMAL LOW (ref >59)
GFR calc non Af Amer: 35 mL/min/{1.73_m2} — ABNORMAL LOW (ref >59)
Glucose: 85 mg/dL (ref 65–99)
Potassium: 5.3 mmol/L — ABNORMAL HIGH (ref 3.5–5.2)
Sodium: 133 mmol/L — ABNORMAL LOW (ref 134–144)

## 2018-11-23 NOTE — Addendum Note (Signed)
Addended by: Lamar Laundry on: 11/23/2018 02:17 PM   Modules accepted: Orders

## 2018-11-23 NOTE — Telephone Encounter (Signed)
  Ms Carlis Abbott faxed over an order to [facility] patient Potassium and have BNP drawn in 2 weeks. She needs it signed and faxed back soon as possible to [phone]

## 2018-11-23 NOTE — Telephone Encounter (Signed)
Received fax, had [place] PA to sign, and faxed back.

## 2018-12-15 ENCOUNTER — Emergency Department (HOSPITAL_COMMUNITY): Payer: Medicare Other

## 2018-12-15 ENCOUNTER — Other Ambulatory Visit: Payer: Self-pay

## 2018-12-15 ENCOUNTER — Inpatient Hospital Stay (HOSPITAL_COMMUNITY)
Admission: EM | Admit: 2018-12-15 | Discharge: 2018-12-17 | DRG: 308 | Disposition: A | Discharge status: Nursing Home (Includes ICF) | Payer: Medicare Other | Source: Skilled Nursing Facility | Attending: Family Medicine | Admitting: Family Medicine

## 2018-12-15 ENCOUNTER — Encounter (HOSPITAL_COMMUNITY): Payer: Self-pay | Admitting: Emergency Medicine

## 2018-12-15 DIAGNOSIS — F0150 Vascular dementia without behavioral disturbance: Secondary | ICD-10-CM | POA: Diagnosis present

## 2018-12-15 DIAGNOSIS — J449 Chronic obstructive pulmonary disease, unspecified: Secondary | ICD-10-CM | POA: Diagnosis present

## 2018-12-15 DIAGNOSIS — I700 Atherosclerosis of aorta: Secondary | ICD-10-CM | POA: Diagnosis present

## 2018-12-15 DIAGNOSIS — I130 Hypertensive heart and chronic kidney disease with heart failure and stage 1 through stage 4 chronic kidney disease, or unspecified chronic kidney disease: Secondary | ICD-10-CM | POA: Diagnosis present

## 2018-12-15 DIAGNOSIS — R0902 Hypoxemia: Secondary | ICD-10-CM | POA: Diagnosis present

## 2018-12-15 DIAGNOSIS — Z79899 Other long term (current) drug therapy: Secondary | ICD-10-CM | POA: Diagnosis not present

## 2018-12-15 DIAGNOSIS — I440 Atrioventricular block, first degree: Secondary | ICD-10-CM | POA: Diagnosis present

## 2018-12-15 DIAGNOSIS — F419 Anxiety disorder, unspecified: Secondary | ICD-10-CM | POA: Diagnosis present

## 2018-12-15 DIAGNOSIS — I272 Pulmonary hypertension, unspecified: Secondary | ICD-10-CM | POA: Diagnosis present

## 2018-12-15 DIAGNOSIS — Z888 Allergy status to other drugs, medicaments and biological substances status: Secondary | ICD-10-CM | POA: Diagnosis not present

## 2018-12-15 DIAGNOSIS — I5043 Acute on chronic combined systolic (congestive) and diastolic (congestive) heart failure: Secondary | ICD-10-CM | POA: Diagnosis present

## 2018-12-15 DIAGNOSIS — I495 Sick sinus syndrome: Secondary | ICD-10-CM | POA: Diagnosis not present

## 2018-12-15 DIAGNOSIS — I080 Rheumatic disorders of both mitral and aortic valves: Secondary | ICD-10-CM | POA: Diagnosis present

## 2018-12-15 DIAGNOSIS — Z7951 Long term (current) use of inhaled steroids: Secondary | ICD-10-CM

## 2018-12-15 DIAGNOSIS — Z7901 Long term (current) use of anticoagulants: Secondary | ICD-10-CM

## 2018-12-15 DIAGNOSIS — I10 Essential (primary) hypertension: Secondary | ICD-10-CM | POA: Diagnosis not present

## 2018-12-15 DIAGNOSIS — E876 Hypokalemia: Secondary | ICD-10-CM | POA: Diagnosis present

## 2018-12-15 DIAGNOSIS — Z79891 Long term (current) use of opiate analgesic: Secondary | ICD-10-CM

## 2018-12-15 DIAGNOSIS — N183 Chronic kidney disease, stage 3 (moderate): Secondary | ICD-10-CM | POA: Diagnosis present

## 2018-12-15 DIAGNOSIS — Z90710 Acquired absence of both cervix and uterus: Secondary | ICD-10-CM

## 2018-12-15 DIAGNOSIS — E785 Hyperlipidemia, unspecified: Secondary | ICD-10-CM | POA: Diagnosis present

## 2018-12-15 DIAGNOSIS — I5041 Acute combined systolic (congestive) and diastolic (congestive) heart failure: Secondary | ICD-10-CM | POA: Diagnosis not present

## 2018-12-15 DIAGNOSIS — R0602 Shortness of breath: Secondary | ICD-10-CM

## 2018-12-15 DIAGNOSIS — N179 Acute kidney failure, unspecified: Secondary | ICD-10-CM | POA: Diagnosis present

## 2018-12-15 DIAGNOSIS — I480 Paroxysmal atrial fibrillation: Secondary | ICD-10-CM | POA: Diagnosis present

## 2018-12-15 DIAGNOSIS — Z8249 Family history of ischemic heart disease and other diseases of the circulatory system: Secondary | ICD-10-CM | POA: Diagnosis not present

## 2018-12-15 DIAGNOSIS — Z66 Do not resuscitate: Secondary | ICD-10-CM | POA: Diagnosis present

## 2018-12-15 DIAGNOSIS — R001 Bradycardia, unspecified: Principal | ICD-10-CM | POA: Diagnosis present

## 2018-12-15 LAB — CBC
HCT: 36.6 % (ref 36.0–46.0)
HEMOGLOBIN: 11.5 g/dL — AB (ref 12.0–15.0)
MCH: 28.6 pg (ref 26.0–34.0)
MCHC: 31.4 g/dL (ref 30.0–36.0)
MCV: 91 fL (ref 80.0–100.0)
Platelets: 236 10*3/uL (ref 150–400)
RBC: 4.02 MIL/uL (ref 3.87–5.11)
RDW: 16.4 % — ABNORMAL HIGH (ref 11.5–15.5)
WBC: 9.6 10*3/uL (ref 4.0–10.5)
nRBC: 0 % (ref 0.0–0.2)

## 2018-12-15 LAB — I-STAT TROPONIN, ED: TROPONIN I, POC: 0.01 ng/mL (ref 0.00–0.08)

## 2018-12-15 LAB — BASIC METABOLIC PANEL
Anion gap: 8 (ref 5–15)
BUN: 19 mg/dL (ref 8–23)
CO2: 20 mmol/L — ABNORMAL LOW (ref 22–32)
Calcium: 8.3 mg/dL — ABNORMAL LOW (ref 8.9–10.3)
Chloride: 108 mmol/L (ref 98–111)
Creatinine, Ser: 1.3 mg/dL — ABNORMAL HIGH (ref 0.44–1.00)
GFR, EST AFRICAN AMERICAN: 43 mL/min — AB (ref >60)
GFR, EST NON AFRICAN AMERICAN: 37 mL/min — AB (ref >60)
Glucose, Bld: 105 mg/dL — ABNORMAL HIGH (ref 70–99)
POTASSIUM: 4.7 mmol/L (ref 3.5–5.1)
SODIUM: 136 mmol/L (ref 135–145)

## 2018-12-15 LAB — BRAIN NATRIURETIC PEPTIDE: B Natriuretic Peptide: 880.8 pg/mL — ABNORMAL HIGH (ref 0.0–100.0)

## 2018-12-15 MED ORDER — HYDRALAZINE HCL 20 MG/ML IJ SOLN: 2.0000 mg | INTRAMUSCULAR | Status: DC | PRN

## 2018-12-15 MED ORDER — FUROSEMIDE 10 MG/ML IJ SOLN
20.0000 mg | Freq: Once | INTRAMUSCULAR | Status: AC
  Administered 2018-12-16: 20 mg via INTRAVENOUS
  Filled 2018-12-15: qty 2

## 2018-12-15 MED ORDER — FUROSEMIDE 10 MG/ML IJ SOLN
20.0000 mg | Freq: Once | INTRAMUSCULAR | Status: AC
  Administered 2018-12-15: 20 mg via INTRAVENOUS
  Filled 2018-12-15: qty 2

## 2018-12-15 MED ORDER — FLUTICASONE PROPIONATE 50 MCG/ACT NA SUSP
1.0000 | Freq: Every day | NASAL | Status: DC
  Filled 2018-12-15: qty 16

## 2018-12-15 MED ORDER — SENNOSIDES-DOCUSATE SODIUM 8.6-50 MG PO TABS: 1.0000 | ORAL_TABLET | Freq: Every evening | ORAL | Status: DC | PRN

## 2018-12-15 MED ORDER — AMLODIPINE BESYLATE 2.5 MG PO TABS
2.5000 mg | ORAL_TABLET | Freq: Every day | ORAL | Status: DC
  Administered 2018-12-16 – 2018-12-17 (×3): 2.5 mg via ORAL
  Filled 2018-12-15 (×3): qty 1

## 2018-12-15 MED ORDER — FUROSEMIDE 10 MG/ML IJ SOLN
40.0000 mg | Freq: Two times a day (BID) | INTRAMUSCULAR | Status: DC
  Administered 2018-12-16: 40 mg via INTRAVENOUS
  Filled 2018-12-15: qty 4

## 2018-12-15 MED ORDER — APIXABAN 5 MG PO TABS
5.0000 mg | ORAL_TABLET | Freq: Two times a day (BID) | ORAL | Status: DC
  Administered 2018-12-16 – 2018-12-17 (×4): 5 mg via ORAL
  Filled 2018-12-15 (×4): qty 1

## 2018-12-15 MED ORDER — ALBUTEROL SULFATE 0.63 MG/3ML IN NEBU: 1.0000 | INHALATION_SOLUTION | Freq: Three times a day (TID) | RESPIRATORY_TRACT | Status: DC | PRN

## 2018-12-15 MED ORDER — FUROSEMIDE 10 MG/ML IJ SOLN
INTRAMUSCULAR | Status: AC
  Filled 2018-12-15: qty 2

## 2018-12-15 MED ORDER — SODIUM CHLORIDE 0.9% FLUSH: 3.0000 mL | INTRAVENOUS | Status: DC | PRN

## 2018-12-15 MED ORDER — FLUTICASONE PROPIONATE 50 MCG/ACT NA SUSP
1.0000 | Freq: Every day | NASAL | Status: DC
  Administered 2018-12-16 – 2018-12-17 (×2): 1 via NASAL
  Filled 2018-12-15: qty 16

## 2018-12-15 MED ORDER — SODIUM CHLORIDE 0.9 % IV SOLN
250.0000 mL | INTRAVENOUS | Status: DC | PRN
  Administered 2018-12-16: 250 mL via INTRAVENOUS

## 2018-12-15 MED ORDER — SODIUM CHLORIDE 0.9% FLUSH
3.0000 mL | Freq: Two times a day (BID) | INTRAVENOUS | Status: DC
  Administered 2018-12-16 – 2018-12-17 (×4): 3 mL via INTRAVENOUS

## 2018-12-15 MED ORDER — ONDANSETRON HCL 4 MG/2ML IJ SOLN: 4.0000 mg | Freq: Four times a day (QID) | INTRAMUSCULAR | Status: DC | PRN

## 2018-12-15 MED ORDER — LOSARTAN POTASSIUM 50 MG PO TABS
50.0000 mg | ORAL_TABLET | Freq: Every day | ORAL | Status: DC
  Administered 2018-12-16: 50 mg via ORAL
  Filled 2018-12-15: qty 1

## 2018-12-15 MED ORDER — ALBUTEROL SULFATE (2.5 MG/3ML) 0.083% IN NEBU: 2.5000 mg | INHALATION_SOLUTION | Freq: Three times a day (TID) | RESPIRATORY_TRACT | Status: DC | PRN

## 2018-12-15 MED ORDER — ADULT MULTIVITAMIN W/MINERALS CH
1.0000 | ORAL_TABLET | Freq: Every day | ORAL | Status: DC
  Administered 2018-12-16 – 2018-12-17 (×2): 1 via ORAL
  Filled 2018-12-15 (×3): qty 1

## 2018-12-15 MED ORDER — POLYETHYLENE GLYCOL 3350 17 G PO PACK
17.0000 g | PACK | Freq: Every day | ORAL | Status: DC
  Administered 2018-12-16 – 2018-12-17 (×2): 17 g via ORAL
  Filled 2018-12-15 (×2): qty 1

## 2018-12-15 MED ORDER — LOSARTAN POTASSIUM 25 MG PO TABS: 25.0000 mg | ORAL_TABLET | Freq: Every day | ORAL | Status: DC

## 2018-12-15 MED ORDER — ACETAMINOPHEN 325 MG PO TABS: 650.0000 mg | ORAL_TABLET | ORAL | Status: DC | PRN

## 2018-12-15 MED ORDER — GABAPENTIN 100 MG PO CAPS
100.0000 mg | ORAL_CAPSULE | Freq: Two times a day (BID) | ORAL | Status: DC
  Administered 2018-12-16 – 2018-12-17 (×4): 100 mg via ORAL
  Filled 2018-12-15 (×4): qty 1

## 2018-12-15 NOTE — ED Notes (Signed)
Patient ambulatory to bathroom with steady gait at this time with a 1 person assist.

## 2018-12-15 NOTE — Consult Note (Addendum)
Cardiology Consultation:   Patient ID: Nicole Barker; [id number]; 1930-02-18   Admit date: 12/15/2018 Date of Consult: 12/15/2018  Primary Care Provider: Merlene Laughter, MD Primary Cardiologist: Kirk Ruths, MD Primary Electrophysiologist:  None   Patient Profile:   Nicole Barker is a 84 y.o. female with a hx of PAF on Cardizem, amio and Eliquis, CHADS VASC= 5 (age x 2, female, CHF, HTN)  vascular dementia, HTN, HLD, asthmatic COPD, who is being seen today for the evaluation of bradycardia at the request of Dr Erin Hearing.  History of Present Illness:   Nicole Barker was admitted 11/5-11/11/2018 with heart failure, ER visits 11/13 and 11/15 for shortness of breath and possibly chest pain.   Office visit, 12/3 patient did not feel short of breath, weight stable at 150 pounds, CHF was felt secondary to breakthrough atrial fibrillation.  Blood pressure 108/56 with a heart rate of 60.  Nicole Barker was staying with her daughter over the Christmas holiday.  Her daughter was vigilant about sodium and does not feel there was significant sodium indiscretion.  She noticed that her mother's feet were swelling.  When she took her mother back to [place], she noticed that her mother was more short of breath than usual.  She also was coughing which was not new.  At [facility], they checked her oxygen saturation and it was decreased.  She was sent to the ER, where her O2 sats were 94% on room air.  She was also noted to be bradycardic.  Cardiology was asked to evaluate her.  Nicole Barker gets very short of breath whenever she does anything, such as getting in and out of bed.  She has not had chest pain.  She denies being lightheaded or dizzy.  She was sitting at the table when she was at her daughter's house and had an episode of decreased level of consciousness where she slumped over and was not answering.  This was brief, EMS was not called.  No vital signs were taken.  Patient  does not remember this.   Past Medical History:  Diagnosis Date  . Anxiety   . Atrial fibrillation ([place])   . Dementia ([place])   . Hyperlipidemia   . Hypertension     Past Surgical History:  Procedure Laterality Date  . ABDOMINAL HYSTERECTOMY    . BACK SURGERY    . JOINT REPLACEMENT     knee     Prior to Admission medications   Medication Sig Start Date End Date Taking? Authorizing Provider  acetaminophen (TYLENOL) 325 MG tablet Take 2 tablets (650 mg total) by mouth every 6 (six) hours as needed for mild pain or headache. 11/01/18  Yes Sheikh, Omair Latif, DO  albuterol (ACCUNEB) 0.63 MG/3ML nebulizer solution Take 1 ampule by nebulization every 8 (eight) hours as needed for shortness of breath.   Yes [provider]  amiodarone (PACERONE) 200 MG tablet Take 1 tablet (200 mg total) by mouth daily. 11/02/18  Yes Sheikh, Omair Latif, DO  apixaban (ELIQUIS) 5 MG TABS tablet Take 5 mg by mouth 2 (two) times daily.   Yes [provider]  diltiazem (CARDIZEM CD) 240 MG 24 hr capsule Take 1 capsule (240 mg total) by mouth daily. 11/02/18  Yes Sheikh, Omair Latif, DO  fluticasone (FLONASE) 50 MCG/ACT nasal spray Place 1 spray into both nostrils daily. 11/02/18  Yes Sheikh, Omair Latif, DO  gabapentin (NEURONTIN) 100 MG capsule Take 100 mg by mouth 2 (two) times daily.   Yes [provider]  losartan (COZAAR) 25 MG tablet Take 1 tablet (25 mg total) by mouth daily. 11/02/18  Yes Sheikh, Omair Latif, DO  metoprolol tartrate (LOPRESSOR) 25 MG tablet Take 1 tablet (25 mg total) by mouth 2 (two) times daily. 11/01/18  Yes Sheikh, Omair Latif, DO  Multiple Vitamin (MULTIVITAMIN WITH MINERALS) TABS tablet Take 1 tablet by mouth daily. Centrum Silver   Yes [provider]  polyethylene glycol (MIRALAX / GLYCOLAX) packet Take 17 g by mouth daily. 11/01/18  Yes Sheikh, [place], DO  senna-docusate (SENOKOT-S) 8.6-50 MG tablet Take 1 tablet by mouth at bedtime as  needed for mild constipation. 11/01/18  Yes Sheikh, Omair Latif, DO  traMADol (ULTRAM) 50 MG tablet Take 1 tablet (50 mg total) by mouth every 12 (twelve) hours as needed for moderate pain. 11/01/18  Yes Sheikh, Omair Latif, DO  furosemide (LASIX) 40 MG tablet Take 1 tablet (40 mg total) by mouth daily. Patient not taking: Reported on 12/15/2018 11/02/18   Raiford Noble Latif, DO  potassium chloride SA (K-DUR,KLOR-CON) 20 MEQ tablet Take 1 tablet (20 mEq total) by mouth daily. Patient not taking: Reported on 12/15/2018 11/22/18   Erlene Quan, PA-C  spironolactone (ALDACTONE) 25 MG tablet Take 0.5 tablets (12.5 mg total) by mouth daily. Patient not taking: Reported on 12/15/2018 11/02/18   Kerney Elbe, DO    Inpatient Medications: Scheduled Meds: . apixaban  5 mg Oral BID  . [START ON 12/16/2018] fluticasone  1 spray Each Nare Daily  . gabapentin  100 mg Oral BID  . [START ON 12/16/2018] losartan  25 mg Oral Daily  . multivitamin with minerals  1 tablet Oral Daily  . [START ON 12/16/2018] polyethylene glycol  17 g Oral Daily  . sodium chloride flush  3 mL Intravenous Q12H   Continuous Infusions: . sodium chloride     PRN Meds: sodium chloride, acetaminophen, albuterol, hydrALAZINE, ondansetron (ZOFRAN) IV, senna-docusate, sodium chloride flush  Allergies:    Allergies  Allergen Reactions  . Iodine Other (See Comments)    Unknown reaction    Social History:   Social History   Socioeconomic History  . Marital status: Widowed    Spouse name: Not on file  . Number of children: Not on file  . Years of education: Not on file  . Highest education level: Not on file  Occupational History  . Not on file  Social Needs  . Financial resource strain: Not on file  . Food insecurity:    Worry: Not on file    Inability: Not on file  . Transportation needs:    Medical: Not on file    Non-medical: Not on file  Tobacco Use  . Smoking status: Never Smoker  . Smokeless  tobacco: Never Used  Substance and Sexual Activity  . Alcohol use: Never    Frequency: Never  . Drug use: Never  . Sexual activity: Not on file  Lifestyle  . Physical activity:    Days per week: Not on file    Minutes per session: Not on file  . Stress: Not on file  Relationships  . Social connections:    Talks on phone: Not on file    Gets together: Not on file    Attends religious service: Not on file    Active member of club or organization: Not on file    Attends meetings of clubs or organizations: Not on file    Relationship status: Not on file  .  Intimate partner violence:    Fear of current or ex partner: Not on file    Emotionally abused: Not on file    Physically abused: Not on file    Forced sexual activity: Not on file  Other Topics Concern  . Not on file  Social History Narrative  . Not on file    Family History:   Family History  Problem Relation Age of Onset  . Hypertension Mother    Family Status:  Family Status  Relation Name Status  . Mother  Deceased  . Father  Deceased    ROS:  Please see the history of present illness.  All other ROS reviewed and negative.     Physical Exam/Data:   Vitals:   12/15/18 1743 12/15/18 1800 12/15/18 1853 12/15/18 1856  BP:    (!) 188/77  Pulse:  (!) 43  (!) 46  Resp:  (!) 33  20  Temp:    98.4 F (36.9 C)  TempSrc:    Oral  SpO2:  95%  96%  Weight: 72.9 kg  71.3 kg   Height:   _0  (1.499 m)     Intake/Output Summary (Last 24 hours) at 12/15/2018 2008 Last data filed at 12/15/2018 2000 Gross per 24 hour  Intake 120 ml  Output 100 ml  Net 20 ml   Filed Weights   12/15/18 1433 12/15/18 1743 12/15/18 1853  Weight: 68 kg 72.9 kg 71.3 kg   Body mass index is 31.75 kg/m.  General:  Well nourished, well developed, in no acute distress HEENT: normal Lymph: no adenopathy Neck: JVD 9 cm Endocrine:  No thryomegaly Vascular: No carotid bruits; 4/4 extremity pulses 2+ Cardiac:  normal S1, S2; slow but  RRR; soft murmur  Lungs: Decreased breath sounds bases with rales bilaterally, no wheezing, rhonchi Abd: soft, nontender, no hepatomegaly  Ext: Trace pedal edema Musculoskeletal:  No deformities, BUE and BLE strength normal and equal Skin: warm and dry  Neuro:  CNs 2-12 intact, no focal abnormalities noted Psych:  Normal affect   EKG:  The EKG was personally reviewed and demonstrates: Junctional rhythm, heart rate 42, no P waves seen Telemetry:  Telemetry was personally reviewed and demonstrates: Junctional rhythm with some sinus bradycardia, intermittent P waves seen  Relevant CV Studies:  ECHO: 10/25/2018 - Left ventricle: The cavity size was normal. Wall thickness was   increased in a pattern of mild LVH. Systolic function was mildly   reduced. The estimated ejection fraction was in the range of 45%   to 50%. Diffuse hypokinesis. Doppler parameters are consistent   with high ventricular filling pressure. - Aortic valve: Valve mobility was restricted. There was mild   stenosis. There was mild regurgitation. - Mitral valve: Calcified annulus. The findings are consistent with   mild stenosis. There was moderate regurgitation. - Left atrium: The atrium was mildly dilated. - Tricuspid valve: There was moderate regurgitation. - Pulmonary arteries: Systolic pressure was moderately to severely   increased. PA peak pressure: 65 mm Hg (S). - Pericardium, extracardiac: There was a left pleural effusion.  Impressions:  - Mild global reduction in LV systolic function; mild LVH;   calcified aortic valve with mild AS (mean gradient 15 mmHg) and   mild AI; MAC with mild MS (mean gradient 5 mmHg); moderate MR;   mild LAE; moderate TR; moderate to severe pulmonary hypertension.   Laboratory Data:  Chemistry Recent Labs  Lab 12/15/18 1442  NA 136  K 4.7  CL  108  CO2 20*  GLUCOSE 105*  BUN 19  CREATININE 1.30*  CALCIUM 8.3*  GFRNONAA 37*  GFRAA 43*  ANIONGAP 8    Lab Results    Component Value Date   ALT 17 11/01/2018   AST 27 11/01/2018   ALKPHOS 145 (H) 11/01/2018   BILITOT 0.6 11/01/2018   Hematology Recent Labs  Lab 12/15/18 1442  WBC 9.6  RBC 4.02  HGB 11.5*  HCT 36.6  MCV 91.0  MCH 28.6  MCHC 31.4  RDW 16.4*  PLT 236   Cardiac EnzymesNo results for input(s): TROPONINI in the last 168 hours.  Recent Labs  Lab 12/15/18 1447  TROPIPOC 0.01    BNP Recent Labs  Lab 12/15/18 1611  BNP 880.8*    Magnesium:  Magnesium  Date Value Ref Range Status  11/01/2018 2.0 1.7 - 2.4 mg/dL Final    Comment:    Performed at [facility], [place] [address], [place], [place] [address]     Radiology/Studies:  Dg Chest Port 1 View  Result Date: 12/15/2018 CLINICAL DATA:  Dyspnea starting 2 days ago EXAM: PORTABLE CHEST 1 VIEW COMPARISON:  11/04/2018 FINDINGS: Stable cardiomegaly with aortic atherosclerosis. Clearing of bibasilar opacities from each lung base especially on left. Chronic bronchitic change of the lungs with bilateral upper lobe pulmonary hyperinflation and crowding of interstitial lower lung markings. IMPRESSION: Cardiomegaly with aortic atherosclerosis. Chronic bronchitic change of the lungs. Clearing of bibasilar pulmonary opacities. Electronically Signed   By: Ashley Royalty M.D.   On: 12/15/2018 16:20    Assessment and Plan:   1.  Symptomatic bradycardia: - Because of problems with atrial fibrillation, she has been on Cardizem CD 240 mg in addition to amiodarone. - Hold the Cardizem, continue the amiodarone for now - If her heart rate does not improve off the Cardizem, may have to hold the amiodarone as well. -If her heart rate does not improve after 48 hours of the Cardizem, EP consult for possible pacemaker  2.  Acute on chronic diastolic CHF: She has volume overload by exam, her weight is up about 10 pounds, and she is hypoxic. - Feel Lasix dosing should be 40 mg IV twice daily for now. - Follow daily standing  weights, intake/output and renal function  3.  Hypertension: She will need improved blood pressure control off the Cardizem - Add amlodipine 5 mg daily   Otherwise per IM Active Problems:   SOB (shortness of breath)   Bradycardia  For questions or updates, please contact [place] Please consult [email or web address] for contact info under Cardiology/STEMI.   Signed, Rosaria Ferries, PA-C  12/15/2018 8:08 PM   The patient was seen, examined and discussed with Rosaria Ferries, PA-C and I agree with the above.   This patient is a she seems to be happily demented to me know pleasant 84 year old female who recently moved to [place] from [place] to be closer to family.  She has a history of vascular dementia, hypertension, asthmatic COPD, and atrial fibrillation.  She had been on Multaq and Eliquis.  She was admitted 10/26/2018 to [facility] long ER with tachycardia and dyspnea.  She also had acute kidney injury.  Echocardiogram revealed an EF of 45 to 50% with diffuse hypokinesis, mild left ear, moderate MR.  She had severe pulmonary hypertension as well.  She was placed on IV diuretics.  She improved with diuresis.  Diltiazem and amiodarone was added.  Because of her congestive heart failure she was taken  off Multitak and started on amiodarone.  She was discharged to her skilled facility with a weight of 150 pounds.  Today's weight 158 pounds. She spent Christmas with her daughter and seem to be having no problems with breathing or lower extremity edema however today daughter was called from nursing home for worsening lower extremity edema and complaints of shortness of breath.  Patient also had a presyncopal episode.  On physical exam she has crackles in both bases, she has +1 nonpitting bilateral lower extremity edema, lower extremities are somewhat cold and weak pulses are felt.  Telemetry shows sinus bradycardia alternating with junctional rhythm with ventricular rates around 40 bpm.  Patient  currently denies any dizziness.  Chest x-ray shows bilateral pulmonary edema.  Creatinine 1.3 up from 1.1, BNP 880, hemoglobin 11.5, potassium 4.7, magnesium not checked yet.   Assessment and plan: Acute on chronic combined systolic diastolic CHF Paroxysmal atrial fibrillation Junctional bradycardia in the settings of amiodarone and Cardizem use Hypertension  Plan: Start Lasix IV 40 twice daily with first dose now Check magnesium Follow creatinine and potassium closely Strict I's and O's, baseline weight 150 pounds today 158 pounds Hold Cardizem, hold amiodarone tonight Start amlodipine 2.5 mg daily Increase losartan to 50 mg daily Restart amiodarone on 12/28  Ena Dawley, MD 12/15/2018   Ena Dawley, MD 12/15/2018

## 2018-12-15 NOTE — ED Triage Notes (Signed)
Pt arrives to ED from [facility] assisted living with complaints of shortness of breath starting two days ago. EMS reports pt has had a cough for weeks. Pt was 94% on room air. Pt placed in position of comfort with bed locked and lowered, call bell in reach.

## 2018-12-15 NOTE — H&P (Addendum)
[facility] Admission History and Physical Service Pager: [phone]  Patient name: Nicole Barker Medical record number: [id number] Date of birth: September 08, 1930 Age: 84 y.o. Gender: female  Primary Care Provider: Merlene Laughter, MD Consultants: Cardiology Code Status: DNR  Chief Complaint: Shortness of breath  Assessment and Plan: Nicole Barker is a 84 y.o. female presenting with increasing shortness of breath, found to have some lower extremity swelling and a BNP 800, concerning for heart failure exacerbation.  She was also found to be bradycardic to the 40s.  PMH is significant for paroxysmal atrial fibrillation, dementia without behavioral disturbance, hypertension, hyperlipidemia, and CKD.   SOB  possibly multi-factorial in setting of CHF vs bradycardia: Acute, worsening.  Pt and family report a few day history of increasing shortness of breath, associated with lower extremity edema and orthopnea. On arrival, found to be bradycardic to low of 41.  Physical exam remarkable for slightly decreased bibasilar breath sounds and trace to 1+ LE edema.  No increased WOB.  CXR without infiltrates or congestion. BNP 880, lowest recorded has been 650.  Wt 71.3Kg, wt 63.5 on 11/15. Echo in 10/2018 EF 45-50%, PA pressure 65 mmHg.  Shortness of breath is likely secondary to a mild acute on chronic heart failure and symptomatic bradycardia.  Bradycardia may be in the setting of polypharmacy with antiarrhythmics.  Concern for dietary indiscretions over the holidays contributing to fluid overload, up about 7 kg from ED visit on 11/15. Considered symptomatic anemia, however hemoglobin 11.5 at baseline. Unlikely pneumonia, no infiltrates on chest x-ray, afebrile, and without leukocytosis.  Unlikely ACS, POC troponin 0.01 without ischemic changes on EKG and patient without chest pain. - Admit to telemetry, attending Dr. Erin Hearing - Cardiology on board for further  evaluation of bradycardia, appreciate recommendations as below - Start Lasix IV 40 twice daily - Hold home Cardizem and amiodarone tonight, likely restart amiodarone on 12/28 - Strict I and Os - Daily weights  -Vitals per routine, monitor on telemetry - Monitor CBC, BMP, Mg: Replete electrolytes as appropriate  ? Pre-syncopal event: Acute. Daughter reports she was told patient "slumped over" while getting up earlier today.  This was at the patient's home, she did not witness this and unable to provide any additional information.  Likely secondary to bradycardia noted on presentation. Could also consider orthostatic hypotension, however patient has presented hypertensive. Low concern for precipitating neurological event, no focal neuro deficits noted on exam and no suggestive history. 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 EF 45-50% and diffuse hypokinesis on echo 10/2018.  BNP 880.  Likely in mild exacerbation as discussed in first assessment.  - Treatment for exacerbation as above - Holding home spironolactone and Lasix given IV therapy at this point - Continue management of BP  Paroxysmal atrial fibrillation: Chronic.  HR in 40s, baseline around 60s.  Appears to be in a regular rhythm on exam.  Takes diltiazem 240 mg, amiodarone 200 mg, and metoprolol 25 mg twice daily at home for control.  Recently has been in 10/2017 admitted with continued titration of these medications per cardiology. --Holding antiarrythmics in setting of bradycardia -Cardiology on board, appreciate recs as discussed above -Continue home anticoagulation with Eliquis 5 mg twice daily -Monitor telemetry  CKD 3a:  Cr slightly elevated at 1.3, however 1.36 on 12/30.  Baseline appears around 1.0-1.2.  Will monitor closely in the setting of diuretic therapy. -  Monitor BMP - Strict I and O - We will be  cautious with diuretic therapy  Hypertension: Chronic, uncontrolled. SBP 170s on admit. Takes several blood pressure lowering medications (however several for rate control/diuretic therapy) including losartan 25 mg, metoprolol 25 mg, Lasix 40 mg, spironolactone 25 mg, and diltiazem 240 mg at home.  - Cardiology on board, appreciate recommendations as below - Start amlodipine 2.5 mg daily per cards - Increase losartan to 50 mg daily  - Holding home Cardizem and metoprolol due to bradycardia - Holding home Lasix and spironolactone in the setting of IV therapy - Monitor BP  Dementia without behavioral disturbance: Chronic, stable. Oriented to self and place.  Repeats questions.  Resident of [facility].  -Monitor mental status  FEN/GI: heart healthy  Prophylaxis: Eliquis   Disposition: Admit to telemetry, attending Dr. Erin Hearing  History of Present Illness:  Nicole Barker is a 84 y.o. female, with a history of paroxysmal atrial fibrillation and combined heart failure, presenting with increasing shortness of breath and lower extremity swelling over the past few days.  Daughter is at bedside, reports the majority of history given patient with dementia.  Her daughter has noticed that she has been more short of breath while walking and patient endorses orthopnea.  Daughter also was unable to get her shoes on yesterday due to ankle swelling.  Patient had additional [place] meals on Christmas Eve and Christmas, however her daughter states she tried to keep everything low-sodium.  The patient lives at [facility] living facility, has her medications administered to her daily.  Denies any recent cough, sputum production, dizziness/lightheadedness, chest pain, abdominal pain, change in bowel movements, or dysuria.  Patient does have a history of bradycardia, however usually in the 60s.  Follows with cardiology for her antiarrhythmic agents.  Daughter believes her dry weight may be around 140-145 but is  unsure.  Of note in addition, the daughter states the living facility called her earlier and stated that she "slumped over "for a little bit when they were trying to stand her up.  No report of this in the ED note after signout from EMS.  Patient does not remember this happening, however history provided by patient is limited by her dementia.  ED Course: On arrival, patient was in no acute distress and found to be bradycardic to low 40s, otherwise vitals stable.  CXR showing cardiomegaly with aortic atherosclerosis and clearing of bibasilar pulmonary opacities.  BNP 880.  Troponin POC 0.01.  Other labs notable for glucose 105, creatinine 1.3, WBC 9.6, hemoglobin 11.5, platelets 236.  EKG junctional rhythm, bradycardia.  She received IV Lasix 20 mg in the ED.  Review Of Systems: Per HPI with the following additions:   Review of Systems  Constitutional: Negative for chills and fever.  HENT: Negative for sore throat.   Respiratory: Positive for shortness of breath. Negative for cough, hemoptysis, sputum production and wheezing.   Cardiovascular: Positive for orthopnea and leg swelling. Negative for chest pain and palpitations.  Gastrointestinal: Negative for abdominal pain, blood in stool, constipation, diarrhea, melena, nausea and vomiting.  Genitourinary: Negative for dysuria, frequency and urgency.  Neurological: Negative for dizziness and headaches.    Patient Active Problem List   Diagnosis Date Noted  . SOB (shortness of breath) 12/15/2018  . Bradycardia 12/15/2018  . Cardiomyopathy ([place]) 11/22/2018  . Chronic anticoagulation 11/22/2018  . Acute heart failure ([place]) 10/25/2018  . PAF (paroxysmal atrial fibrillation) ([place]) 10/25/2018  . Dementia without behavioral disturbance ([place]) 10/25/2018  .  Essential hypertension 10/25/2018  . Hyperlipidemia 10/25/2018  . AKI (acute kidney injury) ([place]) 10/25/2018    Past Medical History: Past Medical History:  Diagnosis Date  . Anxiety   .  Atrial fibrillation ([place])   . Dementia ([place])   . Hyperlipidemia   . Hypertension     Past Surgical History: Past Surgical History:  Procedure Laterality Date  . ABDOMINAL HYSTERECTOMY    . BACK SURGERY    . JOINT REPLACEMENT     knee    Social History: Social History   Tobacco Use  . Smoking status: Never Smoker  . Smokeless tobacco: Never Used  Substance Use Topics  . Alcohol use: Never    Frequency: Never  . Drug use: Never   Please also refer to relevant sections of EMR.  Family History: Family History  Problem Relation Age of Onset  . Hypertension Mother     Allergies and Medications: Allergies  Allergen Reactions  . Iodine Other (See Comments)    Unknown reaction   No current facility-administered medications on file prior to encounter.    Current Outpatient Medications on File Prior to Encounter  Medication Sig Dispense Refill  . acetaminophen (TYLENOL) 325 MG tablet Take 2 tablets (650 mg total) by mouth every 6 (six) hours as needed for mild pain or headache. 30 tablet 0  . albuterol (ACCUNEB) 0.63 MG/3ML nebulizer solution Take 1 ampule by nebulization every 8 (eight) hours as needed for shortness of breath.    Marland Kitchen amiodarone (PACERONE) 200 MG tablet Take 1 tablet (200 mg total) by mouth daily. 30 tablet 0  . apixaban (ELIQUIS) 5 MG TABS tablet Take 5 mg by mouth 2 (two) times daily.    Marland Kitchen diltiazem (CARDIZEM CD) 240 MG 24 hr capsule Take 1 capsule (240 mg total) by mouth daily. 30 capsule 0  . fluticasone (FLONASE) 50 MCG/ACT nasal spray Place 1 spray into both nostrils daily. 16 g 2  . gabapentin (NEURONTIN) 100 MG capsule Take 100 mg by mouth 2 (two) times daily.    Marland Kitchen losartan (COZAAR) 25 MG tablet Take 1 tablet (25 mg total) by mouth daily. 30 tablet 0  . metoprolol tartrate (LOPRESSOR) 25 MG tablet Take 1 tablet (25 mg total) by mouth 2 (two) times daily. 60 tablet 0  . Multiple Vitamin (MULTIVITAMIN WITH MINERALS) TABS tablet Take 1 tablet by mouth  daily. Centrum Silver    . polyethylene glycol (MIRALAX / GLYCOLAX) packet Take 17 g by mouth daily. 14 each 0  . senna-docusate (SENOKOT-S) 8.6-50 MG tablet Take 1 tablet by mouth at bedtime as needed for mild constipation. 30 tablet 0  . traMADol (ULTRAM) 50 MG tablet Take 1 tablet (50 mg total) by mouth every 12 (twelve) hours as needed for moderate pain. 10 tablet 0  . furosemide (LASIX) 40 MG tablet Take 1 tablet (40 mg total) by mouth daily. (Patient not taking: Reported on 12/15/2018) 30 tablet 0  . potassium chloride SA (K-DUR,KLOR-CON) 20 MEQ tablet Take 1 tablet (20 mEq total) by mouth daily. (Patient not taking: Reported on 12/15/2018) 30 tablet 4  . spironolactone (ALDACTONE) 25 MG tablet Take 0.5 tablets (12.5 mg total) by mouth daily. (Patient not taking: Reported on 12/15/2018) 30 tablet 0    Objective: BP (!) 188/77 (BP Location: Right Arm)   Pulse (!) 46   Temp 98.4 F (36.9 C) (Oral)   Resp 20   Ht 4\' 11"  (1.499 m)   Wt 71.3 kg Comment: scale C  SpO2 96%   BMI 31.75 kg/m  Exam: General: Alert, NAD,  HEENT: NCAT, MMM  Cardiac: Bradycardiac, appears regular, faint systolic murmur noted Lungs: Clear bilaterally with the exception of slightly decreased breath sounds bibasilar, no increased WOB on RA laying at a 30 degree incline Abdomen: soft, non-tender, non-distended, normoactive BS Msk: Moves all extremities spontaneously  Ext: Warm, dry, 2+ distal pulses, trace to +1 pitting edema Neuro: Alert, oriented to self and place.  Follows commands, speech appropriate.  No focal neuro deficits noted on exam. Derm: No bruising or rashes noted.  Labs and Imaging: CBC BMET  Recent Labs  Lab 12/15/18 1442  WBC 9.6  HGB 11.5*  HCT 36.6  PLT 236   Recent Labs  Lab 12/15/18 1442  NA 136  K 4.7  CL 108  CO2 20*  BUN 19  CREATININE 1.30*  GLUCOSE 105*  CALCIUM 8.3*     Dg Chest Port 1 View  Result Date: 12/15/2018 CLINICAL DATA:  Dyspnea starting 2 days ago  EXAM: PORTABLE CHEST 1 VIEW COMPARISON:  11/04/2018 FINDINGS: Stable cardiomegaly with aortic atherosclerosis. Clearing of bibasilar opacities from each lung base especially on left. Chronic bronchitic change of the lungs with bilateral upper lobe pulmonary hyperinflation and crowding of interstitial lower lung markings. IMPRESSION: Cardiomegaly with aortic atherosclerosis. Chronic bronchitic change of the lungs. Clearing of bibasilar pulmonary opacities. Electronically Signed   By: Ashley Royalty M.D.   On: 12/15/2018 16:20   The above note was written by the PGY1 intern physician. It was reviewed by myself. Changes or additions are made in [place].  Bonnita Hollow, MD 12/15/2018, 9:50 PM PGY-2, [facility] Intern pager: [phone], text pages welcome

## 2018-12-15 NOTE — ED Provider Notes (Signed)
[facility] EMERGENCY DEPARTMENT Provider Note   CSN: [id number] Arrival date & time: 12/15/18  1428      History   Chief Complaint Chief Complaint  Patient presents with  . Shortness of Breath    HPI Nicole Barker is a 84 y.o. female.  The history is provided by the patient, the EMS personnel and medical records. No language interpreter was used.  Shortness of Breath    Nicole Barker is a 84 y.o. female who presents to the Emergency Department complaining of sob. Level V caveat due to dementia. History is provided by EMS and the patient. She is a resident at [facility] assisted living and complains of shortness of breath. Patient states that shortness of breath started earlier today, per EMS that started a few days ago. She states that she has trouble breathing and feels like she needs to cough a lot. He denies any additional symptoms. She does have a history of atrial fibrillation and CHF. She denies any recent medication changes, but she was hospitalized in November and did have medication adjustments at that time. She is currently taking amiodarone 200 mg daily, diltiazem ER 240 mg daily, losartan 25 mg daily, eloquence 5 mg BID, metoprolol 25 mg BID, gabapentin 100 mg BID Past Medical History:  Diagnosis Date  . Anxiety   . Atrial fibrillation ([place])   . Dementia ([place])   . Hyperlipidemia   . Hypertension     Patient Active Problem List   Diagnosis Date Noted  . SOB (shortness of breath) 12/15/2018  . Bradycardia 12/15/2018  . Cardiomyopathy ([place]) 11/22/2018  . Chronic anticoagulation 11/22/2018  . Acute heart failure ([place]) 10/25/2018  . PAF (paroxysmal atrial fibrillation) ([place]) 10/25/2018  . Dementia without behavioral disturbance ([place]) 10/25/2018  . Essential hypertension 10/25/2018  . Hyperlipidemia 10/25/2018  . AKI (acute kidney injury) ([place]) 10/25/2018    Past Surgical History:  Procedure Laterality Date  . ABDOMINAL  HYSTERECTOMY    . BACK SURGERY    . JOINT REPLACEMENT     knee     OB History   No obstetric history on file.      Home Medications    Prior to Admission medications   Medication Sig Start Date End Date Taking? Authorizing Provider  acetaminophen (TYLENOL) 325 MG tablet Take 2 tablets (650 mg total) by mouth every 6 (six) hours as needed for mild pain or headache. 11/01/18  Yes Sheikh, Omair Latif, DO  albuterol (ACCUNEB) 0.63 MG/3ML nebulizer solution Take 1 ampule by nebulization every 8 (eight) hours as needed for shortness of breath.   Yes [provider]  amiodarone (PACERONE) 200 MG tablet Take 1 tablet (200 mg total) by mouth daily. 11/02/18  Yes Sheikh, Omair Latif, DO  apixaban (ELIQUIS) 5 MG TABS tablet Take 5 mg by mouth 2 (two) times daily.   Yes [provider]  diltiazem (CARDIZEM CD) 240 MG 24 hr capsule Take 1 capsule (240 mg total) by mouth daily. 11/02/18  Yes Sheikh, Omair Latif, DO  fluticasone (FLONASE) 50 MCG/ACT nasal spray Place 1 spray into both nostrils daily. 11/02/18  Yes Sheikh, Omair Latif, DO  gabapentin (NEURONTIN) 100 MG capsule Take 100 mg by mouth 2 (two) times daily.   Yes [provider]  losartan (COZAAR) 25 MG tablet Take 1 tablet (25 mg total) by mouth daily. 11/02/18  Yes Sheikh, Omair Latif, DO  metoprolol tartrate (LOPRESSOR) 25 MG tablet Take 1 tablet (25 mg total) by mouth 2 (two)  times daily. 11/01/18  Yes Sheikh, Omair Latif, DO  Multiple Vitamin (MULTIVITAMIN WITH MINERALS) TABS tablet Take 1 tablet by mouth daily. Centrum Silver   Yes [provider]  polyethylene glycol (MIRALAX / GLYCOLAX) packet Take 17 g by mouth daily. 11/01/18  Yes Sheikh, [place], DO  senna-docusate (SENOKOT-S) 8.6-50 MG tablet Take 1 tablet by mouth at bedtime as needed for mild constipation. 11/01/18  Yes Sheikh, Omair Latif, DO  traMADol (ULTRAM) 50 MG tablet Take 1 tablet (50 mg total) by mouth every 12 (twelve) hours as  needed for moderate pain. 11/01/18  Yes Sheikh, Omair Latif, DO  furosemide (LASIX) 40 MG tablet Take 1 tablet (40 mg total) by mouth daily. Patient not taking: Reported on 12/15/2018 11/02/18   Raiford Noble Latif, DO  potassium chloride SA (K-DUR,KLOR-CON) 20 MEQ tablet Take 1 tablet (20 mEq total) by mouth daily. Patient not taking: Reported on 12/15/2018 11/22/18   Erlene Quan, PA-C  spironolactone (ALDACTONE) 25 MG tablet Take 0.5 tablets (12.5 mg total) by mouth daily. Patient not taking: Reported on 12/15/2018 11/02/18   Kerney Elbe, DO    Family History Family History  Problem Relation Age of Onset  . Hypertension Mother     Social History Social History   Tobacco Use  . Smoking status: Never Smoker  . Smokeless tobacco: Never Used  Substance Use Topics  . Alcohol use: Never    Frequency: Never  . Drug use: Never     Allergies   Iodine   Review of Systems Review of Systems  Respiratory: Positive for shortness of breath.   All other systems reviewed and are negative.    Physical Exam Updated Vital Signs BP (!) 173/51 (BP Location: Left Arm)   Pulse 62   Temp 98.2 F (36.8 C) (Oral)   Resp 20   Ht 4\' 11"  (0.174 m)   Wt 71.3 kg Comment: scale C  SpO2 95%   BMI 31.75 kg/m   Physical Exam Vitals signs and nursing note reviewed.  Constitutional:      Appearance: She is well-developed.  HENT:     Head: Normocephalic and atraumatic.  Cardiovascular:     Rate and Rhythm: Regular rhythm. Bradycardia present.     Heart sounds: No murmur.  Pulmonary:     Effort: Pulmonary effort is normal. No respiratory distress.     Comments: Decreased air movement in bilateral bases Abdominal:     Palpations: Abdomen is soft.     Tenderness: There is no abdominal tenderness. There is no guarding or rebound.  Musculoskeletal:        General: No tenderness.     Comments: Nonpitting edema to BLE  Skin:    General: Skin is warm and dry.  Neurological:      Mental Status: She is alert.     Comments: Disoriented to time and recent events, oriented to "hospital,"  Generalized weakness  Psychiatric:        Behavior: Behavior normal.      ED Treatments / Results  Labs (all labs ordered are listed, but only abnormal results are displayed) Labs Reviewed  BASIC METABOLIC PANEL - Abnormal; Notable for the following components:      Result Value   CO2 20 (*)    Glucose, Bld 105 (*)    Creatinine, Ser 1.30 (*)    Calcium 8.3 (*)    GFR calc non Af Amer 37 (*)    GFR calc Af Amer 43 (*)  All other components within normal limits  CBC - Abnormal; Notable for the following components:   Hemoglobin 11.5 (*)    RDW 16.4 (*)    All other components within normal limits  BRAIN NATRIURETIC PEPTIDE - Abnormal; Notable for the following components:   B Natriuretic Peptide 880.8 (*)    All other components within normal limits  MRSA PCR SCREENING  BASIC METABOLIC PANEL  TSH  CBC WITH DIFFERENTIAL/PLATELET  I-STAT TROPONIN, ED    EKG EKG Interpretation  Date/Time:  Thursday December 15 2018 14:39:46 EST Ventricular Rate:  42 PR Interval:    QRS Duration: 87 QT Interval:  483 QTC Calculation: 404 R Axis:   40 Text Interpretation:  Junctional rhythm Anterior infarct, old Minimal ST depression, lateral leads Confirmed by Quintella Reichert [phone]) on 12/15/2018 3:35:29 PM   Radiology Dg Chest Port 1 View  Result Date: 12/15/2018 CLINICAL DATA:  Dyspnea starting 2 days ago EXAM: PORTABLE CHEST 1 VIEW COMPARISON:  11/04/2018 FINDINGS: Stable cardiomegaly with aortic atherosclerosis. Clearing of bibasilar opacities from each lung base especially on left. Chronic bronchitic change of the lungs with bilateral upper lobe pulmonary hyperinflation and crowding of interstitial lower lung markings. IMPRESSION: Cardiomegaly with aortic atherosclerosis. Chronic bronchitic change of the lungs. Clearing of bibasilar pulmonary opacities. Electronically  Signed   By: Ashley Royalty M.D.   On: 12/15/2018 16:20    Procedures Procedures (including critical care time)  Medications Ordered in ED Medications  sodium chloride flush (NS) 0.9 % injection 3 mL (3 mLs Intravenous Given 12/16/18 0019)  sodium chloride flush (NS) 0.9 % injection 3 mL (has no administration in time range)  0.9 %  sodium chloride infusion (has no administration in time range)  acetaminophen (TYLENOL) tablet 650 mg (has no administration in time range)  ondansetron (ZOFRAN) injection 4 mg (has no administration in time range)  polyethylene glycol (MIRALAX / GLYCOLAX) packet 17 g (has no administration in time range)  senna-docusate (Senokot-S) tablet 1 tablet (has no administration in time range)  apixaban (ELIQUIS) tablet 5 mg (5 mg Oral Given 12/16/18 0013)  gabapentin (NEURONTIN) capsule 100 mg (100 mg Oral Given 12/16/18 0013)  multivitamin with minerals tablet 1 tablet (1 tablet Oral Not Given 12/15/18 2002)  hydrALAZINE (APRESOLINE) injection 2 mg (has no administration in time range)  fluticasone (FLONASE) 50 MCG/ACT nasal spray 1 spray (has no administration in time range)  albuterol (PROVENTIL) (2.5 MG/3ML) 0.083% nebulizer solution 2.5 mg (has no administration in time range)  furosemide (LASIX) injection 40 mg (has no administration in time range)  losartan (COZAAR) tablet 50 mg (has no administration in time range)  amLODipine (NORVASC) tablet 2.5 mg (2.5 mg Oral Given 12/16/18 0011)  furosemide (LASIX) injection 20 mg (20 mg Intravenous Given 12/15/18 2001)  furosemide (LASIX) 10 MG/ML injection (  Duplicate 67/67/20 9470)  furosemide (LASIX) injection 20 mg (20 mg Intravenous Given 12/16/18 0016)     Initial Impression / Assessment and Plan / ED Course  I have reviewed the triage vital signs and the nursing notes.  Pertinent labs & imaging results that were available during my care of the patient were reviewed by me and considered in my medical decision  making (see chart for details).     Patient here for evaluation of shortness of breath, has a history of atrial fibrillation and CHF. She has experienced 10 pound weight gain over the last month. She is noted to be bradycardia in the emergency department, junctional escape versus atrial  fibrillation with slow ventricular response. Providing Lasix for diuresis in medicine consulted for admission for symptomatic dyspnea, bradycardia.  Final Clinical Impressions(s) / ED Diagnoses   Final diagnoses:  Shortness of breath  Bradycardia  Acute combined systolic and diastolic heart failure [facility])    ED Discharge Orders         Ordered    Amb Referral to HF Clinic     12/15/18 1812           Quintella Reichert, MD 12/16/18 0045

## 2018-12-16 ENCOUNTER — Encounter (HOSPITAL_COMMUNITY): Payer: Self-pay

## 2018-12-16 DIAGNOSIS — R0602 Shortness of breath: Secondary | ICD-10-CM

## 2018-12-16 DIAGNOSIS — R001 Bradycardia, unspecified: Principal | ICD-10-CM

## 2018-12-16 LAB — CBC WITH DIFFERENTIAL/PLATELET
Abs Immature Granulocytes: 0.04 10*3/uL (ref 0.00–0.07)
BASOS ABS: 0.1 10*3/uL (ref 0.0–0.1)
Basophils Relative: 1 %
Eosinophils Absolute: 0.3 10*3/uL (ref 0.0–0.5)
Eosinophils Relative: 3 %
HCT: 40 % (ref 36.0–46.0)
Hemoglobin: 13 g/dL (ref 12.0–15.0)
Immature Granulocytes: 1 %
Lymphocytes Relative: 16 %
Lymphs Abs: 1.3 10*3/uL (ref 0.7–4.0)
MCH: 28.1 pg (ref 26.0–34.0)
MCHC: 32.5 g/dL (ref 30.0–36.0)
MCV: 86.4 fL (ref 80.0–100.0)
Monocytes Absolute: 0.9 10*3/uL (ref 0.1–1.0)
Monocytes Relative: 10 %
NRBC: 0.2 % (ref 0.0–0.2)
Neutro Abs: 5.9 10*3/uL (ref 1.7–7.7)
Neutrophils Relative %: 69 %
Platelets: 196 10*3/uL (ref 150–400)
RBC: 4.63 MIL/uL (ref 3.87–5.11)
RDW: 16.1 % — AB (ref 11.5–15.5)
WBC: 8.5 10*3/uL (ref 4.0–10.5)

## 2018-12-16 LAB — BASIC METABOLIC PANEL
Anion gap: 12 (ref 5–15)
BUN: 18 mg/dL (ref 8–23)
CHLORIDE: 99 mmol/L (ref 98–111)
CO2: 27 mmol/L (ref 22–32)
Calcium: 9.3 mg/dL (ref 8.9–10.3)
Creatinine, Ser: 1.34 mg/dL — ABNORMAL HIGH (ref 0.44–1.00)
GFR calc Af Amer: 41 mL/min — ABNORMAL LOW (ref >60)
GFR calc non Af Amer: 36 mL/min — ABNORMAL LOW (ref >60)
Glucose, Bld: 88 mg/dL (ref 70–99)
Potassium: 3.4 mmol/L — ABNORMAL LOW (ref 3.5–5.1)
Sodium: 138 mmol/L (ref 135–145)

## 2018-12-16 LAB — MRSA PCR SCREENING: MRSA by PCR: POSITIVE — AB

## 2018-12-16 LAB — MAGNESIUM: Magnesium: 1.9 mg/dL (ref 1.7–2.4)

## 2018-12-16 LAB — TSH: TSH: 3.082 u[IU]/mL (ref 0.350–4.500)

## 2018-12-16 MED ORDER — FUROSEMIDE 40 MG PO TABS
40.0000 mg | ORAL_TABLET | Freq: Every day | ORAL | Status: DC
  Administered 2018-12-16 – 2018-12-17 (×2): 40 mg via ORAL
  Filled 2018-12-16 (×2): qty 1

## 2018-12-16 MED ORDER — CHLORHEXIDINE GLUCONATE CLOTH 2 % EX PADS
6.0000 | MEDICATED_PAD | Freq: Every day | CUTANEOUS | Status: DC
  Administered 2018-12-16 – 2018-12-17 (×2): 6 via TOPICAL

## 2018-12-16 MED ORDER — POTASSIUM CHLORIDE CRYS ER 20 MEQ PO TBCR
40.0000 meq | EXTENDED_RELEASE_TABLET | Freq: Two times a day (BID) | ORAL | Status: AC
  Administered 2018-12-16 (×2): 40 meq via ORAL
  Filled 2018-12-16 (×2): qty 2

## 2018-12-16 MED ORDER — FUROSEMIDE 40 MG PO TABS: 40.0000 mg | ORAL_TABLET | Freq: Two times a day (BID) | ORAL | Status: DC

## 2018-12-16 MED ORDER — MUPIROCIN 2 % EX OINT
1.0000 "application " | TOPICAL_OINTMENT | Freq: Two times a day (BID) | CUTANEOUS | Status: DC
  Administered 2018-12-16 – 2018-12-17 (×3): 1 via NASAL
  Filled 2018-12-16: qty 22

## 2018-12-16 MED ORDER — MAGNESIUM SULFATE 2 GM/50ML IV SOLN
2.0000 g | Freq: Once | INTRAVENOUS | Status: AC
  Administered 2018-12-16: 2 g via INTRAVENOUS
  Filled 2018-12-16: qty 50

## 2018-12-16 NOTE — Progress Notes (Addendum)
Family Medicine Teaching Service Daily Progress Note Intern Pager: [phone]  Patient name: Nicole Barker Medical record number: [id number] Date of birth: 13-Feb-1930 Age: 84 y.o. Gender: female  Primary Care Provider: Merlene Laughter, MD Consultants: Cards Code Status: DNR  Assessment and Plan: Nicole Barker is a 84 y.o. female presenting with increasing shortness of breath, found to have some lower extremity swelling and a BNP 800, concerning for heart failure exacerbation.  She was also found to be bradycardic to the 40s.  PMH is significant for paroxysmal atrial fibrillation, dementia without behavioral disturbance, hypertension, hyperlipidemia, and CKD.   Acute on chronic combined heart failure: Acute, improving. Denies any further SOB, DOE, orthopnea.  Trace lower extremity edema may need, lungs clear.  Weight 67 kg, 71.3 kg yesterday, likely related to change in scale. -3.5L down since admit.  Likely related to dietary discretions over the holidays, vs inappropriate outpatient diuretic regimen, in med reconciliation reports patient is apparently not taking Lasix 40 mg or the spironolactone 12.5 at home. -Cardiology on board; appreciate further recommendations -Transition to p.o. Lasix 40 twice daily -Strict I and O -Daily weights -Vitals per routine  Bradycardia, first-degree AV block: Acute, improving. HR around 50-60 this am, previously low 40's yesterday. EKG this am sinus with 1st degree AV block and PACs.  Mag 1.9, K3.4. Cardiology on board, holding her home Cardizem, metoprolol, and amiodarone currently.  Given long half-life of amiodarone, may consider holding this longer. -Cardiology on board, appreciate recommendations -Monitor on telemetry - Monitor electrolytes, replete as needed  Hypokalemia: Acute. K 3.4 this am, 4.7 yesterday. Likely in the setting of IV Lasix therapy. -Replete with K-Dur 40 X2 -Monitor BMP  ? Pre-syncopal event: Acute. No further  events since admit.  Likely secondary to bradycardia on admission.  Orthostatic vitals WNL.   - Cardiology on board for bradycardia as above - Monitor on telemetry  Combined diastolic/systolic heart failure: Acute on chronic, improving EF 45-50% and diffuse hypokinesis on echo 10/2018.  BNP 880.  Likely in mild exacerbation as discussed in first assessment.  - Treatment for exacerbation as above - Holding home spironolactone and Lasix given IV therapy at this point (although reported not taking)  - Continue management of BP  Paroxysmal atrial fibrillation: Chronic.  Currently in sinus rhythm, HR 50-60's. Takes diltiazem 240 mg, amiodarone 200 mg, and metoprolol 25 mg twice daily at home for control.  --Holding antiarrythmics in setting of bradycardia -Cardiology on board, appreciate recs as discussed above -Continue home anticoagulation with Eliquis 5 mg twice daily -Monitor telemetry  CKD 3a:  Cr 1.34, baseline 1.0-1.2. Will monitor closely in the setting of diuretic therapy. - Monitor BMP - Strict I and O - We will be cautious with diuretic therapy  Hypertension: Chronic, uncontrolled. SBP 155/51 this am. Takes several blood pressure lowering medications (however several for rate control/diuretic therapy) including losartan 25 mg, metoprolol 25 mg, Lasix 40 mg, spironolactone 25 mg, and diltiazem 240 mg at home.  - Cardiology on board, appreciate recommendations as below - Cont amlodipine 2.5 mg daily per cards - Increased losartan to 50 mg daily on 12/26 - Holding home Cardizem and metoprolol due to bradycardia - Holding home Lasix and spironolactone in the setting of IV therapy - Monitor BP  Dementia without behavioral disturbance: Chronic, stable. Oriented to self and place.  Repeats questions.  Resident of [place].  -Monitor mental status  FEN/GI: heart healthy  Prophylaxis: Eliquis   Disposition: Continue diuresis and monitoring of bradycardia, potential  discharge tomorrow  Subjective:  Doing well this morning, denies any further shortness of breath, orthopnea.  Denies any shortness of breath while walking around her room.  Objective: Temp:  [97.5 F (36.4 C)-98.4 F (36.9 C)] 98 F (36.7 C) (12/27 0429) Pulse Rate:  [41-62] 46 (12/27 0429) Resp:  [16-33] 20 (12/27 0429) BP: (144-188)/(46-77) 144/46 (12/27 0429) SpO2:  [93 %-98 %] 93 % (12/27 0429) Weight:  [67 kg-72.9 kg] 67 kg (12/27 0429) Physical Exam: General: Alert, NAD HEENT: NCAT, MMM Cardiac: Bradycardic, irregular, slight systolic murmur noted Lungs: Clear bilaterally, no increased WOB on RA Abdomen: soft, non-tender, non-distended, normoactive BS Msk: Moves all extremities spontaneously, stable gait without SOB while walking around the room Ext: Warm, dry, 2+ distal pulses, trace to +1 to mid calf bilaterally  Laboratory: Recent Labs  Lab 12/15/18 1442 12/16/18 0345  WBC 9.6 8.5  HGB 11.5* 13.0  HCT 36.6 40.0  PLT 236 196   Recent Labs  Lab 12/15/18 1442 12/16/18 0345  NA 136 138  K 4.7 3.4*  CL 108 99  CO2 20* 27  BUN 19 18  CREATININE 1.30* 1.34*  CALCIUM 8.3* 9.3  GLUCOSE 105* 88      Imaging/Diagnostic Tests: Dg Chest Port 1 View  Result Date: 12/15/2018 CLINICAL DATA:  Dyspnea starting 2 days ago EXAM: PORTABLE CHEST 1 VIEW COMPARISON:  11/04/2018 FINDINGS: Stable cardiomegaly with aortic atherosclerosis. Clearing of bibasilar opacities from each lung base especially on left. Chronic bronchitic change of the lungs with bilateral upper lobe pulmonary hyperinflation and crowding of interstitial lower lung markings. IMPRESSION: Cardiomegaly with aortic atherosclerosis. Chronic bronchitic change of the lungs. Clearing of bibasilar pulmonary opacities. Electronically Signed   By: Ashley Royalty M.D.   On: 12/15/2018 16:20    Patriciaann Clan, DO 12/16/2018, 7:37 AM PGY-1, [facility] Intern pager: [phone], text pages  welcome  Seen today by attending, Dr. Erin Hearing.  I discussed with the full resident team in rounds.  I agree with the documentation and management of Dr. Higinio Plan

## 2018-12-16 NOTE — Evaluation (Signed)
Physical Therapy Evaluation Patient Details Name: Nicole Barker MRN: [id number] DOB: [birth year] Today's Date: 12/16/2018   History of Present Illness  Pt was admitted with dyspnea. Dx of CHF, respiratory failure, AKI, UTI.  PMH:  A fib, heart failure, HTN and dementia  Clinical Impression  Pt admitted with above diagnosis. Pt currently with functional limitations due to the deficits listed below (see PT Problem List). Pt was able to ambulate with RW with overall good stability.  Feel pt is close to baseline and should do well at her A living.  Will follow acutely to challenge pt with balance activities while in hospital.  HHPT safety eval recommended.   Pt will benefit from skilled PT to increase their independence and safety with mobility to allow discharge to the venue listed below.      Follow Up Recommendations Home health PT;Supervision - Intermittent    Equipment Recommendations  None recommended by PT    Recommendations for Other Services       Precautions / Restrictions Precautions Precautions: None Restrictions Weight Bearing Restrictions: No      Mobility  Bed Mobility               General bed mobility comments: pt up in chair upon arrival  Transfers Overall transfer level: Needs assistance Equipment used: Rolling walker (2 wheeled) Transfers: Sit to/from Stand Sit to Stand: Supervision            Ambulation/Gait Ambulation/Gait assistance: Supervision Gait Distance (Feet): 150 Feet Assistive device: Rolling walker (2 wheeled) Gait Pattern/deviations: Step-through pattern;Decreased stride length   Gait velocity interpretation: 1.31 - 2.62 ft/sec, indicative of limited community ambulator General Gait Details: No LOB with RW.  Pt moiving well overall.  Pt feels she is at baseline with VSS wtih walk.   Stairs            Wheelchair Mobility    Modified [facility] (Stroke Patients Only)       Balance Overall balance assessment: Needs  assistance         Standing balance support: No upper extremity supported;During functional activity Standing balance-Leahy Scale: Fair Standing balance comment: NO LOB with min challenges to balance.              High level balance activites: Backward walking;Direction changes;Turns;Sudden stops;Head turns High Level Balance Comments: Supervision with challenges             Pertinent Vitals/Pain Pain Assessment: No/denies pain    Home Living Family/patient expects to be discharged to:: Assisted living               Home Equipment: Walker - 4 wheels;Grab bars - tub/shower;Shower seat - built in;Grab bars - toilet Additional Comments: from [facility]    Prior Function Level of Independence: Independent with assistive device(s)         Comments: per dtr pt walked 22mi/day PTA     Hand Dominance   Dominant Hand: Right    Extremity/Trunk Assessment   Upper Extremity Assessment Upper Extremity Assessment: Defer to OT evaluation    Lower Extremity Assessment Lower Extremity Assessment: Generalized weakness    Cervical / Trunk Assessment Cervical / Trunk Assessment: Normal  Communication   Communication: No difficulties  Cognition Arousal/Alertness: Awake/alert Behavior During Therapy: WFL for tasks assessed/performed Overall Cognitive Status: Within Functional Limits for tasks assessed  General Comments      Exercises General Exercises - Lower Extremity Ankle Circles/Pumps: AROM;Both;10 reps;Seated Long Arc Quad: AROM;Both;10 reps;Seated   Assessment/Plan    PT Assessment Patient needs continued PT services  PT Problem List Decreased activity tolerance;Decreased balance;Decreased mobility;Decreased knowledge of use of DME;Decreased safety awareness;Decreased knowledge of precautions;Cardiopulmonary status limiting activity       PT Treatment Interventions DME instruction;Gait  training;Functional mobility training;Therapeutic activities;Therapeutic exercise;Balance training;Patient/family education    PT Goals (Current goals can be found in the Care Plan section)  Acute Rehab PT Goals Patient Stated Goal: go home PT Goal Formulation: With patient Time For Goal Achievement: 12/30/18 Potential to Achieve Goals: Good    Frequency Min 3X/week   Barriers to discharge        Co-evaluation               AM-PAC PT "6 Clicks" Mobility  Outcome Measure Help needed turning from your back to your side while in a flat bed without using bedrails?: None Help needed moving from lying on your back to sitting on the side of a flat bed without using bedrails?: None Help needed moving to and from a bed to a chair (including a wheelchair)?: None Help needed standing up from a chair using your arms (e.g., wheelchair or bedside chair)?: None Help needed to walk in hospital room?: A Little Help needed climbing 3-5 steps with a railing? : A Little 6 Click Score: 22    End of Session Equipment Utilized During Treatment: Gait belt Activity Tolerance: Patient limited by fatigue Patient left: in chair;with call bell/phone within reach Nurse Communication: Mobility status PT Visit Diagnosis: Muscle weakness (generalized) (M62.81)    Time: 9211-9417 PT Time Calculation (min) (ACUTE ONLY): 14 min   Charges:   PT Evaluation $PT Eval Low Complexity: 1 Low          Zalia Hautala,PT Acute Rehabilitation Services Pager:  [phone]  Office:  [place] 12/16/2018, 1:12 PM

## 2018-12-16 NOTE — Evaluation (Addendum)
Occupational Therapy Evaluation Patient Details Name: Nicole Barker MRN: [id number] DOB: 13-Aug-1930 Today's Date: 12/16/2018    History of Present Illness Pt was admitted with dyspnea. Dx of CHF, respiratory failure, AKI, UTI.  PMH:  A fib, heart failure, HTN and dementia   Clinical Impression   Pt is able to function at sup - mod I level with ADLs/selfcare and sup with ADL mobility using RW. PTA, pt lived at [facility] and used a RW for mobility; reports that she required no physical assist for ADLs. All education completed and no further acute OT is indicated at this time    Follow Up Recommendations  No OT follow up;Supervision/Assistance - 24 hour    Equipment Recommendations  None recommended by OT    Recommendations for Other Services  PT consult     Precautions / Restrictions Precautions Precautions: None Restrictions Weight Bearing Restrictions: No      Mobility Bed Mobility               General bed mobility comments: pt up in chair upon OT arrival  Transfers Overall transfer level: Needs assistance Equipment used: Rolling walker (2 wheeled) Transfers: Sit to/from Stand Sit to Stand: Supervision              Balance Overall balance assessment: No apparent balance deficits (not formally assessed)                                         ADL either performed or assessed with clinical judgement   ADL Overall ADL's : At baseline;Modified independent                                       General ADL Comments: Mod I - sup     Vision Baseline Vision/History: Wears glasses Wears Glasses: Reading only Patient Visual Report: No change from baseline       Perception     Praxis      Pertinent Vitals/Pain Pain Assessment: No/denies pain     Hand Dominance Right   Extremity/Trunk Assessment Upper Extremity Assessment Upper Extremity Assessment: Overall WFL for tasks assessed   Lower Extremity  Assessment Lower Extremity Assessment: Defer to PT evaluation       Communication Communication Communication: No difficulties   Cognition Arousal/Alertness: Awake/alert Behavior During Therapy: WFL for tasks assessed/performed Overall Cognitive Status: Within Functional Limits for tasks assessed                                     General Comments       Exercises     Shoulder Instructions      Home Living Family/patient expects to be discharged to:: Assisted living                   Bathroom Shower/Tub: Walk-in shower   Bathroom Toilet: Handicapped height     Home Equipment: Environmental consultant - 4 wheels;Grab bars - tub/shower;Shower seat - built in;Grab bars - toilet   Additional Comments: from [place]      Prior Functioning/Environment Level of Independence: Independent with assistive device(s)                 OT Problem List: Decreased activity tolerance  OT Treatment/Interventions:      OT Goals(Current goals can be found in the care plan section) Acute Rehab OT Goals Patient Stated Goal: go home OT Goal Formulation: With patient  OT Frequency:     Barriers to D/C:    no barriers, lives at ALF       Co-evaluation              AM-PAC OT "6 Clicks" Daily Activity     Outcome Measure Help from another person eating meals?: None Help from another person taking care of personal grooming?: None Help from another person toileting, which includes using toliet, bedpan, or urinal?: None Help from another person bathing (including washing, rinsing, drying)?: None Help from another person to put on and taking off regular upper body clothing?: None Help from another person to put on and taking off regular lower body clothing?: None(sup) 6 Click Score: 24   End of Session Equipment Utilized During Treatment: Gait belt;Rolling walker  Activity Tolerance: Patient tolerated treatment well Patient left: in chair;with call bell/phone  within reach  OT Visit Diagnosis: Other (comment)(Low endurance)                Time: 3267-1245 OT Time Calculation (min): 28 min Charges:  OT General Charges $OT Visit: 1 Visit OT Evaluation $OT Eval Moderate Complexity: 1 Mod OT Treatments $Therapeutic Activity: 8-22 mins    Britt Bottom 12/16/2018, 12:39 PM

## 2018-12-16 NOTE — Progress Notes (Signed)
Pt had ortho done earlier at 1624; lying:164/63, 46; sitting 177/36, 41; 190/59, 46, will continue to monitor, Thanks [facility] [place] .

## 2018-12-16 NOTE — Progress Notes (Signed)
Progress Note  Patient Name: Nicole Barker Date of Encounter: 12/16/2018  Primary Cardiologist: Kirk Ruths, MD   Subjective   She feels significantly better today, improved SOB, no dizziness.  Inpatient Medications    Scheduled Meds: . amLODipine  2.5 mg Oral Daily  . apixaban  5 mg Oral BID  . Chlorhexidine Gluconate Cloth  6 each Topical Q0600  . fluticasone  1 spray Each Nare Daily  . furosemide  40 mg Oral BID  . gabapentin  100 mg Oral BID  . losartan  50 mg Oral Daily  . multivitamin with minerals  1 tablet Oral Daily  . mupirocin ointment  1 application Nasal BID  . polyethylene glycol  17 g Oral Daily  . potassium chloride  40 mEq Oral BID  . sodium chloride flush  3 mL Intravenous Q12H   Continuous Infusions: . sodium chloride     PRN Meds: sodium chloride, acetaminophen, albuterol, ondansetron (ZOFRAN) IV, senna-docusate, sodium chloride flush   Vital Signs    Vitals:   12/16/18 0004 12/16/18 0429 12/16/18 0835 12/16/18 1219  BP: (!) 173/51 (!) 144/46 (!) 155/51 125/69  Pulse: 62 (!) 46 (!) 50 (!) 46  Resp: 20 20 19 18   Temp: 98.2 F (36.8 C) 98 F (36.7 C) 97.7 F (36.5 C) 98 F (36.7 C)  TempSrc: Oral Oral Oral Oral  SpO2: 95% 93% 97% 98%  Weight:  67 kg    Height:        Intake/Output Summary (Last 24 hours) at 12/16/2018 1241 Last data filed at 12/16/2018 0941 Gross per 24 hour  Intake 460 ml  Output 3800 ml  Net -3340 ml   Filed Weights   12/15/18 1743 12/15/18 1853 12/16/18 0429  Weight: 72.9 kg 71.3 kg 67 kg    Telemetry    SB with ventricular rates 50-60' - Personally Reviewed  ECG    SB, 50 BPM, 1.AVB - Personally Reviewed  Physical Exam   GEN: No acute distress.   Neck: No JVD Cardiac: RRR, no murmurs, rubs, or gallops.  Respiratory: Clear to auscultation bilaterally. GI: Soft, nontender, non-distended  MS: No edema; No deformity. Neuro:  Nonfocal  Psych: Normal affect   Labs    Chemistry Recent  Labs  Lab 12/15/18 1442 12/16/18 0345  NA 136 138  K 4.7 3.4*  CL 108 99  CO2 20* 27  GLUCOSE 105* 88  BUN 19 18  CREATININE 1.30* 1.34*  CALCIUM 8.3* 9.3  GFRNONAA 37* 36*  GFRAA 43* 41*  ANIONGAP 8 12     Hematology Recent Labs  Lab 12/15/18 1442 12/16/18 0345  WBC 9.6 8.5  RBC 4.02 4.63  HGB 11.5* 13.0  HCT 36.6 40.0  MCV 91.0 86.4  MCH 28.6 28.1  MCHC 31.4 32.5  RDW 16.4* 16.1*  PLT 236 196    Cardiac EnzymesNo results for input(s): TROPONINI in the last 168 hours.  Recent Labs  Lab 12/15/18 1447  TROPIPOC 0.01     BNP Recent Labs  Lab 12/15/18 1611  BNP 880.8*     DDimer No results for input(s): DDIMER in the last 168 hours.   Radiology    Dg Chest Port 1 View  Result Date: 12/15/2018 CLINICAL DATA:  Dyspnea starting 2 days ago EXAM: PORTABLE CHEST 1 VIEW COMPARISON:  11/04/2018 FINDINGS: Stable cardiomegaly with aortic atherosclerosis. Clearing of bibasilar opacities from each lung base especially on left. Chronic bronchitic change of the lungs with bilateral upper lobe pulmonary hyperinflation  and crowding of interstitial lower lung markings. IMPRESSION: Cardiomegaly with aortic atherosclerosis. Chronic bronchitic change of the lungs. Clearing of bibasilar pulmonary opacities. Electronically Signed   By: Ashley Royalty M.D.   On: 12/15/2018 16:20    Cardiac Studies     Patient Profile     84 y.o. female   Assessment & Plan    Acute on chronic combined systolic diastolic CHF Paroxysmal atrial fibrillation Junctional bradycardia in the settings of amiodarone and Cardizem use Hypertension  Plan: Started on Lasix IV 40 twice daily, negative 3.3 L overnight, now weight back to baseline 148 lbs, hold lasix today, start lasix 40 mg po daily tomorrow Replace K and Mg Crea is stable Strict I's and O's, baseline weight 150, admission 158 pounds, today 148 lbs Hold Cardizem, continue holding amiodarone, continue telemetry, the patient is still  bradycardic Started on amlodipine 2.5 mg daily and Increased losartan to 50 mg daily Restart amiodarone once HR > 60 BMP  For questions or updates, please contact [facility] Please consult [email or web address] for contact info under        Signed, Ena Dawley, MD  12/16/2018, 12:41 PM

## 2018-12-17 DIAGNOSIS — I495 Sick sinus syndrome: Secondary | ICD-10-CM

## 2018-12-17 DIAGNOSIS — N183 Chronic kidney disease, stage 3 (moderate): Secondary | ICD-10-CM

## 2018-12-17 DIAGNOSIS — I5041 Acute combined systolic (congestive) and diastolic (congestive) heart failure: Secondary | ICD-10-CM

## 2018-12-17 DIAGNOSIS — I480 Paroxysmal atrial fibrillation: Secondary | ICD-10-CM

## 2018-12-17 LAB — CBC WITH DIFFERENTIAL/PLATELET
Abs Immature Granulocytes: 0.02 10*3/uL (ref 0.00–0.07)
BASOS ABS: 0.1 10*3/uL (ref 0.0–0.1)
Basophils Relative: 1 %
Eosinophils Absolute: 0.3 10*3/uL (ref 0.0–0.5)
Eosinophils Relative: 3 %
HCT: 38.3 % (ref 36.0–46.0)
Hemoglobin: 12.3 g/dL (ref 12.0–15.0)
Immature Granulocytes: 0 %
Lymphocytes Relative: 19 %
Lymphs Abs: 1.4 10*3/uL (ref 0.7–4.0)
MCH: 27.8 pg (ref 26.0–34.0)
MCHC: 32.1 g/dL (ref 30.0–36.0)
MCV: 86.7 fL (ref 80.0–100.0)
Monocytes Absolute: 0.9 10*3/uL (ref 0.1–1.0)
Monocytes Relative: 12 %
NEUTROS ABS: 5.1 10*3/uL (ref 1.7–7.7)
Neutrophils Relative %: 65 %
PLATELETS: 187 10*3/uL (ref 150–400)
RBC: 4.42 MIL/uL (ref 3.87–5.11)
RDW: 16 % — ABNORMAL HIGH (ref 11.5–15.5)
WBC: 7.8 10*3/uL (ref 4.0–10.5)
nRBC: 0 % (ref 0.0–0.2)

## 2018-12-17 LAB — BASIC METABOLIC PANEL
ANION GAP: 10 (ref 5–15)
BUN: 24 mg/dL — ABNORMAL HIGH (ref 8–23)
CO2: 27 mmol/L (ref 22–32)
Calcium: 9.3 mg/dL (ref 8.9–10.3)
Chloride: 97 mmol/L — ABNORMAL LOW (ref 98–111)
Creatinine, Ser: 1.42 mg/dL — ABNORMAL HIGH (ref 0.44–1.00)
GFR calc Af Amer: 39 mL/min — ABNORMAL LOW (ref >60)
GFR calc non Af Amer: 33 mL/min — ABNORMAL LOW (ref >60)
Glucose, Bld: 94 mg/dL (ref 70–99)
POTASSIUM: 4.4 mmol/L (ref 3.5–5.1)
Sodium: 134 mmol/L — ABNORMAL LOW (ref 135–145)

## 2018-12-17 LAB — MAGNESIUM: Magnesium: 2.1 mg/dL (ref 1.7–2.4)

## 2018-12-17 MED ORDER — AMLODIPINE BESYLATE 2.5 MG PO TABS: 2.5000 mg | ORAL_TABLET | Freq: Every day | ORAL | 0 refills | Status: DC

## 2018-12-17 NOTE — Discharge Summary (Signed)
[facility] Discharge Summary  Patient name: Nicole Barker Medical record number: [id number] Date of birth: 1930-12-05 Age: 84 y.o. Gender: female Date of Admission: 12/15/2018  Date of Discharge: 12/17/2018 Admitting Physician: Patriciaann Clan, DO  Primary Care Provider: Merlene Laughter, MD Consultants: Cardiology  Indication for Hospitalization: CHF exacerbation, Iatrogenic bradycardia  Discharge Diagnoses/Problem List:  Patient Active Problem List   Diagnosis Date Noted  . SOB (shortness of breath) 12/15/2018  . Bradycardia 12/15/2018  . Cardiomyopathy ([place]) 11/22/2018  . Chronic anticoagulation 11/22/2018  . Acute heart failure ([place]) 10/25/2018  . PAF (paroxysmal atrial fibrillation) ([place]) 10/25/2018  . Dementia without behavioral disturbance ([employer]) 10/25/2018  . Essential hypertension 10/25/2018  . Hyperlipidemia 10/25/2018  . AKI (acute kidney injury) ([place]) 10/25/2018     Disposition: Home  Discharge Condition: Improved and stable  Discharge Exam:  Temp:  [97.5 F (36.4 C)-98 F (36.7 C)] 97.5 F (36.4 C) (12/28 0634) Pulse Rate:  [46-63] 51 (12/28 0634) Resp:  [18-19] 18 (12/28 0634) BP: (105-155)/(51-78) 105/78 (12/28 0634) SpO2:  [93 %-98 %] 96 % (12/28 0634) Weight:  [68.1 kg] 68.1 kg (12/28 0500) Physical Exam: General: Alert, NAD, conversant Cardiac: Bradycardic, irregularly irregular Lungs:CTA bil, comfortable work of breathing Abdomen: soft, non-tender, non-distended, normoactive BS Msk: moves 4 extremities equally, no gross deformity Ext: Warm, trace to +1 to mid calf bilaterally  Brief Hospital Course:  Patient is an 84yo female who presented with LE edema thought to be due to hypervolemia in the setting of CHF exacerbation. BNP was 880. She was also noted to be bradycardic with a heart rate in the 40s.  Bradycardia - thought to be iatrogenic. Diltiazem, amiodarone and metoprolol were held on admission.  Bradycardia persisted which may be expected with long half life of amiodarone. Cardiology was consulted and will follow up outpatient. Recommendations included starting norvasc for BP control in the setting of held antiarrhythmic medications. Cardiology also suggested restarting amiodarone at 200 mg daily however given persistent bradycardia in the 40s while sleeping, our team elected to hold this medication at discharge.  Hypervolemia/Edema - patient presented with dyspnea and LE edema. BNP was elevated at 880. CXR negative for acute findings. Recent echo in 10/2018 suggests 45-50% EF, diffuse hypokinesis. She was diuresed with IV lasix to dry weight and transitioned to PO lasix. Hypervolemia appeared to be resolved at the time of discharge.  AKI likely prerenal in the setting of diuresis. Nephrotoxic agents held on admission. Cardiology recommended increasing losartan to 50 mg daily however in the setting of AKI this change was deferred to outpatient follow up. Will need BMP rechecked at follow up.  Patient DC'd to care of her daughter. PT evaluated, no follow up recommended.  Issues for Follow Up:  1. Recheck BMP for renal function. AKI with Cr 1.42 from BL 1.0-1.2 at the time of DC.  2. Consider titrating up losartan to 50 mg if required for BP control. Was kept at the lower 25 mg dose due to mild AKI at time of DC. 3. Did not order amiodarone at DC due to bradycardia still to the 40s the night prior to discharge. Also held diltiazem and metoprolol. 4. Norvasc 2.5 mg was started for BP control 5. Spironolactone was continued. Due to normal potassium on admission with spironolactone and lasix on board, patient was not thought to require potassium supplementation  Significant Procedures: none  Significant Labs and Imaging:  Recent Labs  Lab 12/15/18 1442 12/16/18 0345 12/17/18 0424  WBC 9.6 8.5 7.8  HGB 11.5* 13.0 12.3  HCT 36.6 40.0 38.3  PLT 236 196 187   Recent Labs  Lab  12/15/18 1442 12/16/18 0345 12/17/18 0424  NA 136 138 134*  K 4.7 3.4* 4.4  CL 108 99 97*  CO2 20* 27 27  GLUCOSE 105* 88 94  BUN 19 18 24*  CREATININE 1.30* 1.34* 1.42*  CALCIUM 8.3* 9.3 9.3  MG  --  1.9 2.1      Results/Tests Pending at Time of Discharge: amiodarone level  Discharge Medications:  Allergies as of 12/17/2018      Reactions   Iodine Other (See Comments)   Unknown reaction      Medication List    STOP taking these medications   amiodarone 200 MG tablet Commonly known as:  PACERONE   diltiazem 240 MG 24 hr capsule Commonly known as:  CARDIZEM CD   metoprolol tartrate 25 MG tablet Commonly known as:  LOPRESSOR   potassium chloride SA 20 MEQ tablet Commonly known as:  K-DUR,KLOR-CON   traMADol 50 MG tablet Commonly known as:  ULTRAM     TAKE these medications   acetaminophen 325 MG tablet Commonly known as:  TYLENOL Take 2 tablets (650 mg total) by mouth every 6 (six) hours as needed for mild pain or headache.   albuterol 0.63 MG/3ML nebulizer solution Commonly known as:  ACCUNEB Take 1 ampule by nebulization every 8 (eight) hours as needed for shortness of breath.   amLODipine 2.5 MG tablet Commonly known as:  NORVASC Take 1 tablet (2.5 mg total) by mouth daily. Start taking on:  December 18, 2018   ELIQUIS 5 MG Tabs tablet Generic drug:  apixaban Take 5 mg by mouth 2 (two) times daily.   fluticasone 50 MCG/ACT nasal spray Commonly known as:  FLONASE Place 1 spray into both nostrils daily.   furosemide 40 MG tablet Commonly known as:  LASIX Take 1 tablet (40 mg total) by mouth daily.   gabapentin 100 MG capsule Commonly known as:  NEURONTIN Take 100 mg by mouth 2 (two) times daily.   losartan 25 MG tablet Commonly known as:  COZAAR Take 1 tablet (25 mg total) by mouth daily.   multivitamin with minerals Tabs tablet Take 1 tablet by mouth daily. Centrum Silver   polyethylene glycol packet Commonly known as:  MIRALAX /  GLYCOLAX Take 17 g by mouth daily.   senna-docusate 8.6-50 MG tablet Commonly known as:  Senokot-S Take 1 tablet by mouth at bedtime as needed for mild constipation.   spironolactone 25 MG tablet Commonly known as:  ALDACTONE Take 0.5 tablets (12.5 mg total) by mouth daily.       Discharge Instructions: Please refer to Patient Instructions section of EMR for full details.  Patient was counseled important signs and symptoms that should prompt return to medical care, changes in medications, dietary instructions, activity restrictions, and follow up appointments.   Follow-Up Appointments: Follow-up Information    Lelon Perla, MD Follow up.   Specialty:  Cardiology Why:  We will arrange for a follow-up appt with Dr. Stanford Breed or one of his NP/PA's and contact you within the next week. Contact information: [address] [place] [address] [phone]        Merlene Laughter, MD. Schedule an appointment as soon as possible for a visit.   Specialty:  Internal Medicine Contact information: [address] [place] [address] [phone]  Everrett Coombe, MD 12/17/2018, 5:13 PM PGY-3, [facility]\

## 2018-12-17 NOTE — Progress Notes (Signed)
Report given to [facility] med tech because they are no nurses on weekends. All questions answered and concerns addressed.

## 2018-12-17 NOTE — Progress Notes (Signed)
Progress Note  Patient Name: Nicole Barker Date of Encounter: 12/17/2018  Primary Cardiologist: Kirk Ruths, MD   Subjective   Feeling much better today.  Additional 1.2 L net diuresis, 4.6 L since admission. Weight stable at her estimated dry weight of 150 pounds, 7-10 pounds less than on admission. Potassium repleted.  Creatinine essentially unchanged at 1.42. Alternating sinus bradycardia and junctional rhythm on the monitor.  More frequent P waves are seen. Fair blood pressure control.  Inpatient Medications    Scheduled Meds: . amLODipine  2.5 mg Oral Daily  . apixaban  5 mg Oral BID  . Chlorhexidine Gluconate Cloth  6 each Topical Q0600  . fluticasone  1 spray Each Nare Daily  . furosemide  40 mg Oral Daily  . gabapentin  100 mg Oral BID  . losartan  50 mg Oral Daily  . multivitamin with minerals  1 tablet Oral Daily  . mupirocin ointment  1 application Nasal BID  . polyethylene glycol  17 g Oral Daily  . sodium chloride flush  3 mL Intravenous Q12H   Continuous Infusions: . sodium chloride 250 mL (12/16/18 1345)   PRN Meds: sodium chloride, acetaminophen, albuterol, ondansetron (ZOFRAN) IV, senna-docusate, sodium chloride flush   Vital Signs    Vitals:   12/16/18 2042 12/17/18 0056 12/17/18 0500 12/17/18 0634  BP: 140/67   105/78  Pulse: 63   (!) 51  Resp: 18   18  Temp: 97.9 F (36.6 C)   (!) 97.5 F (36.4 C)  TempSrc: Oral   Oral  SpO2: 93%   96%  Weight:  68.1 kg 68.1 kg   Height:        Intake/Output Summary (Last 24 hours) at 12/17/2018 0837 Last data filed at 12/17/2018 0500 Gross per 24 hour  Intake 650 ml  Output 1700 ml  Net -1050 ml   Filed Weights   12/16/18 0429 12/17/18 0056 12/17/18 0500  Weight: 67 kg 68.1 kg 68.1 kg    Telemetry    Sinus bradycardia and junctional escape beats, minimum 49 bpm, currently in the mid 50s.- Personally Reviewed  ECG    No new tracing- Personally Reviewed  Physical Exam  Looks very  comfortable, smiling GEN: No acute distress.   Neck: No JVD Cardiac:  Irregular, no murmurs, rubs, or gallops.  Respiratory: Clear to auscultation bilaterally. GI: Soft, nontender, non-distended  MS: No edema; No deformity. Neuro:  Nonfocal  Psych: Normal affect   Labs    Chemistry Recent Labs  Lab 12/15/18 1442 12/16/18 0345 12/17/18 0424  NA 136 138 134*  K 4.7 3.4* 4.4  CL 108 99 97*  CO2 20* 27 27  GLUCOSE 105* 88 94  BUN 19 18 24*  CREATININE 1.30* 1.34* 1.42*  CALCIUM 8.3* 9.3 9.3  GFRNONAA 37* 36* 33*  GFRAA 43* 41* 39*  ANIONGAP _0 Hematology Recent Labs  Lab 12/15/18 1442 12/16/18 0345 12/17/18 0424  WBC 9.6 8.5 7.8  RBC 4.02 4.63 4.42  HGB 11.5* 13.0 12.3  HCT 36.6 40.0 38.3  MCV 91.0 86.4 86.7  MCH 28.6 28.1 27.8  MCHC 31.4 32.5 32.1  RDW 16.4* 16.1* 16.0*  PLT 236 196 187    Cardiac EnzymesNo results for input(s): TROPONINI in the last 168 hours.  Recent Labs  Lab 12/15/18 1447  TROPIPOC 0.01     BNP Recent Labs  Lab 12/15/18 1611  BNP 880.8*     DDimer No results for  input(s): DDIMER in the last 168 hours.   Radiology    Dg Chest Port 1 View  Result Date: 12/15/2018 CLINICAL DATA:  Dyspnea starting 2 days ago EXAM: PORTABLE CHEST 1 VIEW COMPARISON:  11/04/2018 FINDINGS: Stable cardiomegaly with aortic atherosclerosis. Clearing of bibasilar opacities from each lung base especially on left. Chronic bronchitic change of the lungs with bilateral upper lobe pulmonary hyperinflation and crowding of interstitial lower lung markings. IMPRESSION: Cardiomegaly with aortic atherosclerosis. Chronic bronchitic change of the lungs. Clearing of bibasilar pulmonary opacities. Electronically Signed   By: Ashley Royalty M.D.   On: 12/15/2018 16:20    Cardiac Studies   ECHO: 10/25/2018 - Left ventricle: The cavity size was normal. Wall thickness was increased in a pattern of mild LVH. Systolic function was mildly reduced. The estimated  ejection fraction was in the range of 45% to 50%. Diffuse hypokinesis. Doppler parameters are consistent with high ventricular filling pressure. - Aortic valve: Valve mobility was restricted. There was mild stenosis. There was mild regurgitation. - Mitral valve: Calcified annulus. The findings are consistent with mild stenosis. There was moderate regurgitation. - Left atrium: The atrium was mildly dilated. - Tricuspid valve: There was moderate regurgitation. - Pulmonary arteries: Systolic pressure was moderately to severely increased. PA peak pressure: 65 mm Hg (S). - Pericardium, extracardiac: There was a left pleural effusion.  Impressions:  - Mild global reduction in LV systolic function; mild LVH; calcified aortic valve with mild AS (mean gradient 15 mmHg) and mild AI; MAC with mild MS (mean gradient 5 mmHg); moderate MR; mild LAE; moderate TR; moderate to severe pulmonary hypertension.  Patient Profile     84 y.o. female with acute exacerbation of heart failure with mildly depressed left ventricular systolic function, history of paroxysmal atrial fibrillation, moderate pulmonary hypertension, mild degenerative aortic stenosis and mitral stenosis, mitral insufficiency, presenting with antibiotic junctional bradycardia while on treatment with diltiazem and amiodarone.  Assessment & Plan    1. CHF: At her estimated dry weight.  Stable on oral diuretics.  Restart spironolactone.  Based on potassium 4.7 on admission, she probably does not require any additional potassium supplements. 2.  Bradycardia due to drugs: Diltiazem and metoprolol stopped and will not be restarted.  Restart amiodarone 200 mg daily. 3. Parox AFib: No recurrence during this admission.  On apixaban. CHADSVasc 6 (age 49, gender, hypertension, heart failure). 4. HTN: Losartan increased and amlodipine started to compensate for discontinuation of diltiazem.  [facility] will sign off.   Medication  Recommendations: Amiodarone 200 mg daily, apixaban 5 mg twice daily, amlodipine 2.5 mg once daily, furosemide 40 mg daily, losartan 50 mg daily, spironolactone 12.5 mg daily.   Other recommendations (labs, testing, etc): We will check a basic metabolic panel at her follow-up visit in 2 weeks. Follow up as an outpatient: 2 week transition of care appointment.  We will make arrangements.    For questions or updates, please contact [place] Please consult [email or web address] for contact info under        Signed, Sanda Klein, MD  12/17/2018, 8:37 AM

## 2018-12-17 NOTE — Progress Notes (Signed)
Family Medicine Teaching Service Daily Progress Note Intern Pager: [phone]  Patient name: Nicole Barker Medical record number: [id number] Date of birth: 1930/12/03 Age: 84 y.o. Gender: female  Primary Care Provider: Merlene Laughter, MD Consultants: Cards Code Status: DNR  Assessment and Plan: Nicole Barker is a 84 y.o. female presenting with increasing shortness of breath, found to have some lower extremity swelling and a BNP 800, concerning for heart failure exacerbation.  She was also found to be bradycardic to the 40s.  PMH is significant for paroxysmal atrial fibrillation, dementia without behavioral disturbance, hypertension, hyperlipidemia, and CKD.   Acute on chronic combined heart failure: Acute, improving. 2/2 dietary discretions over holidays vs missing medications outpt. Asymptomatic. Weight stable over last 24H. I/O net negative -4.6L since admit. Transitioned to PO Lasix 12/27. - holding home spironolactone -Cardiology on board; appreciate further recommendations -Strict I and O -Daily weights -Vitals per routine - norvasc 2.5 mg daily restarted per cardiology recs. Losartan titrated up to 50 mg daily, however in setting of AKI and well-controlled BP today, would keep at 25 mg until follow up, plan to recheck BMP at /u  Bradycardia, first-degree AV block: Acute, improving.  HR around 46-63 overnight. EKG this am sinus with 1st degree AV block and PACs. Cardiology on board, recommend to restart amiodarone once HR >60. Holding cardizem and metoprolol.  - monitor on telemetry - replete electrolytes as needed - appreciate cards recs  Hypokalemia: Acute. Resolved after repletion yesterday. - monitor on BMP  ? Pre-syncopal event: Acute. No further events since admit.  Likely secondary to bradycardia on admission.  Orthostatic vitals WNL.   - Cardiology on board for bradycardia as above - Monitor on telemetry  Paroxysmal atrial fibrillation: Chronic.   Currently in sinus rhythm, HR 50-60's. Takes diltiazem 240 mg, amiodarone 200 mg, and metoprolol 25 mg twice daily at home for control. Antiarrhythmic management in setting of bradycardia is discussed above. -Continue home anticoagulation with Eliquis 5 mg BID  CKD 3a:  Cr 1.34 >> 1.42 this AM, baseline 1.0-1.2. Will monitor closely in the setting of diuretic therapy. - Monitor BMP - Strict I and O - We will be cautious with diuretic therapy - held losartan in setting of worsening AKI  Hypertension: Chronic, uncontrolled. SBP well-controlled this am. Takes several blood pressure-lowering medications at home including losartan 25 mg, metoprolol 25 mg, Lasix 40 mg, spironolactone 25 mg, and diltiazem 240 mg at home.  - Cardiology on board, appreciate recommendations as below - Cont amlodipine 2.5 mg daily per cards - Increased losartan to 50 mg daily on 12/26> held 12/27 for AKI - Holding home Cardizem and metoprolol due to bradycardia - Holding home Lasix and spironolactone in the setting of IV therapy - Monitor BP  Dementia without behavioral disturbance: Chronic, stable. Oriented to self and place.  Repeats questions.  Resident of [facility].  -Monitor mental status  FEN/GI: heart healthy  Prophylaxis: Eliquis   Disposition: likely DC today  Subjective:  No acute events overnight. Patient pleasant and comfortable this AM. Denying dyspnea. Feels the swelling in her legs has significantly improved. She states she recently moved from [place], plans to live with her daughter who will help her out.   Objective: Temp:  [97.5 F (36.4 C)-98 F (36.7 C)] 97.5 F (36.4 C) (12/28 0634) Pulse Rate:  [46-63] 51 (12/28 0634) Resp:  [18-19] 18 (12/28 0634) BP: (105-155)/(51-78) 105/78 (12/28 0634) SpO2:  [93 %-98 %] 96 % (12/28 0634) Weight:  [68.1 kg] 68.1  kg (12/28 0500) Physical Exam: General: Alert, NAD, conversant Cardiac: Bradycardic, irregularly irregular Lungs:CTA bil,  comfortable work of breathing Abdomen: soft, non-tender, non-distended, normoactive BS Msk: moves 4 extremities equally, no gross deformity Ext: Warm, trace to +1 to mid calf bilaterally  Laboratory: Recent Labs  Lab 12/15/18 1442 12/16/18 0345 12/17/18 0424  WBC 9.6 8.5 7.8  HGB 11.5* 13.0 12.3  HCT 36.6 40.0 38.3  PLT 236 196 187   Recent Labs  Lab 12/15/18 1442 12/16/18 0345 12/17/18 0424  NA 136 138 134*  K 4.7 3.4* 4.4  CL 108 99 97*  CO2 20* 27 27  BUN 19 18 24*  CREATININE 1.30* 1.34* 1.42*  CALCIUM 8.3* 9.3 9.3  GLUCOSE 105* 88 94      Imaging/Diagnostic Tests: No results found.  Everrett Coombe, MD 12/17/2018, 8:18 AM PGY-3, [place] Intern pager: [phone], text pages welcome

## 2018-12-17 NOTE — Progress Notes (Signed)
CSW received a call from pt's RN stating pt is ready for D/C back to [place].  CSW called Ebony Hail Interior and spatial designer at [facility]	[place] at ph: [phone] and Pam at [facility] ph: [phone] and left a VM.  CSW then called pt's daughter who voiced understanding that pt was to D/C back to [place] to:  [place] [address]  CSW received a call from [facility] has been accepted by: [facility] Number for report is: [phone] Pt's unit/room/bed number will be: Room 43 Accepting physician: ALF MD  Pt can arrive ASAP on 12/28  CSW will update RN and EDP.  Alphonse Guild. Tenasia Aull, LCSW, LCAS, CSI Clinical Social Worker Ph: [phone]

## 2018-12-19 LAB — AMIODARONE LEVEL
Amiodarone Lvl: 0.9 ug/mL — ABNORMAL LOW (ref 1.0–2.5)
N-Desethyl-Amiodarone: 0.6 ug/mL — ABNORMAL LOW (ref 1.0–2.5)

## 2018-12-23 ENCOUNTER — Telehealth: Payer: Self-pay | Admitting: Cardiology

## 2018-12-23 NOTE — Telephone Encounter (Signed)
Left message with to call office back to schedule TOC appt. See note below.

## 2018-12-23 NOTE — Telephone Encounter (Signed)
Patient is scheduled for [facility] appt with:  Kerin Ransom, 01/03/2019, request by Murray Hodgkins, NP

## 2018-12-23 NOTE — Telephone Encounter (Signed)
-----   Message from Theora Gianotti, NP sent at 12/17/2018  8:59 AM EST ----- Good morning,  Nicole Barker is being d/c'd from [facility] this weekend and will need a f/u 7 day TOC with [place] or APP on his team.  Thanks,  Gerald Stabs

## 2018-12-23 NOTE — Telephone Encounter (Addendum)
TOC- called tel # listed for the pt. Pt is a current resident at [facility]. Was told by the operator that all clinical staff has left for the day. I asked if she could confirm that they are aware of the pt upcoming appt on 01/03/19. She could not and she would not take a message. She sts that someone will need to call back on Mon 12/26/18.

## 2018-12-26 NOTE — Telephone Encounter (Signed)
Called tel # listed for the pt. Pt is a current resident at [facility]. Operator verified appt with Kerin Ransom, 01/03/2019 @830AM  will send all appropriate paperwork

## 2019-01-02 ENCOUNTER — Encounter: Payer: Self-pay | Admitting: Cardiology

## 2019-01-03 ENCOUNTER — Ambulatory Visit (INDEPENDENT_AMBULATORY_CARE_PROVIDER_SITE_OTHER): Payer: Medicare Other | Admitting: Cardiology

## 2019-01-03 ENCOUNTER — Encounter: Payer: Self-pay | Admitting: Cardiology

## 2019-01-03 DIAGNOSIS — N183 Chronic kidney disease, stage 3 unspecified: Secondary | ICD-10-CM

## 2019-01-03 DIAGNOSIS — Z7901 Long term (current) use of anticoagulants: Secondary | ICD-10-CM

## 2019-01-03 DIAGNOSIS — I5041 Acute combined systolic (congestive) and diastolic (congestive) heart failure: Secondary | ICD-10-CM | POA: Diagnosis not present

## 2019-01-03 DIAGNOSIS — I480 Paroxysmal atrial fibrillation: Secondary | ICD-10-CM

## 2019-01-03 DIAGNOSIS — I428 Other cardiomyopathies: Secondary | ICD-10-CM

## 2019-01-03 DIAGNOSIS — F0150 Vascular dementia without behavioral disturbance: Secondary | ICD-10-CM

## 2019-01-03 LAB — BASIC METABOLIC PANEL
BUN/Creatinine Ratio: 11 — ABNORMAL LOW (ref 12–28)
BUN: 11 mg/dL (ref 8–27)
CO2: 23 mmol/L (ref 20–29)
Calcium: 10.1 mg/dL (ref 8.7–10.3)
Chloride: 100 mmol/L (ref 96–106)
Creatinine, Ser: 0.97 mg/dL (ref 0.57–1.00)
GFR calc Af Amer: 61 mL/min/{1.73_m2} (ref >59)
GFR calc non Af Amer: 53 mL/min/{1.73_m2} — ABNORMAL LOW (ref >59)
Glucose: 84 mg/dL (ref 65–99)
Potassium: 4.6 mmol/L (ref 3.5–5.2)
Sodium: 139 mmol/L (ref 134–144)

## 2019-01-03 MED ORDER — FUROSEMIDE 40 MG PO TABS: 40.0000 mg | ORAL_TABLET | Freq: Every day | ORAL | 0 refills | Status: DC

## 2019-01-03 NOTE — Assessment & Plan Note (Signed)
GFR 35-40

## 2019-01-03 NOTE — Assessment & Plan Note (Signed)
Etiology not yet determined- EF 45-50% by echo 10/25/18 Moderate MR, mild AS

## 2019-01-03 NOTE — Assessment & Plan Note (Signed)
[place] changed to Amiodarone Nov 2019 after she presented with CHF. Diltiazem and metoprolol DC'd in Dec 2019 secondary to bradycardia

## 2019-01-03 NOTE — Assessment & Plan Note (Signed)
Eliquis- CHADS VASC=5

## 2019-01-03 NOTE — Assessment & Plan Note (Signed)
Unable to give any significant history

## 2019-01-03 NOTE — Assessment & Plan Note (Signed)
Admitted 11/6-/11/01/18 with CHF-most likely secondary to breakthrough PAF Admitted again 12/15/18 with CHF secondary to slow AF Discharged on Lasix and Aldactone- but not on that at follow up

## 2019-01-03 NOTE — Progress Notes (Signed)
01/03/2019 Nicole Barker   December 06, 1930  [id number]  Primary Physician Merlene Laughter, MD Primary Cardiologist: Dr Stanford Breed  HPI: Patient is a pleasant 85 year old female with a history of dementia, she is a resident of [facility].  She has a history of PAF and has been on amiodarone diltiazem and metoprolol in the past.  She was admitted to the hospital in December with bradycardia and congestive heart failure.  Her metoprolol and diltiazem were discontinued and her heart rate improved.  She diuresed from 160 pounds down to 150 pounds.  She is seen in the office today as a transitional follow-up.  She was apparently discharged on Aldactone 12.5 mg daily and Lasix 40 mg daily but she does not appear to be on those medications now.  We are trying to contact the facility to find out if this was discontinued intentionally.  Symptomatically the patient denies any shortness of breath.  She says she feels well.  She is really unable to give any other history secondary to dementia.   Current Outpatient Medications  Medication Sig Dispense Refill  . acetaminophen (TYLENOL) 325 MG tablet Take 2 tablets (650 mg total) by mouth every 6 (six) hours as needed for mild pain or headache. 30 tablet 0  . albuterol (ACCUNEB) 0.63 MG/3ML nebulizer solution Take 1 ampule by nebulization every 8 (eight) hours as needed for shortness of breath.    Marland Kitchen amLODipine (NORVASC) 2.5 MG tablet Take 1 tablet (2.5 mg total) by mouth daily. 30 tablet 0  . apixaban (ELIQUIS) 5 MG TABS tablet Take 5 mg by mouth 2 (two) times daily.    . fluticasone (FLONASE) 50 MCG/ACT nasal spray Place 1 spray into both nostrils daily. 16 g 2  . gabapentin (NEURONTIN) 100 MG capsule Take 100 mg by mouth 2 (two) times daily.    Marland Kitchen losartan (COZAAR) 25 MG tablet Take 1 tablet (25 mg total) by mouth daily. 30 tablet 0  . Multiple Vitamin (MULTIVITAMIN WITH MINERALS) TABS tablet Take 1 tablet by mouth daily. Centrum Silver      . polyethylene glycol (MIRALAX / GLYCOLAX) packet Take 17 g by mouth daily. 14 each 0  . senna-docusate (SENOKOT-S) 8.6-50 MG tablet Take 1 tablet by mouth at bedtime as needed for mild constipation. 30 tablet 0  . furosemide (LASIX) 40 MG tablet Take 1 tablet (40 mg total) by mouth daily. (Patient not taking: Reported on 01/03/2019) 30 tablet 0  . spironolactone (ALDACTONE) 25 MG tablet Take 0.5 tablets (12.5 mg total) by mouth daily. 30 tablet 0   No current facility-administered medications for this visit.     Allergies  Allergen Reactions  . Iodine Other (See Comments)    Unknown reaction    Past Medical History:  Diagnosis Date  . Anxiety   . Atrial fibrillation ([place])   . Dementia ([place])   . Hyperlipidemia   . Hypertension     Social History   Socioeconomic History  . Marital status: Widowed    Spouse name: Not on file  . Number of children: Not on file  . Years of education: Not on file  . Highest education level: Not on file  Occupational History  . Not on file  Social Needs  . Financial resource strain: Not on file  . Food insecurity:    Worry: Not on file    Inability: Not on file  . Transportation needs:    Medical: Not on file    Non-medical: Not on  file  Tobacco Use  . Smoking status: Never Smoker  . Smokeless tobacco: Never Used  Substance and Sexual Activity  . Alcohol use: Never    Frequency: Never  . Drug use: Never  . Sexual activity: Not on file  Lifestyle  . Physical activity:    Days per week: Not on file    Minutes per session: Not on file  . Stress: Not on file  Relationships  . Social connections:    Talks on phone: Not on file    Gets together: Not on file    Attends religious service: Not on file    Active member of club or organization: Not on file    Attends meetings of clubs or organizations: Not on file    Relationship status: Not on file  . Intimate partner violence:    Fear of current or ex partner: Not on file     Emotionally abused: Not on file    Physically abused: Not on file    Forced sexual activity: Not on file  Other Topics Concern  . Not on file  Social History Narrative  . Not on file     Family History  Problem Relation Age of Onset  . Hypertension Mother      Review of Systems: General: unobtainable- dementia    Blood pressure (!) 180/94, pulse 61, height 4\' 11"  (1.499 m), weight 151 lb (68.5 kg), SpO2 96 %.  General appearance: alert, cooperative and no distress Neck: no carotid bruit and no JVD Lungs: clear to auscultation bilaterally Heart: regular rate and rhythm and 2/6 systolic murmur AOV, LSB Extremities: trace edema Skin: warm and dry Neurologic: Grossly normal  EKG-NSR-HR 60, Q V2  ASSESSMENT AND PLAN:   Acute heart failure ([place]) Admitted 11/6-/11/01/18 with CHF-most likely secondary to breakthrough PAF-and again 12/26-12/28 with CHF secondary to slow VR- medications adjusted  PAF (paroxysmal atrial fibrillation) ([place]) Multaq changed to Amiodarone Nov 2019 after she presented with CHF- Diltiazem and metoprolol stopped in Dec secondary to bradycardia. NSR today  Cardiomyopathy ([place]) Etiology not yet determined- EF 45-50% by echo 10/25/18 Moderate MR, mild AS  AKI (acute kidney injury) ([place]) Check BMP  Essential hypertension High today but she hasn't had her medications yet  Dementia without behavioral disturbance (HCC) Stable  Chronic anticoagulation Eliquis- CHADS VASC=5  PLAN  She was supposed to be discharged on Lasix and Aldactone though her K+ was 5.3 at discharge. She is apparently on neither now though I'm not sure why, (unable to get in touch with facility). I'll resume Lasix 40 mg daily and check a BMP today.  He B/P is high but she hasn't had her medications yet today.  I'll see her in follow up in two weeks.   Kerin Ransom PA-C 01/03/2019 8:41 AM

## 2019-01-03 NOTE — Patient Instructions (Addendum)
Medication Instructions:   START TODAY LASIX 40 MG DAILY  If you need a refill on your cardiac medications before your next appointment, please call your pharmacy.   Lab work:  BMET   If you have labs (blood work) drawn today and your tests are completely normal, you will receive your results only by: Marland Kitchen MyChart Message (if you have MyChart) OR . A paper copy in the mail If you have any lab test that is abnormal or we need to change your treatment, we will call you to review the results.  Testing/Procedures:  NONE   Follow-Up:  Your physician recommends that you have follow-up appointment in 2 weeks with Kerin Ransom, PA-C

## 2019-01-17 ENCOUNTER — Ambulatory Visit (INDEPENDENT_AMBULATORY_CARE_PROVIDER_SITE_OTHER): Payer: Medicare Other | Admitting: Cardiology

## 2019-01-17 ENCOUNTER — Encounter: Payer: Self-pay | Admitting: Cardiology

## 2019-01-17 VITALS — BP 172/70 | HR 71 | Ht 59.0 in | Wt 154.6 lb

## 2019-01-17 DIAGNOSIS — I480 Paroxysmal atrial fibrillation: Secondary | ICD-10-CM | POA: Diagnosis not present

## 2019-01-17 NOTE — Progress Notes (Signed)
01/17/2019 Nicole Barker   1930/05/08  [id number]   Primary Physician Merlene Laughter, MD Primary Cardiologist: Dr Stanford Breed  HPI:  Patient is a pleasant 85 year old female with a history of dementia, she is a resident of [facility].  She has a history of PAF and has been on amiodarone diltiazem and metoprolol in the past.  She was admitted to the hospital in December with bradycardia and congestive heart failure.  Her metoprolol and diltiazem were discontinued and her heart rate improved.  She diuresed from 160 pounds down to 150 pounds.  She is seen in the office today as a transitional follow-up.  She was apparently discharged on Aldactone 12.5 mg daily and Lasix 40 mg daily but she did not appear to be on those medications when I saw her in clinic 01/03/2019.  We tried to contact the facility to find out if this was discontinued intentionally but were unable to contact them.  I suggested we start Lasix back and I had her return today for follow up. She tells me she is doing well, she denies unusual SOB.  She uses a rolling walker.  Losartan was apparently increased at [facility] to 50 mg for her B/P.  Repeat B/P by me was 142/72.  Her EKG today shows she is in NSR with PACs.  Her weight is 151 lbs, discharge weight 12/17/18 was 150 lbs.    Current Outpatient Medications  Medication Sig Dispense Refill  . acetaminophen (TYLENOL) 325 MG tablet Take 2 tablets (650 mg total) by mouth every 6 (six) hours as needed for mild pain or headache. 30 tablet 0  . albuterol (ACCUNEB) 0.63 MG/3ML nebulizer solution Take 1 ampule by nebulization every 8 (eight) hours as needed for shortness of breath.    Marland Kitchen amLODipine (NORVASC) 2.5 MG tablet Take 1 tablet (2.5 mg total) by mouth daily. 30 tablet 0  . apixaban (ELIQUIS) 5 MG TABS tablet Take 5 mg by mouth 2 (two) times daily.    . fluticasone (FLONASE) 50 MCG/ACT nasal spray Place 1 spray into both nostrils daily. 16 g 2  . furosemide  (LASIX) 40 MG tablet Take 1 tablet (40 mg total) by mouth daily. 30 tablet 0  . gabapentin (NEURONTIN) 100 MG capsule Take 100 mg by mouth 2 (two) times daily.    Marland Kitchen losartan (COZAAR) 50 MG tablet Take 50 mg by mouth daily.    . Multiple Vitamin (MULTIVITAMIN WITH MINERALS) TABS tablet Take 1 tablet by mouth daily. Centrum Silver    . polyethylene glycol (MIRALAX / GLYCOLAX) packet Take 17 g by mouth daily. 14 each 0  . senna-docusate (SENOKOT-S) 8.6-50 MG tablet Take 1 tablet by mouth at bedtime as needed for mild constipation. 30 tablet 0   No current facility-administered medications for this visit.     Allergies  Allergen Reactions  . Iodine Other (See Comments)    Unknown reaction    Past Medical History:  Diagnosis Date  . Anxiety   . Atrial fibrillation ([place])   . Dementia ([place])   . Hyperlipidemia   . Hypertension     Social History   Socioeconomic History  . Marital status: Widowed    Spouse name: Not on file  . Number of children: Not on file  . Years of education: Not on file  . Highest education level: Not on file  Occupational History  . Not on file  Social Needs  . Financial resource strain: Not on file  . Food  insecurity:    Worry: Not on file    Inability: Not on file  . Transportation needs:    Medical: Not on file    Non-medical: Not on file  Tobacco Use  . Smoking status: Never Smoker  . Smokeless tobacco: Never Used  Substance and Sexual Activity  . Alcohol use: Never    Frequency: Never  . Drug use: Never  . Sexual activity: Not on file  Lifestyle  . Physical activity:    Days per week: Not on file    Minutes per session: Not on file  . Stress: Not on file  Relationships  . Social connections:    Talks on phone: Not on file    Gets together: Not on file    Attends religious service: Not on file    Active member of club or organization: Not on file    Attends meetings of clubs or organizations: Not on file    Relationship status: Not on  file  . Intimate partner violence:    Fear of current or ex partner: Not on file    Emotionally abused: Not on file    Physically abused: Not on file    Forced sexual activity: Not on file  Other Topics Concern  . Not on file  Social History Narrative  . Not on file     Family History  Problem Relation Age of Onset  . Hypertension Mother      Review of Systems: General: negative for chills, fever, night sweats or weight changes.  Cardiovascular: negative for chest pain, dyspnea on exertion, edema, orthopnea, palpitations, paroxysmal nocturnal dyspnea or shortness of breath Dermatological: negative for rash Respiratory: negative for cough or wheezing Urologic: negative for hematuria Abdominal: negative for nausea, vomiting, diarrhea, bright red blood per rectum, melena, or hematemesis Neurologic: negative for visual changes, syncope, or dizziness All other systems reviewed and are otherwise negative except as noted above.    Blood pressure (!) 172/70, pulse 71, height 4\' 11"  (1.499 m), weight 154 lb 9.6 oz (70.1 kg), SpO2 99 %.  General appearance: alert, cooperative and no distress Neck: no carotid bruit and no JVD Lungs: clear to auscultation bilaterally Heart: regular rate and rhythm and 2/6 systolic murmur AOV, LSB Extremities: trace edema, varicosities noted Skin: Skin color, texture, turgor normal. No rashes or lesions Neurologic: Grossly normal  EKG NSR, PACs Q in V2-V3  ASSESSMENT AND PLAN:   Acute heart failure ([place]) Admitted 11/6-/11/01/18 with CHF-most likely secondary to breakthrough PAF-and again 12/26-12/28 with CHF secondary to slow VR- medications adjusted  PAF (paroxysmal atrial fibrillation) ([place]) Multaq changed to Amiodarone Nov 2019 after she presented with CHF- Diltiazem and metoprolol stopped in Dec secondary to bradycardia. NSR today  Cardiomyopathy ([employer]) Etiology not yet determined- EF 45-50% by echo 10/25/18 Moderate MR, mild AS  AKI  (acute kidney injury) ([place]) Check BMP  Essential hypertension High today but she hasn't had her medications yet. Repeat B/P by me 142/72  Dementia without behavioral disturbance (HCC) Stable  Chronic anticoagulation Eliquis- CHADS VASC=5  PLAN  She appears to be doing well- on Lasix, in NSR.  F/U 6 months.   Kerin Ransom PA-C 01/17/2019 4:59 PM

## 2019-01-17 NOTE — Patient Instructions (Addendum)
Medication Instructions:   NONE  If you need a refill on your cardiac medications before your next appointment, please call your pharmacy.   Lab work:  NONE  If you have labs (blood work) drawn today and your tests are completely normal, you will receive your results only by: Marland Kitchen MyChart Message (if you have MyChart) OR . A paper copy in the mail If you have any lab test that is abnormal or we need to change your treatment, we will call you to review the results.  Testing/Procedures:  NONE  Follow-Up: At [facility], you and your health needs are our priority.  As part of our continuing mission to provide you with exceptional heart care, we have created designated Provider Care Teams.  These Care Teams include your primary Cardiologist (physician) and Advanced Practice Providers (APPs -  Physician Assistants and Nurse Practitioners) who all work together to provide you with the care you need, when you need it. You will need a follow up appointment in 6 months.  Please call our office 2 months in advance to schedule this appointment.  You may see Kirk Ruths, MD or one of the following Advanced Practice Providers on your designated Care Team:   Kerin Ransom, PA-C Roby Lofts, [place] . Sande Rives, PA-C

## 2019-02-21 ENCOUNTER — Ambulatory Visit: Payer: Medicare Other | Admitting: Cardiology

## 2019-06-28 ENCOUNTER — Telehealth: Payer: Self-pay | Admitting: *Deleted

## 2019-06-28 NOTE — Telephone Encounter (Signed)
Nicole Barker, is in a Assisted Living and they are not transporting at this time.

## 2019-07-07 ENCOUNTER — Emergency Department (HOSPITAL_COMMUNITY): Payer: Medicare Other

## 2019-07-07 ENCOUNTER — Other Ambulatory Visit: Payer: Self-pay

## 2019-07-07 ENCOUNTER — Encounter (HOSPITAL_COMMUNITY): Payer: Self-pay | Admitting: Emergency Medicine

## 2019-07-07 ENCOUNTER — Inpatient Hospital Stay (HOSPITAL_COMMUNITY)
Admission: EM | Admit: 2019-07-07 | Discharge: 2019-07-09 | DRG: 291 | Disposition: A | Discharge status: Home / Self Care | Payer: Medicare Other | Source: Skilled Nursing Facility | Attending: Family Medicine | Admitting: Family Medicine

## 2019-07-07 DIAGNOSIS — Z91041 Radiographic dye allergy status: Secondary | ICD-10-CM

## 2019-07-07 DIAGNOSIS — I495 Sick sinus syndrome: Secondary | ICD-10-CM | POA: Diagnosis present

## 2019-07-07 DIAGNOSIS — Z79899 Other long term (current) drug therapy: Secondary | ICD-10-CM | POA: Diagnosis not present

## 2019-07-07 DIAGNOSIS — Z7901 Long term (current) use of anticoagulants: Secondary | ICD-10-CM

## 2019-07-07 DIAGNOSIS — Z79890 Hormone replacement therapy (postmenopausal): Secondary | ICD-10-CM | POA: Diagnosis not present

## 2019-07-07 DIAGNOSIS — I4891 Unspecified atrial fibrillation: Secondary | ICD-10-CM

## 2019-07-07 DIAGNOSIS — Z66 Do not resuscitate: Secondary | ICD-10-CM | POA: Diagnosis present

## 2019-07-07 DIAGNOSIS — Z7951 Long term (current) use of inhaled steroids: Secondary | ICD-10-CM

## 2019-07-07 DIAGNOSIS — I480 Paroxysmal atrial fibrillation: Secondary | ICD-10-CM | POA: Diagnosis present

## 2019-07-07 DIAGNOSIS — Z20828 Contact with and (suspected) exposure to other viral communicable diseases: Secondary | ICD-10-CM | POA: Diagnosis present

## 2019-07-07 DIAGNOSIS — I509 Heart failure, unspecified: Secondary | ICD-10-CM | POA: Diagnosis not present

## 2019-07-07 DIAGNOSIS — I5023 Acute on chronic systolic (congestive) heart failure: Secondary | ICD-10-CM | POA: Diagnosis present

## 2019-07-07 DIAGNOSIS — F0390 Unspecified dementia without behavioral disturbance: Secondary | ICD-10-CM | POA: Diagnosis present

## 2019-07-07 DIAGNOSIS — I130 Hypertensive heart and chronic kidney disease with heart failure and stage 1 through stage 4 chronic kidney disease, or unspecified chronic kidney disease: Principal | ICD-10-CM | POA: Diagnosis present

## 2019-07-07 DIAGNOSIS — Z8249 Family history of ischemic heart disease and other diseases of the circulatory system: Secondary | ICD-10-CM | POA: Diagnosis not present

## 2019-07-07 DIAGNOSIS — I429 Cardiomyopathy, unspecified: Secondary | ICD-10-CM | POA: Diagnosis present

## 2019-07-07 DIAGNOSIS — R0602 Shortness of breath: Secondary | ICD-10-CM | POA: Diagnosis present

## 2019-07-07 DIAGNOSIS — J45909 Unspecified asthma, uncomplicated: Secondary | ICD-10-CM | POA: Diagnosis present

## 2019-07-07 DIAGNOSIS — N183 Chronic kidney disease, stage 3 (moderate): Secondary | ICD-10-CM | POA: Diagnosis present

## 2019-07-07 DIAGNOSIS — F419 Anxiety disorder, unspecified: Secondary | ICD-10-CM | POA: Diagnosis present

## 2019-07-07 DIAGNOSIS — E785 Hyperlipidemia, unspecified: Secondary | ICD-10-CM | POA: Diagnosis present

## 2019-07-07 DIAGNOSIS — J189 Pneumonia, unspecified organism: Secondary | ICD-10-CM | POA: Diagnosis present

## 2019-07-07 HISTORY — DX: Unspecified asthma, uncomplicated: J45.909

## 2019-07-07 LAB — URINALYSIS, ROUTINE W REFLEX MICROSCOPIC
Bilirubin Urine: NEGATIVE
Glucose, UA: NEGATIVE mg/dL
Hgb urine dipstick: NEGATIVE
Ketones, ur: NEGATIVE mg/dL
Nitrite: NEGATIVE
Protein, ur: NEGATIVE mg/dL
Specific Gravity, Urine: 1.013 (ref 1.005–1.030)
pH: 5 (ref 5.0–8.0)

## 2019-07-07 LAB — TROPONIN I (HIGH SENSITIVITY): Troponin I (High Sensitivity): 12 ng/L (ref <18)

## 2019-07-07 LAB — MAGNESIUM: Magnesium: 2.1 mg/dL (ref 1.7–2.4)

## 2019-07-07 LAB — BASIC METABOLIC PANEL
Anion gap: 13 (ref 5–15)
BUN: 40 mg/dL — ABNORMAL HIGH (ref 8–23)
CO2: 23 mmol/L (ref 22–32)
Calcium: 9 mg/dL (ref 8.9–10.3)
Chloride: 104 mmol/L (ref 98–111)
Creatinine, Ser: 1.65 mg/dL — ABNORMAL HIGH (ref 0.44–1.00)
GFR calc Af Amer: 32 mL/min — ABNORMAL LOW (ref >60)
GFR calc non Af Amer: 28 mL/min — ABNORMAL LOW (ref >60)
Glucose, Bld: 104 mg/dL — ABNORMAL HIGH (ref 70–99)
Potassium: 3.7 mmol/L (ref 3.5–5.1)
Sodium: 140 mmol/L (ref 135–145)

## 2019-07-07 LAB — MRSA PCR SCREENING: MRSA by PCR: NEGATIVE

## 2019-07-07 LAB — CBC
HCT: 37.7 % (ref 36.0–46.0)
Hemoglobin: 12 g/dL (ref 12.0–15.0)
MCH: 29.2 pg (ref 26.0–34.0)
MCHC: 31.8 g/dL (ref 30.0–36.0)
MCV: 91.7 fL (ref 80.0–100.0)
Platelets: 259 10*3/uL (ref 150–400)
RBC: 4.11 MIL/uL (ref 3.87–5.11)
RDW: 18.2 % — ABNORMAL HIGH (ref 11.5–15.5)
WBC: 11.6 10*3/uL — ABNORMAL HIGH (ref 4.0–10.5)
nRBC: 0 % (ref 0.0–0.2)

## 2019-07-07 LAB — BRAIN NATRIURETIC PEPTIDE: B Natriuretic Peptide: 711.6 pg/mL — ABNORMAL HIGH (ref 0.0–100.0)

## 2019-07-07 LAB — SARS CORONAVIRUS 2 BY RT PCR (HOSPITAL ORDER, PERFORMED IN ~~LOC~~ HOSPITAL LAB): SARS Coronavirus 2: NEGATIVE

## 2019-07-07 MED ORDER — ATORVASTATIN CALCIUM 10 MG PO TABS
10.0000 mg | ORAL_TABLET | Freq: Every day | ORAL | Status: DC
  Administered 2019-07-07 – 2019-07-08 (×2): 10 mg via ORAL
  Filled 2019-07-07 (×2): qty 1

## 2019-07-07 MED ORDER — SODIUM CHLORIDE 0.9 % IV SOLN
2.0000 g | Freq: Once | INTRAVENOUS | Status: AC
  Administered 2019-07-07: 2 g via INTRAVENOUS
  Filled 2019-07-07: qty 2

## 2019-07-07 MED ORDER — SODIUM CHLORIDE 0.9 % IV SOLN: 2.0000 g | INTRAVENOUS | Status: DC

## 2019-07-07 MED ORDER — FLUTICASONE FUROATE-VILANTEROL 100-25 MCG/INH IN AEPB
2.0000 | INHALATION_SPRAY | Freq: Every day | RESPIRATORY_TRACT | Status: DC
  Administered 2019-07-08 – 2019-07-09 (×2): 2 via RESPIRATORY_TRACT
  Filled 2019-07-07: qty 28

## 2019-07-07 MED ORDER — GUAIFENESIN 100 MG/5ML PO SOLN
5.0000 mL | ORAL | Status: DC | PRN
  Administered 2019-07-07 – 2019-07-08 (×2): 100 mg via ORAL
  Filled 2019-07-07 (×2): qty 5

## 2019-07-07 MED ORDER — APIXABAN 2.5 MG PO TABS
2.5000 mg | ORAL_TABLET | Freq: Two times a day (BID) | ORAL | Status: DC
  Administered 2019-07-08 – 2019-07-09 (×3): 2.5 mg via ORAL
  Filled 2019-07-07 (×3): qty 1

## 2019-07-07 MED ORDER — SODIUM CHLORIDE 0.9% FLUSH
3.0000 mL | Freq: Two times a day (BID) | INTRAVENOUS | Status: DC
  Administered 2019-07-08 – 2019-07-09 (×4): 3 mL via INTRAVENOUS

## 2019-07-07 MED ORDER — FUROSEMIDE 10 MG/ML IJ SOLN
20.0000 mg | Freq: Once | INTRAMUSCULAR | Status: AC
  Administered 2019-07-08: 20 mg via INTRAVENOUS
  Filled 2019-07-07: qty 2

## 2019-07-07 MED ORDER — GABAPENTIN 100 MG PO CAPS
100.0000 mg | ORAL_CAPSULE | Freq: Two times a day (BID) | ORAL | Status: DC
  Administered 2019-07-08 – 2019-07-09 (×3): 100 mg via ORAL
  Filled 2019-07-07 (×4): qty 1

## 2019-07-07 MED ORDER — DIVALPROEX SODIUM ER 250 MG PO TB24
250.0000 mg | ORAL_TABLET | Freq: Two times a day (BID) | ORAL | Status: DC
  Filled 2019-07-07: qty 1

## 2019-07-07 MED ORDER — AMLODIPINE BESYLATE 2.5 MG PO TABS
2.5000 mg | ORAL_TABLET | Freq: Every day | ORAL | Status: DC
  Administered 2019-07-07: 2.5 mg via ORAL
  Filled 2019-07-07 (×2): qty 1

## 2019-07-07 MED ORDER — ACETAMINOPHEN 325 MG PO TABS
650.0000 mg | ORAL_TABLET | Freq: Four times a day (QID) | ORAL | Status: DC | PRN
  Administered 2019-07-07: 650 mg via ORAL
  Filled 2019-07-07: qty 2

## 2019-07-07 MED ORDER — FLUTICASONE PROPIONATE 50 MCG/ACT NA SUSP
1.0000 | Freq: Every day | NASAL | Status: DC
  Administered 2019-07-07 – 2019-07-09 (×3): 1 via NASAL
  Filled 2019-07-07: qty 16

## 2019-07-07 MED ORDER — MELATONIN 3 MG PO TABS
3.0000 mg | ORAL_TABLET | Freq: Every day | ORAL | Status: DC
  Administered 2019-07-07 – 2019-07-08 (×2): 3 mg via ORAL
  Filled 2019-07-07 (×3): qty 1

## 2019-07-07 MED ORDER — VANCOMYCIN HCL IN DEXTROSE 1-5 GM/200ML-% IV SOLN
1000.0000 mg | Freq: Once | INTRAVENOUS | Status: AC
  Administered 2019-07-07: 1000 mg via INTRAVENOUS
  Filled 2019-07-07: qty 200

## 2019-07-07 MED ORDER — VANCOMYCIN HCL 500 MG IV SOLR: 500.0000 mg | INTRAVENOUS | Status: DC

## 2019-07-07 MED ORDER — POLYETHYLENE GLYCOL 3350 17 G PO PACK
17.0000 g | PACK | Freq: Every day | ORAL | Status: DC
  Administered 2019-07-07 – 2019-07-09 (×3): 17 g via ORAL
  Filled 2019-07-07 (×3): qty 1

## 2019-07-07 MED ORDER — ESCITALOPRAM OXALATE 10 MG PO TABS
10.0000 mg | ORAL_TABLET | Freq: Every day | ORAL | Status: DC
  Administered 2019-07-07 – 2019-07-09 (×3): 10 mg via ORAL
  Filled 2019-07-07 (×3): qty 1

## 2019-07-07 MED ORDER — DIVALPROEX SODIUM 125 MG PO CSDR
250.0000 mg | DELAYED_RELEASE_CAPSULE | Freq: Two times a day (BID) | ORAL | Status: AC
  Administered 2019-07-07 – 2019-07-08 (×2): 250 mg via ORAL
  Filled 2019-07-07 (×2): qty 2

## 2019-07-07 MED ORDER — ALBUTEROL SULFATE (2.5 MG/3ML) 0.083% IN NEBU
2.5000 mg | INHALATION_SOLUTION | Freq: Three times a day (TID) | RESPIRATORY_TRACT | Status: DC | PRN
  Administered 2019-07-08 – 2019-07-09 (×2): 2.5 mg via RESPIRATORY_TRACT
  Filled 2019-07-07 (×2): qty 3

## 2019-07-07 MED ORDER — GABAPENTIN 100 MG PO CAPS
100.0000 mg | ORAL_CAPSULE | Freq: Two times a day (BID) | ORAL | Status: DC
  Administered 2019-07-07: 100 mg via ORAL
  Filled 2019-07-07: qty 1

## 2019-07-07 MED ORDER — FUROSEMIDE 10 MG/ML IJ SOLN
20.0000 mg | Freq: Once | INTRAMUSCULAR | Status: AC
  Administered 2019-07-07: 20 mg via INTRAVENOUS
  Filled 2019-07-07: qty 2

## 2019-07-07 MED ORDER — APIXABAN 2.5 MG PO TABS
2.5000 mg | ORAL_TABLET | Freq: Two times a day (BID) | ORAL | Status: DC
  Administered 2019-07-07: 2.5 mg via ORAL
  Filled 2019-07-07 (×3): qty 1

## 2019-07-07 MED ORDER — SENNOSIDES-DOCUSATE SODIUM 8.6-50 MG PO TABS: 1.0000 | ORAL_TABLET | Freq: Every evening | ORAL | Status: DC | PRN

## 2019-07-07 MED ORDER — DIVALPROEX SODIUM ER 250 MG PO TB24
250.0000 mg | ORAL_TABLET | Freq: Two times a day (BID) | ORAL | Status: DC
  Administered 2019-07-08 – 2019-07-09 (×2): 250 mg via ORAL
  Filled 2019-07-07 (×4): qty 1

## 2019-07-07 NOTE — ED Notes (Addendum)
ED TO INPATIENT HANDOFF REPORT  ED Nurse Name and Phone #: [name] [place] Name/Age/Gender Nicole Barker 85 y.o. female Room/Bed: 019C/019C  Code Status   Code Status: DNR  Home/SNF/Other Skilled nursing facility Patient oriented to: self, place, time and situation Is this baseline? Yes   Triage Complete: Triage complete  Chief Complaint sob  Triage Note Pt arrives via EMS from [facility]. Reports SOB and dry cough for the last week . Denies recent fever. Pt alert, oriented x3. Unable to state day of the week. Noted in afiv RVR rate 100-140s. Last bp 157/109.   Allergies Allergies  Allergen Reactions  . Iodine Other (See Comments)    Unknown reaction    Level of Care/Admitting Diagnosis ED Disposition    ED Disposition Condition Comment   Admit  Hospital Area: [facility] [[id number]]  Level of Care: Telemetry Cardiac [103]  Covid Evaluation: Confirmed COVID Negative  Diagnosis: Pneumonia [227785]  Admitting Physician: Sherene Sires [[id number]]  Attending Physician: Lind Covert [[id number]]  Estimated length of stay: past midnight tomorrow  Certification:: I certify this patient will need inpatient services for at least 2 midnights  PT Class (Do Not Modify): Inpatient [101]  PT Acc Code (Do Not Modify): Private [1]       B Medical/Surgery History Past Medical History:  Diagnosis Date  . Anxiety   . Asthma   . Atrial fibrillation ([place])   . Dementia ([place])   . Hyperlipidemia   . Hypertension    Past Surgical History:  Procedure Laterality Date  . ABDOMINAL HYSTERECTOMY    . BACK SURGERY    . JOINT REPLACEMENT     knee     A IV Location/Drains/Wounds Patient Lines/Drains/Airways Status   Active Line/Drains/Airways    Name:   Placement date:   Placement time:   Site:   Days:   Peripheral IV 12/15/18 Left Forearm   12/15/18    1430    Forearm   204   Peripheral IV 07/07/19 Left Antecubital   07/07/19    -    Antecubital    less than 1   External Urinary Catheter   07/07/19    1035    -   less than 1          Intake/Output Last 24 hours  Intake/Output Summary (Last 24 hours) at 07/07/2019 1533 Last data filed at 07/07/2019 1255 Gross per 24 hour  Intake 100 ml  Output -  Net 100 ml    Labs/Imaging Results for orders placed or performed during the hospital encounter of 07/07/19 (from the past 48 hour(s))  CBC     Status: Abnormal   Collection Time: 07/07/19 10:10 AM  Result Value Ref Range   WBC 11.6 (H) 4.0 - 10.5 K/uL   RBC 4.11 3.87 - 5.11 MIL/uL   Hemoglobin 12.0 12.0 - 15.0 g/dL   HCT 37.7 36.0 - 46.0 %   MCV 91.7 80.0 - 100.0 fL   MCH 29.2 26.0 - 34.0 pg   MCHC 31.8 30.0 - 36.0 g/dL   RDW 18.2 (H) 11.5 - 15.5 %   Platelets 259 150 - 400 K/uL   nRBC 0.0 0.0 - 0.2 %    Comment: Performed at [facility], [place] [address] [place], [place] [address]  Brain natriuretic peptide     Status: Abnormal   Collection Time: 07/07/19 10:11 AM  Result Value Ref Range   B Natriuretic Peptide 711.6 (H) 0.0 -  100.0 pg/mL    Comment: Performed at [facility], [place] [address] [place], [place] [address]  SARS Coronavirus 2 (CEPHEID- Performed in [place] hospital lab), Hosp Order     Status: None   Collection Time: 07/07/19 10:40 AM   Specimen: Nasopharyngeal Swab  Result Value Ref Range   SARS Coronavirus 2 NEGATIVE NEGATIVE    Comment: (NOTE) If result is NEGATIVE SARS-CoV-2 target nucleic acids are NOT DETECTED. The SARS-CoV-2 RNA is generally detectable in upper and lower  respiratory specimens during the acute phase of infection. The lowest  concentration of SARS-CoV-2 viral copies this assay can detect is 250  copies / mL. A negative result does not preclude SARS-CoV-2 infection  and should not be used as the sole basis for treatment or other  patient management decisions.  A negative result may occur with  improper specimen collection / handling, submission of specimen other   than nasopharyngeal swab, presence of viral mutation(s) within the  areas targeted by this assay, and inadequate number of viral copies  (<250 copies / mL). A negative result must be combined with clinical  observations, patient history, and epidemiological information. If result is POSITIVE SARS-CoV-2 target nucleic acids are DETECTED. The SARS-CoV-2 RNA is generally detectable in upper and lower  respiratory specimens dur ing the acute phase of infection.  Positive  results are indicative of active infection with SARS-CoV-2.  Clinical  correlation with patient history and other diagnostic information is  necessary to determine patient infection status.  Positive results do  not rule out bacterial infection or co-infection with other viruses. If result is PRESUMPTIVE POSTIVE SARS-CoV-2 nucleic acids MAY BE PRESENT.   A presumptive positive result was obtained on the submitted specimen  and confirmed on repeat testing.  While 2019 novel coronavirus  (SARS-CoV-2) nucleic acids may be present in the submitted sample  additional confirmatory testing may be necessary for epidemiological  and / or clinical management purposes  to differentiate between  SARS-CoV-2 and other Sarbecovirus currently known to infect humans.  If clinically indicated additional testing with an alternate test  methodology [phone]) is advised. The SARS-CoV-2 RNA is generally  detectable in upper and lower respiratory sp ecimens during the acute  phase of infection. The expected result is Negative. Fact Sheet for Patients:  StrictlyIdeas.no Fact Sheet for Healthcare Providers: BankingDealers.co.za This test is not yet approved or cleared by the [place] FDA and has been authorized for detection and/or diagnosis of SARS-CoV-2 by FDA under an Emergency Use Authorization (EUA).  This EUA will remain in effect (meaning this test can be used) for the duration of  the COVID-19 declaration under Section 564(b)(1) of the Act, 21 U.S.C. section 360bbb-3(b)(1), unless the authorization is terminated or revoked sooner. Performed at [facility], [place] [address], [place], [place] [address]   Troponin I (High Sensitivity)     Status: None   Collection Time: 07/07/19 11:51 AM  Result Value Ref Range   Troponin I (High Sensitivity) 12 <18 ng/L    Comment: (NOTE) Elevated high sensitivity troponin I (hsTnI) values and significant  changes across serial measurements may suggest ACS but many other  chronic and acute conditions are known to elevate hsTnI results.  Refer to the "Links" section for chest pain algorithms and additional  guidance. Performed at [facility], [place] [address], [place], [place] [address]   Basic metabolic panel     Status: Abnormal   Collection Time: 07/07/19 11:51  AM  Result Value Ref Range   Sodium 140 135 - 145 mmol/L   Potassium 3.7 3.5 - 5.1 mmol/L   Chloride 104 98 - 111 mmol/L   CO2 23 22 - 32 mmol/L   Glucose, Bld 104 (H) 70 - 99 mg/dL   BUN 40 (H) 8 - 23 mg/dL   Creatinine, Ser 1.65 (H) 0.44 - 1.00 mg/dL   Calcium 9.0 8.9 - 10.3 mg/dL   GFR calc non Af Amer 28 (L) >60 mL/min   GFR calc Af Amer 32 (L) >60 mL/min   Anion gap 13 5 - 15    Comment: Performed at [facility] [address], [place], [place] [address]  Magnesium     Status: None   Collection Time: 07/07/19 11:51 AM  Result Value Ref Range   Magnesium 2.1 1.7 - 2.4 mg/dL    Comment: Performed at [facility] [address], [place], [place] [address]   Dg Chest Port 1 View  Result Date: 07/07/2019 CLINICAL DATA:  Shortness of breath and dry cough for 1 week. EXAM: PORTABLE CHEST 1 VIEW COMPARISON:  Single-view of the chest 12/15/2018 and/07/2018. PA and lateral chest 11/04/2018. CT chest 11/02/2018. FINDINGS: Marked cardiomegaly. Pulmonary vascular congestion without frank edema is identified. Small bilateral pleural effusions  are present, greater on the left. There is left basilar airspace disease. IMPRESSION: Left basilar airspace disease could be due to atelectasis or pneumonia. Cardiomegaly and vascular congestion. Small bilateral pleural effusions, larger on the left. Electronically Signed   By: Inge Rise M.D.   On: 07/07/2019 11:07    Pending Labs Unresulted Labs (From admission, onward)    Start     Ordered   07/07/19 1011  Urinalysis, Routine w reflex microscopic  ONCE - STAT,   STAT     07/07/19 1010          Vitals/Pain Today's Vitals   07/07/19 1130 07/07/19 1245 07/07/19 1430 07/07/19 1432  BP: 107/73 118/71 (!) 135/101   Pulse: 65 86 (!) 52   Resp: (!) 32 (!) 31 (!) 22   Temp:      TempSrc:      SpO2: 94% 95% 96%   Weight:      Height:      PainSc:    0-No pain    Isolation Precautions No active isolations  Medications Medications  acetaminophen (TYLENOL) tablet 650 mg (has no administration in time range)  amLODipine (NORVASC) tablet 2.5 mg (has no administration in time range)  atorvastatin (LIPITOR) tablet 10 mg (has no administration in time range)  escitalopram (LEXAPRO) tablet 10 mg (has no administration in time range)  polyethylene glycol (MIRALAX / GLYCOLAX) packet 17 g (has no administration in time range)  senna-docusate (Senokot-S) tablet 1 tablet (has no administration in time range)  apixaban (ELIQUIS) tablet 2.5 mg (has no administration in time range)  Melatonin TABS 5 mg (has no administration in time range)  divalproex (DEPAKOTE ER) 24 hr tablet 250 mg (has no administration in time range)  gabapentin (NEURONTIN) capsule 100 mg (has no administration in time range)  albuterol (PROVENTIL) (2.5 MG/3ML) 0.083% nebulizer solution 2.5 mg (has no administration in time range)  fluticasone furoate-vilanterol (BREO ELLIPTA) 100-25 MCG/INH 2 puff (has no administration in time range)  fluticasone (FLONASE) 50 MCG/ACT nasal spray 1 spray (has no administration in time  range)  sodium chloride flush (NS) 0.9 % injection 3 mL (has no administration in time range)  ceFEPIme (MAXIPIME) 2 g in sodium chloride 0.9 % 100 mL IVPB (0 g Intravenous Stopped 07/07/19 1255)  vancomycin (VANCOCIN) IVPB 1000 mg/200 mL premix (1,000 mg Intravenous New Bag/Given 07/07/19 1245)  furosemide (LASIX) injection 20 mg (20 mg Intravenous Given 07/07/19 1422)    Mobility walks Low fall risk   Focused Assessments Pulmonary Assessment Handoff:  Lung sounds: Bilateral Breath Sounds: Clear L Breath Sounds: Clear R Breath Sounds: Clear O2 Device: Room Air        R Recommendations: See Admitting Provider Note  Report given to: Gloriajean Dell RN  Additional Notes:

## 2019-07-07 NOTE — ED Notes (Signed)
[place] daughter.

## 2019-07-07 NOTE — Progress Notes (Signed)
Pharmacy Antibiotic Note  Nicole Barker is a 85 y.o. female admitted on 07/07/2019 with pneumonia.  Pharmacy has been consulted for vancomycin dosing. Pt is afebrile and WBC is mildly elevated at 11.6. SCr is elevated at 1.65.   Plan: Vancomycin 1gm IV x 1 then 500mg  IV Q48H F/u renal fxn, C&S, clinical status and peak/trough at sS F/u continuation of cefepime or other gram negative coverage  Height: 4\' 11"  (149.9 cm) Weight: 150 lb (68 kg) IBW/kg (Calculated) : 43.2  Temp (24hrs), Avg:97.7 F (36.5 C), Min:97.7 F (36.5 C), Max:97.7 F (36.5 C)  Recent Labs  Lab 07/07/19 1010  WBC 11.6*    CrCl cannot be calculated (Patient's most recent lab result is older than the maximum 21 days allowed.).    Allergies  Allergen Reactions  . Iodine Other (See Comments)    Unknown reaction    Antimicrobials this admission: Vanc 7/17>> Cefepime x 1 7/17  Dose adjustments this admission: N/A  Microbiology results: Pending  Thank you for allowing pharmacy to be a part of this patient's care.  [name], Rande Lawman 07/07/2019 11:15 AM

## 2019-07-07 NOTE — ED Provider Notes (Signed)
[facility] EMERGENCY DEPARTMENT Provider Note   CSN: [id number] Arrival date & time: 07/07/19  0946    History   Chief Complaint Chief Complaint  Patient presents with   Shortness of Breath    HPI Nicole Barker is a 85 y.o. female with PMHx asthma, a fib, HTN, HLD, dementia who presents to the ED via EMS from [facility] for complaint of shortness of breath that started last week, worsening yesterday suddenly. Pt also complains of a dry cough as well. No fever or chills. No other complaints at this time including chest pain, abdominal pain, nausea, vomiting, worsening leg swelling.        Past Medical History:  Diagnosis Date   Anxiety    Asthma    Atrial fibrillation ([place])    Dementia ([place])    Hyperlipidemia    Hypertension     Patient Active Problem List   Diagnosis Date Noted   Pneumonia 07/07/2019   SOB (shortness of breath) 12/15/2018   Bradycardia 12/15/2018   Cardiomyopathy ([place]) 11/22/2018   Chronic anticoagulation 11/22/2018   Acute heart failure ([place]) 10/25/2018   PAF (paroxysmal atrial fibrillation) ([place]) 10/25/2018   Dementia without behavioral disturbance ([place]) 10/25/2018   Essential hypertension 10/25/2018   Hyperlipidemia 10/25/2018   CRI (chronic renal insufficiency), stage 3 (moderate) ([place]) 10/25/2018    Past Surgical History:  Procedure Laterality Date   ABDOMINAL HYSTERECTOMY     BACK SURGERY     JOINT REPLACEMENT     knee     OB History   No obstetric history on file.      Home Medications    Prior to Admission medications   Medication Sig Start Date End Date Taking? Authorizing Provider  acetaminophen (TYLENOL) 325 MG tablet Take 2 tablets (650 mg total) by mouth every 6 (six) hours as needed for mild pain or headache. 11/01/18  Yes Sheikh, Omair Latif, DO  albuterol (ACCUNEB) 0.63 MG/3ML nebulizer solution Take 1 ampule by nebulization every 8 (eight) hours as needed for shortness  of breath.   Yes [provider]  amLODipine (NORVASC) 2.5 MG tablet Take 1 tablet (2.5 mg total) by mouth daily. 12/18/18  Yes Everrett Coombe, MD  apixaban (ELIQUIS) 5 MG TABS tablet Take 5 mg by mouth 2 (two) times daily.   Yes [provider]  atorvastatin (LIPITOR) 10 MG tablet Take 10 mg by mouth at bedtime.   Yes [provider]  BREO ELLIPTA 100-25 MCG/INH AEPB Inhale 2 puffs into the lungs every morning. 06/09/19  Yes [provider]  divalproex (DEPAKOTE ER) 250 MG 24 hr tablet Take 250 mg by mouth 2 (two) times a day.   Yes [provider]  escitalopram (LEXAPRO) 10 MG tablet Take 10 mg by mouth daily. 06/22/19  Yes [provider]  fluticasone (FLONASE) 50 MCG/ACT nasal spray Place 1 spray into both nostrils daily. 11/02/18  Yes Sheikh, Omair Latif, DO  gabapentin (NEURONTIN) 100 MG capsule Take 100 mg by mouth 2 (two) times daily.   Yes [provider]  losartan (COZAAR) 100 MG tablet Take 100 mg by mouth daily.    Yes [provider]  Melatonin 5 MG TABS Take 5 mg by mouth at bedtime.   Yes [provider]  Multiple Vitamin (MULTIVITAMIN WITH MINERALS) TABS tablet Take 1 tablet by mouth daily. Centrum Silver   Yes [provider]  naproxen (NAPROSYN) 500 MG tablet Take 500 mg by mouth 2 (two) times daily  with a meal.   Yes [provider]  polyethylene glycol (MIRALAX / GLYCOLAX) packet Take 17 g by mouth daily. 11/01/18  Yes Sheikh, [place], DO  senna-docusate (SENOKOT-S) 8.6-50 MG tablet Take 1 tablet by mouth at bedtime as needed for mild constipation. 11/01/18  Yes Sheikh, Omair Latif, DO  traZODone (DESYREL) 50 MG tablet Take 100 mg by mouth every 6 (six) hours as needed for pain. 05/13/19  Yes [provider]  furosemide (LASIX) 40 MG tablet Take 1 tablet (40 mg total) by mouth daily. Patient not taking: Reported on 07/07/2019 01/03/19 04/03/19  Erlene Quan, PA-C    Family  History Family History  Problem Relation Age of Onset   Hypertension Mother     Social History Social History   Tobacco Use   Smoking status: Never Smoker   Smokeless tobacco: Never Used  Substance Use Topics   Alcohol use: Never    Frequency: Never   Drug use: Never     Allergies   Iodine   Review of Systems Review of Systems  Constitutional: Negative for chills and fever.  HENT: Negative for congestion.   Eyes: Negative for visual disturbance.  Respiratory: Positive for cough and shortness of breath.   Cardiovascular: Negative for chest pain and leg swelling.  Gastrointestinal: Negative for abdominal pain, nausea and vomiting.  Genitourinary: Negative for difficulty urinating, dysuria and frequency.  Musculoskeletal: Negative for myalgias.  Skin: Negative for rash.  Neurological: Negative for dizziness and light-headedness.     Physical Exam Updated Vital Signs Pulse (!) 118    Temp 97.7 F (36.5 C) (Oral)    Resp (!) 37    Ht 4\' 11"  (1.499 m)    Wt 68 kg    SpO2 96%    BMI 30.30 kg/m   Physical Exam Vitals signs and nursing note reviewed.  Constitutional:      Appearance: She is not ill-appearing.  HENT:     Head: Normocephalic and atraumatic.  Eyes:     Conjunctiva/sclera: Conjunctivae normal.  Neck:     Musculoskeletal: Neck supple.  Cardiovascular:     Rate and Rhythm: Tachycardia present. Rhythm irregular.     Pulses: Normal pulses.  Pulmonary:     Effort: Tachypnea present.     Breath sounds: No decreased breath sounds, wheezing, rhonchi or rales.  Abdominal:     Palpations: Abdomen is soft.     Tenderness: There is no abdominal tenderness.  Musculoskeletal:     Comments: Nonpitting edema bilaterally to lower extremities  Skin:    General: Skin is warm and dry.  Neurological:     Mental Status: She is alert.      ED Treatments / Results  Labs (all labs ordered are listed, but only abnormal results are displayed) Labs Reviewed    CBC - Abnormal; Notable for the following components:      Result Value   WBC 11.6 (*)    RDW 18.2 (*)    All other components within normal limits  BRAIN NATRIURETIC PEPTIDE - Abnormal; Notable for the following components:   B Natriuretic Peptide 711.6 (*)    All other components within normal limits  BASIC METABOLIC PANEL - Abnormal; Notable for the following components:   Glucose, Bld 104 (*)    BUN 40 (*)    Creatinine, Ser 1.65 (*)    GFR calc non Af Amer 28 (*)    GFR calc Af Amer 32 (*)    All other  components within normal limits  SARS CORONAVIRUS 2 (HOSPITAL ORDER, [facility])  MAGNESIUM  URINALYSIS, ROUTINE W REFLEX MICROSCOPIC  TROPONIN I (HIGH SENSITIVITY)    EKG EKG Interpretation  Date/Time:  Friday July 07 2019 09:52:53 EDT Ventricular Rate:  99 PR Interval:    QRS Duration: 77 QT Interval:  319 QTC Calculation: 410 R Axis:   75 Text Interpretation:  Atrial fibrillation Probable anterior infarct, old Nonspecific T abnormalities, lateral leads Confirmed by Gerlene Fee [phone]) on 07/07/2019 10:05:47 AM   Radiology Dg Chest Port 1 View  Result Date: 07/07/2019 CLINICAL DATA:  Shortness of breath and dry cough for 1 week. EXAM: PORTABLE CHEST 1 VIEW COMPARISON:  Single-view of the chest 12/15/2018 and/07/2018. PA and lateral chest 11/04/2018. CT chest 11/02/2018. FINDINGS: Marked cardiomegaly. Pulmonary vascular congestion without frank edema is identified. Small bilateral pleural effusions are present, greater on the left. There is left basilar airspace disease. IMPRESSION: Left basilar airspace disease could be due to atelectasis or pneumonia. Cardiomegaly and vascular congestion. Small bilateral pleural effusions, larger on the left. Electronically Signed   By: Inge Rise M.D.   On: 07/07/2019 11:07    Procedures Procedures (including critical care time)  Medications Ordered in ED Medications  acetaminophen (TYLENOL)  tablet 650 mg (has no administration in time range)  amLODipine (NORVASC) tablet 2.5 mg (has no administration in time range)  atorvastatin (LIPITOR) tablet 10 mg (has no administration in time range)  escitalopram (LEXAPRO) tablet 10 mg (has no administration in time range)  polyethylene glycol (MIRALAX / GLYCOLAX) packet 17 g (has no administration in time range)  senna-docusate (Senokot-S) tablet 1 tablet (has no administration in time range)  apixaban (ELIQUIS) tablet 2.5 mg (has no administration in time range)  Melatonin TABS 5 mg (has no administration in time range)  divalproex (DEPAKOTE ER) 24 hr tablet 250 mg (has no administration in time range)  gabapentin (NEURONTIN) capsule 100 mg (has no administration in time range)  albuterol (PROVENTIL) (2.5 MG/3ML) 0.083% nebulizer solution 2.5 mg (has no administration in time range)  fluticasone furoate-vilanterol (BREO ELLIPTA) 100-25 MCG/INH 2 puff (has no administration in time range)  fluticasone (FLONASE) 50 MCG/ACT nasal spray 1 spray (has no administration in time range)  sodium chloride flush (NS) 0.9 % injection 3 mL (has no administration in time range)  ceFEPIme (MAXIPIME) 2 g in sodium chloride 0.9 % 100 mL IVPB (0 g Intravenous Stopped 07/07/19 1255)  vancomycin (VANCOCIN) IVPB 1000 mg/200 mL premix (1,000 mg Intravenous New Bag/Given 07/07/19 1245)  furosemide (LASIX) injection 20 mg (20 mg Intravenous Given 07/07/19 1422)     Initial Impression / Assessment and Plan / ED Course  I have reviewed the triage vital signs and the nursing notes.  Pertinent labs & imaging results that were available during my care of the patient were reviewed by me and considered in my medical decision making (see chart for details).  85 year old female presenting to the ED with complaints of SOB and dry cough x 1 week. Pt found to be in A fib on arrival; heart rate fluctuates between the 90's to 120's. She has a hx of a fib and appears to be in it  chronically; currently does not meet criteria for cardioversion. Pt afebrile in the ED although is tachycardic and tachypneic. Will get baseline bloodwork today including CBC, BMP, BNP, U/A, and trop as well as a CXR. Will reevaluate; if pt's heart rate increases to the 140's  consistently will administer 10 mg Cardizem for rate control.   CXR with atelectasis vs pneumonia; pt also has a mild leukocytosis of 11.6. Will initiate cefepime and vanc given concern for HCAP from SNF. Awaiting remainder of labs including covid test at this time.   Covid negative. BNP elevated about 700 although this appears to be pt's baseline. Mild vascular congestion on xray. Will give 20 mg IV Lasix. Pt will need to be admitted at this time for IV abx. Will consult hospitalist for admission.   2:14 PM Hospitalist accepts.   Clinical Course as of Jul 06 1544  Fri Jul 07, 2019  1132 Concern for PNA  DG Chest Port 1 View [MV]  1132 WBC(!): 11.6 [MV]  1322 Hx chronic kidney disease  Creatinine(!): 1.65 [MV]  1322 B Natriuretic Peptide(!): 711.6 [MV]  6979 Discussed case with hospitalist who agrees to accept for admission   [MV]    Clinical Course User Index [MV] Eustaquio Maize, PA-C         Final Clinical Impressions(s) / ED Diagnoses   Final diagnoses:  HCAP (healthcare-associated pneumonia)  Acute on chronic congestive heart failure, unspecified heart failure type [facility])  Atrial fibrillation, unspecified type [facility])    ED Discharge Orders    None       Eustaquio Maize, PA-C 07/07/19 1545    Maudie Flakes, MD 07/08/19 [phone]

## 2019-07-07 NOTE — ED Triage Notes (Signed)
Pt arrives via EMS from [facility]. Reports SOB and dry cough for the last week . Denies recent fever. Pt alert, oriented x3. Unable to state day of the week. Noted in afiv RVR rate 100-140s. Last bp 157/109.

## 2019-07-07 NOTE — ED Notes (Signed)
RN found pt undressed and walking around the room with all leads and cords off. Pt pulled out her IV as well. Gown changed and put back on monitor as well as purewick. RN replaced IV.

## 2019-07-07 NOTE — H&P (Signed)
[facility] Admission History and Physical Service Pager: [phone]  Patient name: Nicole Barker Medical record number: [id number] Date of birth: Apr 25, 1930 Age: 85 y.o. Gender: female  Primary Care Provider: Merlene Laughter, MD Consultants: n/a Code Status: DNR Preferred Emergency Contact: Daughter, [name] [phone]  Chief Complaint: SOB  Assessment and Plan: Nicole Barker is a 85 y.o. female presenting with SOB w/ new oxygen requirement . PMH is significant for P.Afib (on eliquis), HFrEF 45-50% (echo 2019), dementia (lives at [facility] facility), HTN, HLD, CKD3b-4   SOB: Patient presents from assisted living facility with shortness of breath.  Initially charted as saturations in the upper 90s on room air, but there was some reason admission exam into the 80s on 2 to 3 L although waveforms were not reliable given that patient kept messing with leads.  She was expressing shortness of breath though.  No evident increased work of breathing, no cough on my exam, no stridor, no wheezing, mild crackles at bases.  Differential includes pneumonia(curb score 2) and potential HFrEF exacerbation.  Pneumonia potential given chest x-ray and living in a healthcare facility but less likely given no fever and no productive cough.  Curb65 2.  COVID also negative.  CHF exacerbation is equally likely given small effusion on x-ray and mild bilateral crackles on exam with 1+ pitting edema and BNP of 711.  Status post 20 mg furosemide IV in the emergency department.  Home Lasix regimen is 40 mg p.o. daily. -Admit to cardiac telemetry, Dr. Erin Hearing attending -will evaluate in the AM for continuation of HCAP abx (CTX?) -Strict IxO's Daily weights -Oxygen as needed to keep saturations above 90 -One-time Lasix 20 mg IV scheduled for the morning of July 18  Paroxysmal Afib: Rate 100-120 in emergency department.  Patient with no chest pain or sensation of racing  heart.  Does not appear to be on anything for rate control on medical reconciliation during this admission, although on prior admission was on diltiazem, metoprolol, amiodarone although she did have significant bradycardia which was determined to be iatrogenic at that time.  He is anticoagulated with Eliquis, which was confirmed by daughter -Keep on cardiac monitoring -Eliquis dosing reduced to 2.5 as creatinine is above 1.5 and patient is over 80.  If creatinine improves beyond 1.5 can return to prior 5 mg dose of Eliquis. -If rate control is needed would consider metoprolol first.  Patient has reduced ejection fraction and diltiazem would be last resort choice.  Dementia: Patient is pleasantly demented and told me as she lives in [place] but has been down here visiting her kids for the last few weeks.  Upon speaking to her daughter on the phone, patient has been living in the facility for the last number of years.  She speaks fondly of her kids, daughter seems appropriate on the phone.  [facility] lives at [facility]. -We will DC to SNF when medically cleared -Continue home Depakote, Lexapro -dementia precautions ordered, will attempt sitter>sedation if needed  [id number] -continue home amlodipine  Asthma versus COPD: No actual PFT in chart -continue home inhaled medicaiton  FEN/GI: Heart healthy with fluid restriction 1200 Prophylaxis: Patient is on treatment dose of Eliquis for PAfib  Disposition: Pending medical stabilization, will return to her prior SNF  History of Present Illness:  Nicole Barker is a 85 y.o. female presenting with SOB from her assisted living facility [facility].  Patient is pleasant and conversational although too demented to give a meaningful HPI.  She gave a long narrative about how she lives in [place] and is only been down here for a few weeks visiting her kids with alternating mentions of living with her daughter and with her son.  When  pressed for details or to repeat items of the story a few minutes later they are inconsistent and she does not know details.  She is able to describe her current symptoms.  She looks comfortable sitting in the room, she is slightly confused about where she is but has no increased work of breathing.  She denies any chest pain.  She says she is been feeling "a little down "but that nothing hurts.  She speaks fondly of her children.  Review Of Systems: Per HPI with the following additions:   ROS  Patient Active Problem List   Diagnosis Date Noted  . Pneumonia 07/07/2019  . SOB (shortness of breath) 12/15/2018  . Bradycardia 12/15/2018  . Cardiomyopathy ([place]) 11/22/2018  . Chronic anticoagulation 11/22/2018  . Acute heart failure ([place]) 10/25/2018  . PAF (paroxysmal atrial fibrillation) ([place]) 10/25/2018  . Dementia without behavioral disturbance ([place]) 10/25/2018  . Essential hypertension 10/25/2018  . Hyperlipidemia 10/25/2018  . CRI (chronic renal insufficiency), stage 3 (moderate) ([place]) 10/25/2018    Past Medical History: Past Medical History:  Diagnosis Date  . Anxiety   . Asthma   . Atrial fibrillation ([place])   . Dementia ([place])   . Hyperlipidemia   . Hypertension     Past Surgical History: Past Surgical History:  Procedure Laterality Date  . ABDOMINAL HYSTERECTOMY    . BACK SURGERY    . JOINT REPLACEMENT     knee    Social History: Social History   Tobacco Use  . Smoking status: Never Smoker  . Smokeless tobacco: Never Used  Substance Use Topics  . Alcohol use: Never    Frequency: Never  . Drug use: Never   Additional social history: Daughter confirms she is Marine scientist for her mother and that her mother is DNR.  They also confirmed that the decision is for her to be on Eliquis for A. fib after we discussed risks and benefits briefly. Please also refer to relevant sections of EMR.  Family History: Family History  Problem Relation Age of Onset  . Hypertension  Mother     Allergies and Medications: Allergies  Allergen Reactions  . Iodine Other (See Comments)    Unknown reaction   No current facility-administered medications on file prior to encounter.    Current Outpatient Medications on File Prior to Encounter  Medication Sig Dispense Refill  . acetaminophen (TYLENOL) 325 MG tablet Take 2 tablets (650 mg total) by mouth every 6 (six) hours as needed for mild pain or headache. 30 tablet 0  . albuterol (ACCUNEB) 0.63 MG/3ML nebulizer solution Take 1 ampule by nebulization every 8 (eight) hours as needed for shortness of breath.    Marland Kitchen amLODipine (NORVASC) 2.5 MG tablet Take 1 tablet (2.5 mg total) by mouth daily. 30 tablet 0  . apixaban (ELIQUIS) 5 MG TABS tablet Take 5 mg by mouth 2 (two) times daily.    Marland Kitchen atorvastatin (LIPITOR) 10 MG tablet Take 10 mg by mouth at bedtime.    Marland Kitchen BREO ELLIPTA 100-25 MCG/INH AEPB Inhale 2 puffs into the lungs every morning.    . divalproex (DEPAKOTE ER) 250 MG 24 hr tablet Take 250 mg by mouth 2 (two) times a day.    . escitalopram (LEXAPRO) 10 MG tablet Take 10  mg by mouth daily.    . fluticasone (FLONASE) 50 MCG/ACT nasal spray Place 1 spray into both nostrils daily. 16 g 2  . gabapentin (NEURONTIN) 100 MG capsule Take 100 mg by mouth 2 (two) times daily.    Marland Kitchen losartan (COZAAR) 100 MG tablet Take 100 mg by mouth daily.     . Melatonin 5 MG TABS Take 5 mg by mouth at bedtime.    . Multiple Vitamin (MULTIVITAMIN WITH MINERALS) TABS tablet Take 1 tablet by mouth daily. Centrum Silver    . naproxen (NAPROSYN) 500 MG tablet Take 500 mg by mouth 2 (two) times daily with a meal.    . polyethylene glycol (MIRALAX / GLYCOLAX) packet Take 17 g by mouth daily. 14 each 0  . senna-docusate (SENOKOT-S) 8.6-50 MG tablet Take 1 tablet by mouth at bedtime as needed for mild constipation. 30 tablet 0  . traZODone (DESYREL) 50 MG tablet Take 100 mg by mouth every 6 (six) hours as needed for pain.    . furosemide (LASIX) 40 MG  tablet Take 1 tablet (40 mg total) by mouth daily. (Patient not taking: Reported on 07/07/2019) 30 tablet 0    Objective: BP (!) 136/96   Pulse 91   Temp 97.7 F (36.5 C) (Oral)   Resp (!) 23   Ht 4\' 11"  (1.499 m)   Wt 68 kg   SpO2 97%   BMI 30.30 kg/m  Exam: General: Appears fatigued, no significant distress, pleasantly talkative Eyes: Clear sclera, EOMI ENTM: no hearing deficits noted on exam, no stridor, no epistaxis Cardiovascular: irregular rhythm, tachy ~120, no obvious murmur but heart rate was obscuring sound Respiratory: mild basilar crackles, otherwise good movemetn throughout, no wheezes/cough Gastrointestinal: soft, no TTP MSK: patient was reportedly ambulating around room (demented) prior to my exam and no deficits noted by me Derm: no lesions to exposed skin Neuro: no deficits noted to m exam Psych: aox1 (self), knew kids names but thought she still lived with them, is aware enough to admit she gets confused sometimes  Labs and Imaging: CBC BMET  Recent Labs  Lab 07/07/19 1010  WBC 11.6*  HGB 12.0  HCT 37.7  PLT 259   Recent Labs  Lab 07/07/19 1151  NA 140  K 3.7  CL 104  CO2 23  BUN 40*  CREATININE 1.65*  GLUCOSE 104*  CALCIUM 9.0     EKG: afib w/o rvr on ecg  Sherene Sires, DO 07/07/2019, 4:47 PM PGY-3, [place] Intern pager: [phone], text pages welcome

## 2019-07-07 NOTE — Progress Notes (Signed)
Received pt from the ED (Rayn-RN), pt oriented to room, CCMD called, vital signs obtained, bed in lowest position, call bell within reach.

## 2019-07-07 NOTE — Plan of Care (Signed)
  Problem: Activity: Goal: Capacity to carry out activities will improve Outcome: Progressing   Problem: Cardiac: Goal: Ability to achieve and maintain adequate cardiopulmonary perfusion will improve Outcome: Progressing   

## 2019-07-08 LAB — BASIC METABOLIC PANEL
Anion gap: 12 (ref 5–15)
BUN: 41 mg/dL — ABNORMAL HIGH (ref 8–23)
CO2: 26 mmol/L (ref 22–32)
Calcium: 9.1 mg/dL (ref 8.9–10.3)
Chloride: 103 mmol/L (ref 98–111)
Creatinine, Ser: 1.66 mg/dL — ABNORMAL HIGH (ref 0.44–1.00)
GFR calc Af Amer: 32 mL/min — ABNORMAL LOW (ref >60)
GFR calc non Af Amer: 28 mL/min — ABNORMAL LOW (ref >60)
Glucose, Bld: 91 mg/dL (ref 70–99)
Potassium: 4.3 mmol/L (ref 3.5–5.1)
Sodium: 141 mmol/L (ref 135–145)

## 2019-07-08 LAB — CBC
HCT: 35.4 % — ABNORMAL LOW (ref 36.0–46.0)
Hemoglobin: 11.1 g/dL — ABNORMAL LOW (ref 12.0–15.0)
MCH: 28.8 pg (ref 26.0–34.0)
MCHC: 31.4 g/dL (ref 30.0–36.0)
MCV: 91.7 fL (ref 80.0–100.0)
Platelets: 238 10*3/uL (ref 150–400)
RBC: 3.86 MIL/uL — ABNORMAL LOW (ref 3.87–5.11)
RDW: 17.2 % — ABNORMAL HIGH (ref 11.5–15.5)
WBC: 9.3 10*3/uL (ref 4.0–10.5)
nRBC: 0 % (ref 0.0–0.2)

## 2019-07-08 MED ORDER — FUROSEMIDE 10 MG/ML IJ SOLN
20.0000 mg | Freq: Once | INTRAMUSCULAR | Status: AC
  Administered 2019-07-08: 20 mg via INTRAVENOUS
  Filled 2019-07-08: qty 2

## 2019-07-08 NOTE — Progress Notes (Signed)
Family Medicine Teaching Service Daily Progress Note Intern Pager: [phone]  Patient name: Nicole Barker Medical record number: [id number] Date of birth: Jun 08, 1930 Age: 85 y.o. Gender: female  Primary Care Provider: Merlene Laughter, MD Consultants: n/a Code Status: dnr  Pt Overview and Major Events to Date:  Nicole Barker is a 85 y.o. female presenting with SOB w/ new oxygen requirement . PMH is significant for P.Afib (on eliquis), HFrEF 45-50% (echo 2019), dementia (lives at [facility] facility), HTN, HLD, CKD3b-4  7/17 admitted w/ SOB  Assessment and Plan: SOB significantly improved: comfortable on 2L with high sats, no cough, afebrile  Now more likely resolving CHF than pnuemonia.  still with 1+ pitting edema.    Home Lasix regimen is 40 mg p.o. daily. -stop abx -Strict IxO's Daily weights -Oxygen as needed to keep saturations above 90 -second dose of lasix 7/18 and then return to home regimen  Paroxysmal Afib: rate 90s-110s, patient comfortable.  Patient with no chest pain or sensation of racing heart.  Discussed regimen with daughter, is not on anything for rate control at facility, although on prior admission was on diltiazem, metoprolol, amiodarone.  This was stopped because of symptomatic bradycardia, daughter says she has been stable on no rate control and would prefer to stay with that plan as it has worked well for a long time.  sHe is anticoagulated with Eliquis, which was confirmed by daughter -Keep on cardiac monitoring -Eliquis dosing reduced to 2.5 as creatinine is above 1.5 and patient is over 80.   Discussed with daughter and she agrees with plan to d/c on 2.5 if this appears to be new renal baseline -daughter agrees with refraining from rate control at this point as mom has been stable without it for so long  Dementia: Patient is pleasantly demented, speaks fondly of daughter, she is comfrtable and happy in her room.  She lives at [facility]. -We will DC to SNF when medically cleared -Continue home Depakote, Lexapro -dementia precautions ordered, will attempt sitter>sedation if needed  GGY:[id number] -continue home amlodipine  Asthma versus COPD: No actual PFT in chart -continue home inhaled medicaiton  FEN/GI: Heart healthy with fluid restriction 1200 Prophylaxis: Patient is on treatment dose of Eliquis for PAfib  Disposition: Pending medical stabilization, will return to her prior SNF  Subjective:  patint is pleasantly demented, no pain, no IWB.  Daughter comfortable with plan as discussed on phone  Objective: Temp:  [97.5 F (36.4 C)-98.1 F (36.7 C)] 97.5 F (36.4 C) (07/18 1159) Pulse Rate:  [36-117] 104 (07/18 1159) Resp:  [18-31] 20 (07/18 1159) BP: (87-136)/(68-101) 132/91 (07/18 1159) SpO2:  [95 %-100 %] 96 % (07/18 1159) Weight:  [71.7 kg-72.3 kg] 72.3 kg (07/18 0416) Physical Exam: General: comfortbale on 2L [place], no distress, pleasantly demented Cardiovascular: irregular rhythm ~90-100/minute, 1+ edema Respiratory: 2Lnc, no distress, no wheezes/crackles, no IWB no cough Abdomen: soft Extremities: 1+edema in legs, able to move all limbs at will  Laboratory: Recent Labs  Lab 07/07/19 1010 07/08/19 0812  WBC 11.6* 9.3  HGB 12.0 11.1*  HCT 37.7 35.4*  PLT 259 238   Recent Labs  Lab 07/07/19 1151 07/08/19 0812  NA 140 141  K 3.7 4.3  CL 104 103  CO2 23 26  BUN 40* 41*  CREATININE 1.65* 1.66*  CALCIUM 9.0 9.1  GLUCOSE 104* 91     Imaging/Diagnostic Tests: Dg Chest Port 1 View  Result Date: 07/07/2019 CLINICAL DATA:  Shortness of breath and dry  cough for 1 week. EXAM: PORTABLE CHEST 1 VIEW COMPARISON:  Single-view of the chest 12/15/2018 and/07/2018. PA and lateral chest 11/04/2018. CT chest 11/02/2018. FINDINGS: Marked cardiomegaly. Pulmonary vascular congestion without frank edema is identified. Small bilateral pleural effusions are present, greater on the left. There is  left basilar airspace disease. IMPRESSION: Left basilar airspace disease could be due to atelectasis or pneumonia. Cardiomegaly and vascular congestion. Small bilateral pleural effusions, larger on the left. Electronically Signed   By: Inge Rise M.D.   On: 07/07/2019 11:07     Sherene Sires, DO 07/08/2019, 12:41 PM PGY-3, [place] Intern pager: [phone], text pages welcome

## 2019-07-08 NOTE — Progress Notes (Signed)
Paged by RN about bradycardia showing in the 20s and 30s on the monitor.  Patient was asymptomatic by verbal complaint but that is questionable at best given patient's advanced dementia.  Went up to the room to evaluate the patient.  She was comfortable and still pleasant in no obvious distress, monitor was actually showing heart rates in the 20s to 30s but upon auscultation heart rate was actually closer to 70.  I went to get the RN to help verify with me that we both agreed her heart rate was in the 70s, auscultation for her showed the same, as did pulses palpated at the wrist.  -Monitor was incorrect, heart rate was not in the 20s.  -No intervention needed at this time, even if patient had been bradycardia into the 20s and 30s, she is not on any rate control medication and most likely cause would be tachybradycardia syndrome.  Patient would be a poor candidate for pacemaker which does not elongate life and only treat symptoms.  Dr. Criss Rosales

## 2019-07-08 NOTE — Progress Notes (Signed)
Reassessment of pt following albuterol treatment showed improvement in wheezing, clearer lung sounds. Pt still dependent an oxygen with desaturation occurring at 1-2 min on room air (88-89% O2) necessitating placing pt back on 2L [place] (sats 97-100 %).  Dynamap machine was indicating a HR in the 20's-30's paged Dr. Criss Rosales, DO, who came bedside to assess patient.  Both myself and Dr. Criss Rosales assessed HR by auscutation and radial pulse which yielded a HR 60-70.

## 2019-07-08 NOTE — Progress Notes (Signed)
Albuterol PRN treatment performed at 1358 for shortness of breath and wheezing, Oxygen sats 100 % at 2L.  Still some expiratory wheezing but improved post- albuterol treatment.    Nurse at bedside.

## 2019-07-08 NOTE — Progress Notes (Addendum)
Patient called out with shortness of breath, wheezing, unable to breathe, oxygen sats 89 % RA, placed back on 2 L [place] oxygen sats 98 %.  Breathing treatment performed alburerol PRN treatment and will reassess breathing.  Unable to wean pt to RA at this time.

## 2019-07-08 NOTE — Progress Notes (Signed)
Pt took over oxygen earlier this morning but reported "I can't breathe" ~ 1200.  Oxygen sats 95 % RA, 98 %  2L O2.  Kept pt on 2L [place] for comfort.  Will attempt to wean off oxygen providde sats >92 %.  Will attempt continuous pulse ox but pt confuse/agitated and pulling lines and wires.

## 2019-07-09 LAB — CBC
HCT: 33.6 % — ABNORMAL LOW (ref 36.0–46.0)
Hemoglobin: 10.4 g/dL — ABNORMAL LOW (ref 12.0–15.0)
MCH: 28.4 pg (ref 26.0–34.0)
MCHC: 31 g/dL (ref 30.0–36.0)
MCV: 91.8 fL (ref 80.0–100.0)
Platelets: 226 10*3/uL (ref 150–400)
RBC: 3.66 MIL/uL — ABNORMAL LOW (ref 3.87–5.11)
RDW: 17.2 % — ABNORMAL HIGH (ref 11.5–15.5)
WBC: 9.2 10*3/uL (ref 4.0–10.5)
nRBC: 0 % (ref 0.0–0.2)

## 2019-07-09 LAB — PHOSPHORUS: Phosphorus: 3.9 mg/dL (ref 2.5–4.6)

## 2019-07-09 LAB — BASIC METABOLIC PANEL
Anion gap: 10 (ref 5–15)
BUN: 38 mg/dL — ABNORMAL HIGH (ref 8–23)
CO2: 26 mmol/L (ref 22–32)
Calcium: 8.9 mg/dL (ref 8.9–10.3)
Chloride: 103 mmol/L (ref 98–111)
Creatinine, Ser: 1.39 mg/dL — ABNORMAL HIGH (ref 0.44–1.00)
GFR calc Af Amer: 40 mL/min — ABNORMAL LOW (ref >60)
GFR calc non Af Amer: 34 mL/min — ABNORMAL LOW (ref >60)
Glucose, Bld: 89 mg/dL (ref 70–99)
Potassium: 4 mmol/L (ref 3.5–5.1)
Sodium: 139 mmol/L (ref 135–145)

## 2019-07-09 MED ORDER — APIXABAN 2.5 MG PO TABS: 2.5000 mg | ORAL_TABLET | Freq: Two times a day (BID) | ORAL | Status: AC

## 2019-07-09 MED ORDER — DIVALPROEX SODIUM 250 MG PO DR TAB: 250.0000 mg | DELAYED_RELEASE_TABLET | Freq: Two times a day (BID) | ORAL | Status: DC

## 2019-07-09 MED ORDER — BUSPIRONE HCL 5 MG PO TABS: 5.0000 mg | ORAL_TABLET | Freq: Three times a day (TID) | ORAL | Status: DC | PRN

## 2019-07-09 NOTE — Discharge Summary (Signed)
[facility] Discharge Summary  Patient name: Nicole Barker Medical record number: [id number] Date of birth: 08/29/1930 Age: 85 y.o. Gender: female Date of Admission: 07/07/2019  Date of Discharge: 07/09/19 Admitting Physician: Provider Default, MD  Primary Care Provider: Merlene Laughter, MD Consultants: n/a  Indication for Hospitalization: Respiratory distress likely secondary to CHF exacerbation  Discharge Diagnoses/Problem List:  HFpEF Paroxysmal A. Fib on anticoagulation Advanced dementia Hypertension Anxiety  Disposition: To her prior nursing facility  Discharge Condition: Stable  Discharge Exam:  General: Comfortable on 2 L nasal cannula, anxiety and subjective shortness of breath without it.  No objective distress Cardiovascular: Regular rhythm, heart rate has varied between 70 and 110 Respiratory: Clear to auscultation bilaterally, no wheezes, no cough, no increased work of breathing.  Saturations at 96% on room air Abdomen: Soft, no tenderness palpation Extremities: 1+edema in legs, able to move all limbs at will  Brief Hospital Course:  Patient presented to the emergency department in respiratory distress.  She had reported desaturations into the 80s requiring 3 L nasal cannula.  She was pleasantly demented and unable to give a good medical history herself.  We did call her daughter who was able to relay what information to be given to her by the facility.  Initial work-up included a diagnosis differential of pneumonia versus mild CHF exacerbation.  She was on antibiotics overnight and gently diuresed with a rapid improvement to objective good saturations on room air.  Given that we stopped antibiotic treatment in regard to this is a CHF exacerbation.  Patient continued to have subjective anxiety related feelings of shortness of breath.  Monitoring seem to show that she was saturating well during these times although she would often end up  back on nasal cannula for comfort.  As we cannot discharge with oxygen for comfort, we are prescribing BuSpar to help treat the anxiety that comes with these subjective feelings of shortness of breath.  We also discussed with the family a lowering of the apixaban to 2.5 mg from the current 5 mg.  This comes because the patient's age and slightly worsening kidney function places her in the category with a lower dose was appropriate, you may find that she has recovery in kidney function which could place her appropriate for the 5 mg dose.  Rate control medications were discussed with the family and after their opinion on her prior negative response to rate control medications as well as chart review showing significant and symptomatic bradycardia when on rate control we do not believe that adding a rate control medication is appropriate this time.  It should be noted that though her heart rate often fluctuated from 60-110, she was never symptomatic to our exam and looked comfortable.  We believe it is likely a component of tachybradycardia syndrome associated with her long-term A. fib.  Issues for Follow Up:  1. Patient presented initially with legitimate objective shortness of breath which resolved quickly with some light diuresis.  She did persist however with subjective shortness of breath while she still had good oxygen saturation.  We are prescribing BuSpar 5 mg as needed 3 times today to treat the anxiety associated with this subjective feeling.  She also responded well to distraction given her short-term memory issues.  This new BuSpar medication has been discussed with her daughter. 2. We have suggested holding her amlodipine and losartan given her reasonably low blood pressures that did not seem to require further medication.  You may find that  if she returns to her diet and schedule a your facility that she could require adding these back 3.     We found that her kidney function seem to have worsened  slightly to a creatinine above 1.5.  Guidelines state that this and her age indicates she could be on a lower dose of apixaban and so we have suggested a 2.5 mg dose instead of the prior 5 mg dose.  We discussed this with the daughter and she understands. 4.  We did not prescribe trazodone while she was in the hospital.  She did not seem to need this medicine while she was here and it may have a large sedative effect on her given the dose.  You may find that you need to add it back as appropriate.  Significant Procedures: none  Significant Labs and Imaging:  Recent Labs  Lab 07/07/19 1010 07/08/19 0812 07/09/19 0625  WBC 11.6* 9.3 9.2  HGB 12.0 11.1* 10.4*  HCT 37.7 35.4* 33.6*  PLT 259 238 226   Recent Labs  Lab 07/07/19 1151 07/08/19 0812 07/09/19 0625  NA 140 141 139  K 3.7 4.3 4.0  CL 104 103 103  CO2 23 26 26   GLUCOSE 104* 91 89  BUN 40* 41* 38*  CREATININE 1.65* 1.66* 1.39*  CALCIUM 9.0 9.1 8.9  MG 2.1  --   --   PHOS  --   --  3.9    Dg Chest Port 1 View  Result Date: 07/07/2019 CLINICAL DATA:  Shortness of breath and dry cough for 1 week. EXAM: PORTABLE CHEST 1 VIEW COMPARISON:  Single-view of the chest 12/15/2018 and/07/2018. PA and lateral chest 11/04/2018. CT chest 11/02/2018. FINDINGS: Marked cardiomegaly. Pulmonary vascular congestion without frank edema is identified. Small bilateral pleural effusions are present, greater on the left. There is left basilar airspace disease. IMPRESSION: Left basilar airspace disease could be due to atelectasis or pneumonia. Cardiomegaly and vascular congestion. Small bilateral pleural effusions, larger on the left. Electronically Signed   By: Inge Rise M.D.   On: 07/07/2019 11:07    Results/Tests Pending at Time of Discharge:   Discharge Medications:  Allergies as of 07/09/2019      Reactions   Iodine Other (See Comments)   Unknown reaction      Medication List    STOP taking these medications   amLODipine 2.5 MG  tablet Commonly known as: NORVASC   losartan 100 MG tablet Commonly known as: COZAAR   traZODone 50 MG tablet Commonly known as: DESYREL     TAKE these medications   acetaminophen 325 MG tablet Commonly known as: TYLENOL Take 2 tablets (650 mg total) by mouth every 6 (six) hours as needed for mild pain or headache.   albuterol 0.63 MG/3ML nebulizer solution Commonly known as: ACCUNEB Take 1 ampule by nebulization every 8 (eight) hours as needed for shortness of breath.   apixaban 2.5 MG Tabs tablet Commonly known as: ELIQUIS Take 1 tablet (2.5 mg total) by mouth 2 (two) times daily. What changed:   medication strength  how much to take   atorvastatin 10 MG tablet Commonly known as: LIPITOR Take 10 mg by mouth at bedtime.   Breo Ellipta 100-25 MCG/INH Aepb Generic drug: fluticasone furoate-vilanterol Inhale 2 puffs into the lungs every morning.   busPIRone 5 MG tablet Commonly known as: BUSPAR Take 1 tablet (5 mg total) by mouth 3 (three) times daily as needed (for anxiety (frequent subjective SOB)).   divalproex 250  MG DR tablet Commonly known as: DEPAKOTE Take 250 mg by mouth 2 (two) times daily. What changed: Another medication with the same name was removed. Continue taking this medication, and follow the directions you see here.   escitalopram 10 MG tablet Commonly known as: LEXAPRO Take 10 mg by mouth daily.   fluticasone 50 MCG/ACT nasal spray Commonly known as: FLONASE Place 1 spray into both nostrils daily.   furosemide 40 MG tablet Commonly known as: LASIX Take 1 tablet (40 mg total) by mouth daily.   gabapentin 100 MG capsule Commonly known as: NEURONTIN Take 100 mg by mouth 2 (two) times daily.   Melatonin 5 MG Tabs Take 5 mg by mouth at bedtime.   multivitamin with minerals Tabs tablet Take 1 tablet by mouth daily. Centrum Silver   naproxen 500 MG tablet Commonly known as: NAPROSYN Take 500 mg by mouth 2 (two) times daily with a meal.    polyethylene glycol 17 g packet Commonly known as: MIRALAX / GLYCOLAX Take 17 g by mouth daily.   senna-docusate 8.6-50 MG tablet Commonly known as: Senokot-S Take 1 tablet by mouth at bedtime as needed for mild constipation.       Discharge Instructions: Please refer to Patient Instructions section of EMR for full details.  Patient was counseled important signs and symptoms that should prompt return to medical care, changes in medications, dietary instructions, activity restrictions, and follow up appointments.   Follow-Up Appointments:   Sherene Sires, DO 07/09/2019, 2:13 PM PGY-3, [place]

## 2019-07-09 NOTE — Progress Notes (Signed)
Patient transported by [facility] to [facility].

## 2019-07-09 NOTE — Progress Notes (Signed)
Family Medicine Teaching Service Daily Progress Note Intern Pager: [phone]  Patient name: Nicole Barker Medical record number: [id number] Date of birth: [birth year] Age: 85 y.o. Gender: female  Primary Care Provider: Merlene Laughter, MD Consultants: n/a Code Status: dnr  Pt Overview and Major Events to Date:  Nicole Barker is a 85 y.o. female presenting with SOB w/ new oxygen requirement . PMH is significant for P.Afib (on eliquis), HFrEF 45-50% (echo 2019), dementia (lives at [facility] facility), HTN, HLD, CKD3b-4  7/17 admitted w/ SOB  Assessment and Plan: Subjectively SOB, but  objectively significantly improved: satting 96 on RA but expressing shortness of breath which leads to anxiety/tachycardia.  When placed on 1L there is immediate calming with no change in sats but HR resolving and patient expressing she feels better. no cough, afebrile  still with 1+ pitting edema.    Home Lasix regimen is 40 mg p.o. daily. -may not medically qualify for O2 via saturations but patient is emotionally stressed without it which influences tachycardia -Strict IxO's Daily weights -Oxygen as needed to keep saturations above 90 -second dose of lasix 7/18 and then return to home regimen  Paroxysmal Afib: rate 70-80s when on O2, increases w/ anxiety to 90-110 when taken off room air with patient expressing SOBbut no chest pain.  Discussed regimen with daughter, is not on anything for rate control at facility, although on prior admission was on diltiazem, metoprolol, amiodarone.  This was stopped because of symptomatic bradycardia, daughter says she has been stable on no rate control and would prefer to stay with that plan as it has worked well for a long time.  sHe is anticoagulated with Eliquis, which was confirmed by daughter -Eliquis dosing reduced to 2.5 as creatinine is above 1.5 and patient is over 80.   Discussed with daughter and she agrees with plan to d/c on 2.5 if this  appears to be new renal baseline -daughter agrees with refraining from rate control at this point as mom has been stable without it for so long  Dementia: stable.  Patient is pleasantly demented, speaks fondly of daughter, she is comfrtable and happy in her room.  She lives at [facility]. -We will DC to SNF when medically cleared -Continue home Depakote, Lexapro -dementia precautions ordered, will attempt sitter>sedation if needed  DXA:JOINOMVEHM -continue home amlodipine  Asthma versus COPD: No actual PFT in chart -continue home inhaled medicaiton  FEN/GI: Heart healthy with fluid restriction 1200 Prophylaxis: Patient is on treatment dose of Eliquis for PAfib  Disposition: pending determination on O2, will return to her prior SNF  Subjective:  Pleasant, no pain expressed, subjective SOB when not on O2  Objective: Temp:  [97.5 F (36.4 C)-98 F (36.7 C)] 98 F (36.7 C) (07/19 0508) Pulse Rate:  [36-117] 97 (07/19 0508) Resp:  [18-20] 18 (07/19 0508) BP: (87-134)/(68-91) 112/73 (07/19 0508) SpO2:  [88 %-100 %] 100 % (07/19 0508) Weight:  [71.9 kg] 71.9 kg (07/19 0500) Physical Exam: General: Comfortable on 2 L nasal cannula, anxiety and subjective shortness of breath without it.  No objective distress Cardiovascular: Regular rhythm, heart rate has varied between 70 and 110 Respiratory: Clear to auscultation bilaterally, no wheezes, no cough, no increased work of breathing.  Saturations at 96% on room air Abdomen: Soft, no tenderness palpation Extremities: 1+edema in legs, able to move all limbs at will  Laboratory: Recent Labs  Lab 07/07/19 1010 07/08/19 0812  WBC 11.6* 9.3  HGB 12.0 11.1*  HCT 37.7 35.4*  PLT 259 238   Recent Labs  Lab 07/07/19 1151 07/08/19 0812  NA 140 141  K 3.7 4.3  CL 104 103  CO2 23 26  BUN 40* 41*  CREATININE 1.65* 1.66*  CALCIUM 9.0 9.1  GLUCOSE 104* 91     Imaging/Diagnostic Tests: Dg Chest Port 1 View  Result  Date: 07/07/2019 CLINICAL DATA:  Shortness of breath and dry cough for 1 week. EXAM: PORTABLE CHEST 1 VIEW COMPARISON:  Single-view of the chest 12/15/2018 and/07/2018. PA and lateral chest 11/04/2018. CT chest 11/02/2018. FINDINGS: Marked cardiomegaly. Pulmonary vascular congestion without frank edema is identified. Small bilateral pleural effusions are present, greater on the left. There is left basilar airspace disease. IMPRESSION: Left basilar airspace disease could be due to atelectasis or pneumonia. Cardiomegaly and vascular congestion. Small bilateral pleural effusions, larger on the left. Electronically Signed   By: Inge Rise M.D.   On: 07/07/2019 11:07     Sherene Sires, DO 07/09/2019, 7:07 AM PGY-3, [place] Intern pager: [phone], text pages welcome

## 2019-07-09 NOTE — TOC Initial Note (Signed)
Transition of Care [facility]) - Initial/Assessment Note    Patient Details  Name: Nicole Barker MRN: [id number] Date of Birth: 1930/11/20  [facility] CM/SW Contact:    Bary Castilla, LCSW Phone Number: [phone] 07/09/2019, 2:37 PM  Clinical Narrative:    CSW received call from MD earlier stating that patient was medically ready for discharge and was from [place]. MD wanted to know if she could return today.  CSW reached out [facility] and they stated that she could return today with discharge summary.  CSW will continue to follow for discharge planning.                      Patient Goals and CMS Choice        Expected Discharge Plan and Services           Expected Discharge Date: 07/09/19                                    Prior Living Arrangements/Services                       Activities of Daily Living Home Assistive Devices/Equipment: Bedside commode/3-in-1 ADL Screening (condition at time of admission) Patient's cognitive ability adequate to safely complete daily activities?: Yes Is the patient deaf or have difficulty hearing?: No Does the patient have difficulty seeing, even when wearing glasses/contacts?: Yes Does the patient have difficulty concentrating, remembering, or making decisions?: Yes Patient able to express need for assistance with ADLs?: Yes Does the patient have difficulty dressing or bathing?: Yes Independently performs ADLs?: No Toileting: Needs assistance Does the patient have difficulty walking or climbing stairs?: Yes Weakness of Legs: Both Weakness of Arms/Hands: None  Permission Sought/Granted                  Emotional Assessment              Admission diagnosis:  HCAP (healthcare-associated pneumonia) [J18.9] Atrial fibrillation, unspecified type ([place]) [I48.91] Acute on chronic congestive heart failure, unspecified heart failure type ([place]) [I50.9] Patient Active Problem List   Diagnosis Date Noted  . Pneumonia 07/07/2019  . Acute on chronic congestive heart failure ([place])   . SOB (shortness of breath) 12/15/2018  . Bradycardia 12/15/2018  . Cardiomyopathy ([place]) 11/22/2018  . Chronic anticoagulation 11/22/2018  . Acute heart failure ([place]) 10/25/2018  . PAF (paroxysmal atrial fibrillation) ([place]) 10/25/2018  . Dementia without behavioral disturbance ([place]) 10/25/2018  . Essential hypertension 10/25/2018  . Hyperlipidemia 10/25/2018  . CRI (chronic renal insufficiency), stage 3 (moderate) ([place]) 10/25/2018   PCP:  Merlene Laughter, MD Pharmacy:  No Pharmacies Listed    Social Determinants of Health (SDOH) Interventions    Readmission Risk Interventions No flowsheet data found.

## 2019-07-09 NOTE — Progress Notes (Signed)
Report given to Glennie Hawk [phone] room#6. DC packet on chart. Ambulance transport requested for patient. IV removed prior by pt, Telemetry already d/c by MD.

## 2019-07-09 NOTE — TOC Transition Note (Signed)
Transition of Care [facility]) - CM/SW Discharge Note   Patient Details  Name: Malea Swilling MRN: [id number] Date of Birth: 04/15/1930  [facility] CM/SW Contact:  Bary Castilla, LCSW Phone Number: [phone] 07/09/2019, 2:39 PM   Clinical Narrative:      Patient will DC to: [facility]? Anticipated DC date:?07/09/19 Family notified:?By MD- daughter Transport BX:IDHW   Per MD patient ready for DC to [facility]. RN, patient, patient's family, and facility notified of DC. Discharge Summary sent to facility. RN given number for report [phone] room#6. DC packet on chart. Ambulance transport requested for patient.  CSW signing off.   [place], [place] [phone]           Patient Goals and CMS Choice        Discharge Placement                       Discharge Plan and Services                                     Social Determinants of Health (SDOH) Interventions     Readmission Risk Interventions No flowsheet data found.

## 2019-07-23 ENCOUNTER — Encounter (HOSPITAL_COMMUNITY): Payer: Self-pay

## 2019-07-23 ENCOUNTER — Other Ambulatory Visit: Payer: Self-pay

## 2019-07-23 ENCOUNTER — Emergency Department (HOSPITAL_COMMUNITY)
Admission: EM | Admit: 2019-07-23 | Discharge: 2019-07-23 | Disposition: A | Discharge status: Home / Self Care | Payer: Medicare Other | Attending: Emergency Medicine | Admitting: Emergency Medicine

## 2019-07-23 ENCOUNTER — Emergency Department (HOSPITAL_COMMUNITY): Payer: Medicare Other

## 2019-07-23 DIAGNOSIS — I10 Essential (primary) hypertension: Secondary | ICD-10-CM | POA: Diagnosis not present

## 2019-07-23 DIAGNOSIS — Z79899 Other long term (current) drug therapy: Secondary | ICD-10-CM | POA: Diagnosis not present

## 2019-07-23 DIAGNOSIS — W19XXXA Unspecified fall, initial encounter: Secondary | ICD-10-CM | POA: Diagnosis not present

## 2019-07-23 DIAGNOSIS — Z043 Encounter for examination and observation following other accident: Secondary | ICD-10-CM | POA: Diagnosis present

## 2019-07-23 DIAGNOSIS — F0390 Unspecified dementia without behavioral disturbance: Secondary | ICD-10-CM | POA: Insufficient documentation

## 2019-07-23 DIAGNOSIS — Z7901 Long term (current) use of anticoagulants: Secondary | ICD-10-CM | POA: Diagnosis not present

## 2019-07-23 NOTE — ED Notes (Signed)
[facility] called for transport.  

## 2019-07-23 NOTE — ED Triage Notes (Signed)
Bib ems form [facility], unwitnessed fall found laying on right side rolled to back. No noted injuries  [place] and very lethargic. Ems states history of Afib and blood thinners. Unknown if Loc.

## 2019-07-23 NOTE — ED Notes (Signed)
ED Provider at bedside. 

## 2019-07-23 NOTE — ED Provider Notes (Signed)
[facility] DEPT Provider Note   CSN: [id number] Arrival date & time: 07/23/19  1527    History   Chief Complaint Chief Complaint  Patient presents with   Fall    HPI Nicole Barker is a 85 y.o. female.     HPI Patient brought in by EMS after being found in the floor by nursing home staff.  Unwitnessed fall.  Patient is on Eliquis for atrial fibrillation.  Patient has history of dementia and currently is complaining of no symptoms.  She not able to give a adequate history of the fall.  Level 5 caveat applies. Past Medical History:  Diagnosis Date   Anxiety    Asthma    Atrial fibrillation ([place])    Dementia ([place])    Hyperlipidemia    Hypertension     Patient Active Problem List   Diagnosis Date Noted   Pneumonia 07/07/2019   Acute on chronic congestive heart failure ([place])    SOB (shortness of breath) 12/15/2018   Bradycardia 12/15/2018   Cardiomyopathy ([place]) 11/22/2018   Chronic anticoagulation 11/22/2018   Acute heart failure ([place]) 10/25/2018   PAF (paroxysmal atrial fibrillation) ([place]) 10/25/2018   Dementia without behavioral disturbance ([place]) 10/25/2018   Essential hypertension 10/25/2018   Hyperlipidemia 10/25/2018   CRI (chronic renal insufficiency), stage 3 (moderate) ([place]) 10/25/2018    Past Surgical History:  Procedure Laterality Date   ABDOMINAL HYSTERECTOMY     BACK SURGERY     JOINT REPLACEMENT     knee     OB History   No obstetric history on file.      Home Medications    Prior to Admission medications   Medication Sig Start Date End Date Taking? Authorizing Provider  acetaminophen (TYLENOL) 325 MG tablet Take 2 tablets (650 mg total) by mouth every 6 (six) hours as needed for mild pain or headache. 11/01/18   Sheikh, Georgina Quint Latif, DO  albuterol (ACCUNEB) 0.63 MG/3ML nebulizer solution Take 1 ampule by nebulization every 8 (eight) hours as needed for shortness of breath.    [provider]  apixaban (ELIQUIS) 2.5 MG TABS tablet Take 1 tablet (2.5 mg total) by mouth 2 (two) times daily. 07/09/19   Sherene Sires, DO  atorvastatin (LIPITOR) 10 MG tablet Take 10 mg by mouth at bedtime.    [provider]  BREO ELLIPTA 100-25 MCG/INH AEPB Inhale 2 puffs into the lungs every morning. 06/09/19   [provider]  busPIRone (BUSPAR) 5 MG tablet Take 1 tablet (5 mg total) by mouth 3 (three) times daily as needed (for anxiety (frequent subjective SOB)). 07/09/19   Sherene Sires, DO  divalproex (DEPAKOTE) 250 MG DR tablet Take 250 mg by mouth 2 (two) times daily.    [provider]  escitalopram (LEXAPRO) 10 MG tablet Take 10 mg by mouth daily. 06/22/19   [provider]  fluticasone (FLONASE) 50 MCG/ACT nasal spray Place 1 spray into both nostrils daily. 11/02/18   Raiford Noble Latif, DO  furosemide (LASIX) 40 MG tablet Take 1 tablet (40 mg total) by mouth daily. Patient not taking: Reported on 07/07/2019 01/03/19 04/03/19  Erlene Quan, PA-C  gabapentin (NEURONTIN) 100 MG capsule Take 100 mg by mouth 2 (two) times daily.    [provider]  Melatonin 5 MG TABS Take 5 mg by mouth at bedtime.    [provider]  Multiple Vitamin (MULTIVITAMIN WITH MINERALS) TABS tablet Take 1 tablet by mouth daily. Centrum Silver  [provider]  naproxen (NAPROSYN) 500 MG tablet Take 500 mg by mouth 2 (two) times daily with a meal.    [provider]  polyethylene glycol (MIRALAX / GLYCOLAX) packet Take 17 g by mouth daily. 11/01/18   Sheikh, Omair Latif, DO  senna-docusate (SENOKOT-S) 8.6-50 MG tablet Take 1 tablet by mouth at bedtime as needed for mild constipation. 11/01/18   Kerney Elbe, DO    Family History Family History  Problem Relation Age of Onset   Hypertension Mother     Social History Social History   Tobacco Use   Smoking status: Never Smoker   Smokeless tobacco: Never Used  Substance Use  Topics   Alcohol use: Never    Frequency: Never   Drug use: Never     Allergies   Iodine   Review of Systems Review of Systems  Unable to perform ROS: Dementia     Physical Exam Updated Vital Signs BP (!) 110/56    Pulse 96    Temp 97.7 F (36.5 C)    Resp 20    SpO2 93%   Physical Exam Vitals signs and nursing note reviewed.  Constitutional:      General: She is not in acute distress.    Appearance: Normal appearance. She is well-developed. She is not ill-appearing.  HENT:     Head: Normocephalic and atraumatic.     Comments: No obvious head trauma.    Nose: Nose normal.     Mouth/Throat:     Mouth: Mucous membranes are moist.  Eyes:     Extraocular Movements: Extraocular movements intact.     Pupils: Pupils are equal, round, and reactive to light.  Neck:     Comments: Cervical collar in place. Cardiovascular:     Rate and Rhythm: Tachycardia present. Rhythm irregular.  Pulmonary:     Effort: Pulmonary effort is normal. No respiratory distress.     Breath sounds: Normal breath sounds. No stridor. No wheezing, rhonchi or rales.  Chest:     Chest wall: No tenderness.  Abdominal:     General: Bowel sounds are normal.     Palpations: Abdomen is soft.     Tenderness: There is no abdominal tenderness. There is no guarding or rebound.  Musculoskeletal: Normal range of motion.        General: No swelling, tenderness, deformity or signs of injury.     Right lower leg: No edema.     Left lower leg: No edema.     Comments: Pelvis is stable.  Moving all extremities without decreased range of motion.  Skin:    General: Skin is warm and dry.     Capillary Refill: Capillary refill takes less than 2 seconds.     Findings: No erythema or rash.  Neurological:     Mental Status: She is alert.     Comments: Confused.  5/5 motor in all extremities.  Sensation intact.      ED Treatments / Results  Labs (all labs ordered are listed, but only abnormal results are  displayed) Labs Reviewed - No data to display  EKG None  Radiology Ct Head Wo Contrast  Result Date: 07/23/2019 CLINICAL DATA:  Unwitnessed fall.  Lethargic.  On blood thinners EXAM: CT HEAD WITHOUT CONTRAST CT CERVICAL SPINE WITHOUT CONTRAST TECHNIQUE: Multidetector CT imaging of the head and cervical spine was performed following the standard protocol without intravenous contrast. Multiplanar CT image reconstructions of the cervical spine were also generated. COMPARISON:  None. FINDINGS: CT HEAD FINDINGS Initial images were degraded by patient motion. Exam repeated with improvement. Brain: No acute intracranial hemorrhage. No focal mass lesion. No CT evidence of acute infarction. No midline shift or mass effect. No hydrocephalus. Basilar cisterns are patent. There are periventricular and subcortical white matter hypodensities. Generalized cortical atrophy. Vascular: No hyperdense vessel or unexpected calcification. Skull: Normal. Negative for fracture or focal lesion. Sinuses/Orbits: Paranasal sinuses and mastoid air cells are clear. Orbits are clear. Other: None. CT CERVICAL SPINE FINDINGS Alignment: Normal alignment of the cervical vertebral bodies. Skull base and vertebrae: Normal craniocervical junction. No loss of vertebral body height or disc height. Normal facet articulation. No evidence of fracture. Soft tissues and spinal canal: No prevertebral soft tissue swelling. No perispinal or epidural hematoma. Disc levels: Multiple levels of endplate spurring no subluxation. Mild disc space narrowing in the lower cervical spine. Upper chest: Clear Other: None IMPRESSION: 1. No intracranial trauma. 2. Chronic atrophy white matter microvascular disease. 3. No cervical spine fracture. 4. Multilevel disc osteophytic disease. Electronically Signed   By: Suzy Bouchard M.D.   On: 07/23/2019 17:05   Ct Cervical Spine Wo Contrast  Result Date: 07/23/2019 CLINICAL DATA:  Unwitnessed fall.  Lethargic.  On blood  thinners EXAM: CT HEAD WITHOUT CONTRAST CT CERVICAL SPINE WITHOUT CONTRAST TECHNIQUE: Multidetector CT imaging of the head and cervical spine was performed following the standard protocol without intravenous contrast. Multiplanar CT image reconstructions of the cervical spine were also generated. COMPARISON:  None. FINDINGS: CT HEAD FINDINGS Initial images were degraded by patient motion. Exam repeated with improvement. Brain: No acute intracranial hemorrhage. No focal mass lesion. No CT evidence of acute infarction. No midline shift or mass effect. No hydrocephalus. Basilar cisterns are patent. There are periventricular and subcortical white matter hypodensities. Generalized cortical atrophy. Vascular: No hyperdense vessel or unexpected calcification. Skull: Normal. Negative for fracture or focal lesion. Sinuses/Orbits: Paranasal sinuses and mastoid air cells are clear. Orbits are clear. Other: None. CT CERVICAL SPINE FINDINGS Alignment: Normal alignment of the cervical vertebral bodies. Skull base and vertebrae: Normal craniocervical junction. No loss of vertebral body height or disc height. Normal facet articulation. No evidence of fracture. Soft tissues and spinal canal: No prevertebral soft tissue swelling. No perispinal or epidural hematoma. Disc levels: Multiple levels of endplate spurring no subluxation. Mild disc space narrowing in the lower cervical spine. Upper chest: Clear Other: None IMPRESSION: 1. No intracranial trauma. 2. Chronic atrophy white matter microvascular disease. 3. No cervical spine fracture. 4. Multilevel disc osteophytic disease. Electronically Signed   By: Suzy Bouchard M.D.   On: 07/23/2019 17:05    Procedures Procedures (including critical care time)  Medications Ordered in ED Medications - No data to display   Initial Impression / Assessment and Plan / ED Course  I have reviewed the triage vital signs and the nursing notes.  Pertinent labs & imaging results that were  available during my care of the patient were reviewed by me and considered in my medical decision making (see chart for details).        CT head and cervical spine without acute findings.  Patient patient at her baseline mental status.  Discharge back to nursing facility.  Final Clinical Impressions(s) / ED Diagnoses   Final diagnoses:  Fall, initial encounter    ED Discharge Orders    None       Julianne Rice, MD 07/23/19 2233

## 2019-07-23 NOTE — ED Notes (Signed)
Patient's daughter requesting Nicole Barker, [id number]

## 2019-07-26 ENCOUNTER — Other Ambulatory Visit: Payer: Self-pay

## 2019-07-26 ENCOUNTER — Emergency Department (HOSPITAL_COMMUNITY)
Admission: EM | Admit: 2019-07-26 | Discharge: 2019-07-26 | Disposition: A | Discharge status: Nursing Home (Includes ICF) | Payer: Medicare Other | Source: Home / Self Care | Attending: Emergency Medicine | Admitting: Emergency Medicine

## 2019-07-26 ENCOUNTER — Encounter (HOSPITAL_COMMUNITY): Payer: Self-pay

## 2019-07-26 ENCOUNTER — Emergency Department (HOSPITAL_COMMUNITY): Payer: Medicare Other

## 2019-07-26 DIAGNOSIS — R600 Localized edema: Secondary | ICD-10-CM

## 2019-07-26 DIAGNOSIS — Y999 Unspecified external cause status: Secondary | ICD-10-CM | POA: Insufficient documentation

## 2019-07-26 DIAGNOSIS — W19XXXA Unspecified fall, initial encounter: Secondary | ICD-10-CM | POA: Insufficient documentation

## 2019-07-26 DIAGNOSIS — I130 Hypertensive heart and chronic kidney disease with heart failure and stage 1 through stage 4 chronic kidney disease, or unspecified chronic kidney disease: Secondary | ICD-10-CM | POA: Diagnosis not present

## 2019-07-26 DIAGNOSIS — F0390 Unspecified dementia without behavioral disturbance: Secondary | ICD-10-CM | POA: Insufficient documentation

## 2019-07-26 DIAGNOSIS — I10 Essential (primary) hypertension: Secondary | ICD-10-CM | POA: Insufficient documentation

## 2019-07-26 DIAGNOSIS — M4810 Ankylosing hyperostosis [Forestier], site unspecified: Secondary | ICD-10-CM | POA: Insufficient documentation

## 2019-07-26 DIAGNOSIS — Z7901 Long term (current) use of anticoagulants: Secondary | ICD-10-CM | POA: Insufficient documentation

## 2019-07-26 DIAGNOSIS — S32048A Other fracture of fourth lumbar vertebra, initial encounter for closed fracture: Secondary | ICD-10-CM | POA: Insufficient documentation

## 2019-07-26 DIAGNOSIS — Y92019 Unspecified place in single-family (private) house as the place of occurrence of the external cause: Secondary | ICD-10-CM | POA: Insufficient documentation

## 2019-07-26 DIAGNOSIS — I4891 Unspecified atrial fibrillation: Secondary | ICD-10-CM | POA: Insufficient documentation

## 2019-07-26 DIAGNOSIS — Y939 Activity, unspecified: Secondary | ICD-10-CM | POA: Insufficient documentation

## 2019-07-26 DIAGNOSIS — J45909 Unspecified asthma, uncomplicated: Secondary | ICD-10-CM | POA: Insufficient documentation

## 2019-07-26 DIAGNOSIS — R17 Unspecified jaundice: Secondary | ICD-10-CM | POA: Diagnosis not present

## 2019-07-26 DIAGNOSIS — S32009A Unspecified fracture of unspecified lumbar vertebra, initial encounter for closed fracture: Secondary | ICD-10-CM

## 2019-07-26 DIAGNOSIS — Z96659 Presence of unspecified artificial knee joint: Secondary | ICD-10-CM | POA: Insufficient documentation

## 2019-07-26 LAB — URINALYSIS, ROUTINE W REFLEX MICROSCOPIC
Bilirubin Urine: NEGATIVE
Glucose, UA: NEGATIVE mg/dL
Hgb urine dipstick: NEGATIVE
Ketones, ur: 5 mg/dL — AB
Nitrite: NEGATIVE
Protein, ur: NEGATIVE mg/dL
Specific Gravity, Urine: 1.018 (ref 1.005–1.030)
pH: 5 (ref 5.0–8.0)

## 2019-07-26 LAB — COMPREHENSIVE METABOLIC PANEL
ALT: 37 U/L (ref 0–44)
AST: 50 U/L — ABNORMAL HIGH (ref 15–41)
Albumin: 3.5 g/dL (ref 3.5–5.0)
Alkaline Phosphatase: 116 U/L (ref 38–126)
Anion gap: 13 (ref 5–15)
BUN: 29 mg/dL — ABNORMAL HIGH (ref 8–23)
CO2: 27 mmol/L (ref 22–32)
Calcium: 9.2 mg/dL (ref 8.9–10.3)
Chloride: 95 mmol/L — ABNORMAL LOW (ref 98–111)
Creatinine, Ser: 1.6 mg/dL — ABNORMAL HIGH (ref 0.44–1.00)
GFR calc Af Amer: 33 mL/min — ABNORMAL LOW (ref >60)
GFR calc non Af Amer: 29 mL/min — ABNORMAL LOW (ref >60)
Glucose, Bld: 97 mg/dL (ref 70–99)
Potassium: 4 mmol/L (ref 3.5–5.1)
Sodium: 135 mmol/L (ref 135–145)
Total Bilirubin: 1.8 mg/dL — ABNORMAL HIGH (ref 0.3–1.2)
Total Protein: 6.4 g/dL — ABNORMAL LOW (ref 6.5–8.1)

## 2019-07-26 LAB — CBC WITH DIFFERENTIAL/PLATELET
Abs Immature Granulocytes: 0.08 10*3/uL — ABNORMAL HIGH (ref 0.00–0.07)
Basophils Absolute: 0 10*3/uL (ref 0.0–0.1)
Basophils Relative: 0 %
Eosinophils Absolute: 0 10*3/uL (ref 0.0–0.5)
Eosinophils Relative: 0 %
HCT: 47.3 % — ABNORMAL HIGH (ref 36.0–46.0)
Hemoglobin: 14.4 g/dL (ref 12.0–15.0)
Immature Granulocytes: 1 %
Lymphocytes Relative: 7 %
Lymphs Abs: 1.1 10*3/uL (ref 0.7–4.0)
MCH: 27.6 pg (ref 26.0–34.0)
MCHC: 30.4 g/dL (ref 30.0–36.0)
MCV: 90.8 fL (ref 80.0–100.0)
Monocytes Absolute: 1.6 10*3/uL — ABNORMAL HIGH (ref 0.1–1.0)
Monocytes Relative: 10 %
Neutro Abs: 13.6 10*3/uL — ABNORMAL HIGH (ref 1.7–7.7)
Neutrophils Relative %: 82 %
Platelets: 167 10*3/uL (ref 150–400)
RBC: 5.21 MIL/uL — ABNORMAL HIGH (ref 3.87–5.11)
RDW: 18.1 % — ABNORMAL HIGH (ref 11.5–15.5)
WBC: 16.5 10*3/uL — ABNORMAL HIGH (ref 4.0–10.5)
nRBC: 0 % (ref 0.0–0.2)

## 2019-07-26 LAB — VALPROIC ACID LEVEL: Valproic Acid Lvl: 50 ug/mL (ref 50.0–100.0)

## 2019-07-26 LAB — PROTIME-INR
INR: 1.6 — ABNORMAL HIGH (ref 0.8–1.2)
Prothrombin Time: 18.9 seconds — ABNORMAL HIGH (ref 11.4–15.2)

## 2019-07-26 NOTE — ED Provider Notes (Signed)
Medical screening examination/treatment/procedure(s) were conducted as a shared visit with non-physician practitioner(s) and myself.  I personally evaluated the patient during the encounter.  EKG Interpretation  Date/Time:  Wednesday July 26 2019 09:55:37 EDT Ventricular Rate:  128 PR Interval:    QRS Duration: 76 QT Interval:  347 QTC Calculation: 507 R Axis:   78 Text Interpretation:  Atrial fibrillation Probable anterior infarct, age indeterminate Prolonged QT interval with rapid ventricular response Confirmed by Fredia Sorrow [phone]) on 07/26/2019 10:12:09 AM   Patient seen by me along with the physician assistant.  Patient has dementia so historical information and review of systems not really obtainable.  But she is from [place] history of multiple falls.  Patient is on a blood thinner which may not be wise even though it is on because she has atrial fib.  Patient with complaint of back pain also had some flank bruising.  Work-up showed evidence of kind of a fused lumbar area and there was a fracture.  Discussed with neurosurgery who is recommending a lumbar type brace and they will follow her up in the office and she was okay to go back to the nursing facility.   Fredia Sorrow, MD 07/26/19 1435

## 2019-07-26 NOTE — TOC Initial Note (Signed)
Transition of Care [facility]) - Initial/Assessment Note    Patient Details  Name: Nicole Barker MRN: [id number] Date of Birth: 1930-09-26  [facility] CM/SW Contact:    Erenest Rasher, RN Phone Number: [phone] 07/26/2019, 6:38 PM  Clinical Narrative:                 Spoke to pt's dtr, [name]. States pt is at [facility] in [facility]. She has been getting out of bed and falling. She is active with HHPT with [facility]. She has RW at ALF. Spoke to dtr about a hospital bed and she is agreeable to hospital bed. Will follow up with [facility] and Wellness Director to coordinate delivery of hospital. Pt may not benefit from hospital bed due to dementia.   Expected Discharge Plan: Assisted Living Barriers to Discharge: No Barriers Identified   Patient Goals and CMS Choice Patient states their goals for this hospitalization and ongoing recovery are:: wants her to stop falling CMS Medicare.gov Compare Post Acute Care list provided to:: Patient Represenative (must comment) Choice offered to / list presented to : Adult Children  Expected Discharge Plan and Services Expected Discharge Plan: Assisted Living   Discharge Planning Services: CM Consult Post Acute Care Choice: Home Health Living arrangements for the past 2 months: [place]                 DME Arranged: Hospital bed DME Agency: [place] Date DME Agency Contacted: 07/26/19     HH Arranged: PT, OT, Social Work   Date [place]: 07/26/19 Time [place]: Psychologist, educational spoke with at [place]: Levonne Spiller  Prior Living Arrangements/Services Living arrangements for the past 2 months: [place] Lives with:: Facility Resident Patient language and need for interpreter reviewed:: Yes Do you feel safe going back to the place where you live?: Yes      Need for Family Participation in Patient Care: Yes (Comment) Care  giver support system in place?: Yes (comment) Current home services: DME(rolling walker) Criminal Activity/Legal Involvement Pertinent to Current Situation/Hospitalization: No - Comment as needed  Activities of Daily Living      Permission Sought/Granted Permission sought to share information with : Case Manager, Family Supports, PCP, Other (comment) Permission granted to share information with : Yes, Verbal Permission Granted  Share Information with NAME: Vickey Sages  Permission granted to share info w AGENCY: [place] granted to share info w Relationship: daughter  Permission granted to share info w Contact Information: [phone]  Emotional Assessment       Orientation: : Oriented to Self   Psych Involvement: No (comment)  Admission diagnosis:  failure to thrive edema Patient Active Problem List   Diagnosis Date Noted  . Pneumonia 07/07/2019  . Acute on chronic congestive heart failure ([place])   . SOB (shortness of breath) 12/15/2018  . Bradycardia 12/15/2018  . Cardiomyopathy ([place]) 11/22/2018  . Chronic anticoagulation 11/22/2018  . Acute heart failure ([place]) 10/25/2018  . PAF (paroxysmal atrial fibrillation) ([place]) 10/25/2018  . Dementia without behavioral disturbance ([place]) 10/25/2018  . Essential hypertension 10/25/2018  . Hyperlipidemia 10/25/2018  . CRI (chronic renal insufficiency), stage 3 (moderate) ([place]) 10/25/2018   PCP:  Merlene Laughter, MD Pharmacy:  No Pharmacies Listed    Social Determinants of Health (SDOH) Interventions    Readmission Risk Interventions No flowsheet data found.

## 2019-07-26 NOTE — Discharge Instructions (Signed)
Follow up with neurosurgery, call tomorrow to schedule an appointment. Recheck with your doctor tomorrow as previously planned. Wear LSO brace. Tylenol as needed as directed for pain. Pain is controlled in the ER without any additional medication. If pain is not controlled after leaving the ER, discuss with your doctor tomorrow.

## 2019-07-26 NOTE — ED Triage Notes (Signed)
Pt arrives [facility] from [facility] for multiple falls and bilateral leg edema. Pt has no obvious injuries.

## 2019-07-26 NOTE — ED Provider Notes (Signed)
[facility] DEPT Provider Note   CSN: [id number] Arrival date & time: 07/26/19  0913    History   Chief Complaint Chief Complaint  Patient presents with   Leg Swelling    multiple falls    HPI Nicole Barker is a 85 y.o. female.     85 year old female brought in by EMS from nursing facility for fall today.  Patient has dementia, states she may have felt dizzy, she is unsure who states that she fell landing on the ground and now has pain in her low back.  She denies hitting her head or loss of consciousness.  Per EMS patient has multiple falls, is on Eliquis, nursing home also noted swelling in her legs.     Past Medical History:  Diagnosis Date   Anxiety    Asthma    Atrial fibrillation ([place])    Dementia ([place])    Hyperlipidemia    Hypertension     Patient Active Problem List   Diagnosis Date Noted   Pneumonia 07/07/2019   Acute on chronic congestive heart failure ([place])    SOB (shortness of breath) 12/15/2018   Bradycardia 12/15/2018   Cardiomyopathy ([place]) 11/22/2018   Chronic anticoagulation 11/22/2018   Acute heart failure ([place]) 10/25/2018   PAF (paroxysmal atrial fibrillation) ([place]) 10/25/2018   Dementia without behavioral disturbance ([place]) 10/25/2018   Essential hypertension 10/25/2018   Hyperlipidemia 10/25/2018   CRI (chronic renal insufficiency), stage 3 (moderate) ([place]) 10/25/2018    Past Surgical History:  Procedure Laterality Date   ABDOMINAL HYSTERECTOMY     BACK SURGERY     JOINT REPLACEMENT     knee     OB History   No obstetric history on file.      Home Medications    Prior to Admission medications   Medication Sig Start Date End Date Taking? Authorizing Provider  acetaminophen (TYLENOL) 325 MG tablet Take 2 tablets (650 mg total) by mouth every 6 (six) hours as needed for mild pain or headache. 11/01/18  Yes Sheikh, Omair Latif, DO  albuterol (ACCUNEB) 0.63 MG/3ML nebulizer  solution Take 1 ampule by nebulization every 8 (eight) hours as needed for shortness of breath.   Yes [provider]  apixaban (ELIQUIS) 2.5 MG TABS tablet Take 1 tablet (2.5 mg total) by mouth 2 (two) times daily. 07/09/19  Yes Bland, Scott, DO  atorvastatin (LIPITOR) 10 MG tablet Take 10 mg by mouth at bedtime.   Yes [provider]  BREO ELLIPTA 100-25 MCG/INH AEPB Inhale 2 puffs into the lungs every morning. 06/09/19  Yes [provider]  busPIRone (BUSPAR) 5 MG tablet Take 1 tablet (5 mg total) by mouth 3 (three) times daily as needed (for anxiety (frequent subjective SOB)). 07/09/19  Yes Bland, Scott, DO  divalproex (DEPAKOTE) 250 MG DR tablet Take 250 mg by mouth 2 (two) times daily.   Yes [provider]  doxycycline (VIBRAMYCIN) 100 MG capsule Take 100 mg by mouth 2 (two) times daily. 07/17/19  Yes [provider]  escitalopram (LEXAPRO) 10 MG tablet Take 10 mg by mouth daily. 06/22/19  Yes [provider]  furosemide (LASIX) 40 MG tablet Take 1 tablet (40 mg total) by mouth daily. 01/03/19 07/26/19 Yes Kilroy, Luke K, PA-C  gabapentin (NEURONTIN) 100 MG capsule Take 100 mg by mouth 2 (two) times daily.   Yes [provider]  Melatonin 5 MG TABS Take 5 mg by mouth at bedtime.   Yes [provider]  Multiple Vitamins-Minerals (CENTRUM SILVER 50+WOMEN) TABS Take 1 tablet by mouth daily.   Yes [provider]  polyethylene glycol (MIRALAX / GLYCOLAX) packet Take 17 g by mouth daily. 11/01/18  Yes Sheikh, [place], DO  senna-docusate (SENOKOT-S) 8.6-50 MG tablet Take 1 tablet by mouth at bedtime as needed for mild constipation. 11/01/18  Yes Sheikh, Omair Latif, DO  fluticasone (FLONASE) 50 MCG/ACT nasal spray Place 1 spray into both nostrils daily. Patient not taking: Reported on 07/26/2019 11/02/18   Kerney Elbe, DO    Family History Family History  Problem Relation Age of Onset   Hypertension Mother      Social History Social History   Tobacco Use   Smoking status: Never Smoker   Smokeless tobacco: Never Used  Substance Use Topics   Alcohol use: Never    Frequency: Never   Drug use: Never     Allergies   Iodine   Review of Systems Review of Systems  Unable to perform ROS: Dementia     Physical Exam Updated Vital Signs BP (!) 132/99    Pulse (!) 125    Temp 97.9 F (36.6 C) (Oral)    Resp 20    Ht 4\' 11"  (1.499 m)    Wt 71.9 kg    SpO2 92%    BMI 32.02 kg/m   Physical Exam   ED Treatments / Results  Labs (all labs ordered are listed, but only abnormal results are displayed) Labs Reviewed  COMPREHENSIVE METABOLIC PANEL - Abnormal; Notable for the following components:      Result Value   Chloride 95 (*)    BUN 29 (*)    Creatinine, Ser 1.60 (*)    Total Protein 6.4 (*)    AST 50 (*)    Total Bilirubin 1.8 (*)    GFR calc non Af Amer 29 (*)    GFR calc Af Amer 33 (*)    All other components within normal limits  CBC WITH DIFFERENTIAL/PLATELET - Abnormal; Notable for the following components:   WBC 16.5 (*)    RBC 5.21 (*)    HCT 47.3 (*)    RDW 18.1 (*)    Neutro Abs 13.6 (*)    Monocytes Absolute 1.6 (*)    Abs Immature Granulocytes 0.08 (*)    All other components within normal limits  PROTIME-INR - Abnormal; Notable for the following components:   Prothrombin Time 18.9 (*)    INR 1.6 (*)    All other components within normal limits  URINALYSIS, ROUTINE W REFLEX MICROSCOPIC - Abnormal; Notable for the following components:   Ketones, ur 5 (*)    Leukocytes,Ua TRACE (*)    Bacteria, UA RARE (*)    All other components within normal limits  VALPROIC ACID LEVEL    EKG EKG Interpretation  Date/Time:  Wednesday July 26 2019 09:55:37 EDT Ventricular Rate:  128 PR Interval:    QRS Duration: 76 QT Interval:  347 QTC Calculation: 507 R Axis:   78 Text Interpretation:  Atrial fibrillation Probable anterior infarct, age indeterminate  Prolonged QT interval with rapid ventricular response Confirmed by Fredia Sorrow [id number]) on 07/26/2019 10:12:09 AM   Radiology Ct Abdomen Pelvis Wo Contrast  Addendum Date: 07/26/2019   ADDENDUM REPORT: 07/26/2019 12:30 ADDENDUM: Findings were discussed with provider Suella Broad via telephone at 12:28 p.m. on 07/26/2019 by Dr. Davina Poke. Electronically Signed   By: Davina Poke M.D.   On: 07/26/2019 12:30   Result Date:  07/26/2019 CLINICAL DATA:  Abdominal pain, multiple falls EXAM: CT ABDOMEN AND PELVIS WITHOUT CONTRAST TECHNIQUE: Multidetector CT imaging of the abdomen and pelvis was performed following the standard protocol without IV contrast. COMPARISON:  None. FINDINGS: Lower chest: Moderate right and small left pleural effusions with associated atelectasis. Hepatobiliary: Liver has an unremarkable noncontrast appearance. No discrete liver lesion. Gallbladder surgically absent. No intrahepatic biliary ductal dilatation. Pancreas: Unremarkable. No pancreatic ductal dilatation or surrounding inflammatory changes. Spleen: Normal in size without focal abnormality. Adrenals/Urinary Tract: Adrenal glands unremarkable. Subcentimeter partially exophytic lesions are identified involving both kidneys, too small to definitively characterize. No renal calculi. No hydronephrosis. Urinary bladder unremarkable. Stomach/Bowel: Stomach is within normal limits. Appendix appears normal. No evidence of bowel wall thickening, distention, or inflammatory changes. Vascular/Lymphatic: Aortoiliac vascular calcifications. No abdominal or pelvic lymphadenopathy. Reproductive: Status post hysterectomy. No adnexal masses. Other: No abdominal wall hernia or abnormality. No abdominopelvic ascites. Musculoskeletal: Extensive flowing syndesmophytes throughout the visualized thoracolumbar spine suggesting DISH. There is a fracture through the L4 vertebral body extending into the L4-5 disc space (series 6, images 94-99). No  vertebral body height loss. No bony retropulsion. Advanced multilevel degenerative changes of the thoracolumbar spine with complete disc height loss of L3-4. Grade 1-2 anterolisthesis of L3-4. Degenerative changes of the bilateral hips without fracture or dislocation. Advanced OA at the pubic symphysis. IMPRESSION: 1. Findings of DISH with an acute appearing fracture through the L4 vertebral body extending to the L4-5 disc space. Correlation with point tenderness at this level is recommended. 2. Moderate right and small left pleural effusions. 3. Bilateral subcentimeter indeterminate renal lesions, some of which may represent cysts. Consider nonemergent renal ultrasound as clinically warranted. These results will be called to the ordering clinician or representative by the Radiologist Assistant, and communication documented in the PACS or zVision Dashboard. Electronically Signed: By: Davina Poke M.D. On: 07/26/2019 12:21   Ct Head Wo Contrast  Result Date: 07/26/2019 CLINICAL DATA:  Multiple falls. Bilateral leg edema and ataxia. History of dementia. EXAM: CT HEAD WITHOUT CONTRAST CT CERVICAL SPINE WITHOUT CONTRAST TECHNIQUE: Multidetector CT imaging of the head and cervical spine was performed following the standard protocol without intravenous contrast. Multiplanar CT image reconstructions of the cervical spine were also generated. COMPARISON:  CT head and cervical spine 07/23/2019. FINDINGS: CT HEAD FINDINGS Brain: There is no evidence of acute intracranial hemorrhage, mass lesion, brain edema or extra-axial fluid collection. There is moderate generalized atrophy with prominence of the ventricles and subarachnoid spaces. Confluent low-density is again noted within the periventricular white matter consistent with chronic small vessel ischemic changes. There is no CT evidence of acute cortical infarction. Vascular: Prominent intracranial vascular calcifications are again noted. No hyperdense vessel  identified. Skull: Negative for fracture or focal lesion. Sinuses/Orbits: The visualized paranasal sinuses and mastoid air cells are clear. No orbital abnormalities are seen. Other: None. CT CERVICAL SPINE FINDINGS Alignment: Normal. Skull base and vertebrae: There is no evidence of acute cervical spine fracture or traumatic subluxation. Multilevel spondylosis is again noted. Soft tissues and spinal canal: No prevertebral fluid or swelling. No visible canal hematoma. Disc levels: No large disc herniation is identified. Multilevel spondylosis with disc space narrowing, uncinate spurring and facet hypertrophy again noted, greatest from C4-5 through C6-7. Upper chest: Small right pleural effusion. Other: There is a stable low-density lesion in the right thyroid lobe, measuring 17 mm. This appears similar to chest CTA 11/02/2018. Bilateral carotid atherosclerosis. IMPRESSION: 1. Stable head CT without acute intracranial findings. Stable  atrophy and chronic small vessel ischemic changes. 2. No evidence of acute cervical spine fracture, traumatic subluxation or static signs of instability. 3. Multilevel cervical spondylosis. Electronically Signed   By: Richardean Sale M.D.   On: 07/26/2019 12:04   Ct Cervical Spine Wo Contrast  Result Date: 07/26/2019 CLINICAL DATA:  Multiple falls. Bilateral leg edema and ataxia. History of dementia. EXAM: CT HEAD WITHOUT CONTRAST CT CERVICAL SPINE WITHOUT CONTRAST TECHNIQUE: Multidetector CT imaging of the head and cervical spine was performed following the standard protocol without intravenous contrast. Multiplanar CT image reconstructions of the cervical spine were also generated. COMPARISON:  CT head and cervical spine 07/23/2019. FINDINGS: CT HEAD FINDINGS Brain: There is no evidence of acute intracranial hemorrhage, mass lesion, brain edema or extra-axial fluid collection. There is moderate generalized atrophy with prominence of the ventricles and subarachnoid spaces. Confluent  low-density is again noted within the periventricular white matter consistent with chronic small vessel ischemic changes. There is no CT evidence of acute cortical infarction. Vascular: Prominent intracranial vascular calcifications are again noted. No hyperdense vessel identified. Skull: Negative for fracture or focal lesion. Sinuses/Orbits: The visualized paranasal sinuses and mastoid air cells are clear. No orbital abnormalities are seen. Other: None. CT CERVICAL SPINE FINDINGS Alignment: Normal. Skull base and vertebrae: There is no evidence of acute cervical spine fracture or traumatic subluxation. Multilevel spondylosis is again noted. Soft tissues and spinal canal: No prevertebral fluid or swelling. No visible canal hematoma. Disc levels: No large disc herniation is identified. Multilevel spondylosis with disc space narrowing, uncinate spurring and facet hypertrophy again noted, greatest from C4-5 through C6-7. Upper chest: Small right pleural effusion. Other: There is a stable low-density lesion in the right thyroid lobe, measuring 17 mm. This appears similar to chest CTA 11/02/2018. Bilateral carotid atherosclerosis. IMPRESSION: 1. Stable head CT without acute intracranial findings. Stable atrophy and chronic small vessel ischemic changes. 2. No evidence of acute cervical spine fracture, traumatic subluxation or static signs of instability. 3. Multilevel cervical spondylosis. Electronically Signed   By: Richardean Sale M.D.   On: 07/26/2019 12:04    Procedures Procedures (including critical care time)  Medications Ordered in ED Medications - No data to display   Initial Impression / Assessment and Plan / ED Course  I have reviewed the triage vital signs and the nursing notes.  Pertinent labs & imaging results that were available during my care of the patient were reviewed by me and considered in my medical decision making (see chart for details).  Clinical Course as of Jul 26 1643  Wed Jul 25, 7153  2555 85 year old female with history of dementia, on Eliquis for A. fib presents for evaluation after a fall today.  Patient states that she felt dizzy and fell landing on her back.  Patient denies hitting her head or loss of consciousness.  Patient reports pain in her back.  On exam patient has ecchymosis to her right flank area, no bony tenderness in her back and patient has mild edema to bilateral feet, sensation and strength symmetric in her lower extremities.   [LM]  1444 EKG shows A. fib, not in RVR, Depakote level therapeutic, CMP with mild increase in her creatinine 1.6 today.  INR 1.6, CBC with mild leukocytosis of 16.5.  Urinalysis is pending.  CT head and neck do not show acute injury as a result of her fall.  CT abdomen pelvis with DISH, L4 vertebral body fracture.  Case discussed with Dr. Ronnald Ramp with neurosurgery who  has reviewed patient's films, recommends LSO brace, patient is not a surgical candidate at this time and may be discharged if her pain is controlled to follow-up in clinic.   [LM]  1456 Discussed results with patient, pain is controlled at this time, patient would like her results discussed with her daughter [name].    [LM]  [phone] Results reviewed with patient's daughter [name], plan is for PCP to recheck patient tomorrow at the facility.  Patient will need outpatient follow-up with neurosurgery.  She will be discharged in an LSO brace.  His daughter states she is on antibiotics for pneumonia currently.  This may explain her white count of 16.5.   [LM]    Clinical Course User Index [LM] Tacy Learn, PA-C      Final Clinical Impressions(s) / ED Diagnoses   Final diagnoses:  Closed fracture of lumbar vertebral body ([place])  DISH (diffuse idiopathic skeletal hyperostosis)  Leg edema    ED Discharge Orders    None       Tacy Learn, PA-C 07/26/19 1644    Fredia Sorrow, MD 07/28/19 [phone]

## 2019-07-29 ENCOUNTER — Other Ambulatory Visit: Payer: Self-pay

## 2019-07-29 ENCOUNTER — Emergency Department (HOSPITAL_COMMUNITY): Payer: Medicare Other

## 2019-07-29 ENCOUNTER — Inpatient Hospital Stay (HOSPITAL_COMMUNITY)
Admission: EM | Admit: 2019-07-29 | Discharge: 2019-08-03 | DRG: 291 | Disposition: A | Discharge status: Skilled Nursing Facility | Payer: Medicare Other | Attending: Family Medicine | Admitting: Family Medicine

## 2019-07-29 ENCOUNTER — Encounter (HOSPITAL_COMMUNITY): Payer: Self-pay | Admitting: Emergency Medicine

## 2019-07-29 ENCOUNTER — Inpatient Hospital Stay (HOSPITAL_COMMUNITY): Payer: Medicare Other

## 2019-07-29 ENCOUNTER — Emergency Department (HOSPITAL_COMMUNITY)
Admission: EM | Admit: 2019-07-29 | Discharge: 2019-07-29 | Disposition: A | Discharge status: Home / Self Care | Payer: Medicare Other | Source: Home / Self Care | Attending: Emergency Medicine | Admitting: Emergency Medicine

## 2019-07-29 DIAGNOSIS — E440 Moderate protein-calorie malnutrition: Secondary | ICD-10-CM | POA: Diagnosis present

## 2019-07-29 DIAGNOSIS — M4810 Ankylosing hyperostosis [Forestier], site unspecified: Secondary | ICD-10-CM | POA: Diagnosis present

## 2019-07-29 DIAGNOSIS — N184 Chronic kidney disease, stage 4 (severe): Secondary | ICD-10-CM | POA: Diagnosis present

## 2019-07-29 DIAGNOSIS — I480 Paroxysmal atrial fibrillation: Secondary | ICD-10-CM | POA: Insufficient documentation

## 2019-07-29 DIAGNOSIS — J449 Chronic obstructive pulmonary disease, unspecified: Secondary | ICD-10-CM | POA: Diagnosis present

## 2019-07-29 DIAGNOSIS — R4781 Slurred speech: Secondary | ICD-10-CM | POA: Diagnosis present

## 2019-07-29 DIAGNOSIS — F419 Anxiety disorder, unspecified: Secondary | ICD-10-CM | POA: Diagnosis present

## 2019-07-29 DIAGNOSIS — I428 Other cardiomyopathies: Secondary | ICD-10-CM | POA: Diagnosis present

## 2019-07-29 DIAGNOSIS — I272 Pulmonary hypertension, unspecified: Secondary | ICD-10-CM | POA: Diagnosis present

## 2019-07-29 DIAGNOSIS — Z7951 Long term (current) use of inhaled steroids: Secondary | ICD-10-CM

## 2019-07-29 DIAGNOSIS — Z9049 Acquired absence of other specified parts of digestive tract: Secondary | ICD-10-CM

## 2019-07-29 DIAGNOSIS — R17 Unspecified jaundice: Secondary | ICD-10-CM | POA: Diagnosis present

## 2019-07-29 DIAGNOSIS — W1830XA Fall on same level, unspecified, initial encounter: Secondary | ICD-10-CM | POA: Diagnosis present

## 2019-07-29 DIAGNOSIS — Z79899 Other long term (current) drug therapy: Secondary | ICD-10-CM

## 2019-07-29 DIAGNOSIS — I130 Hypertensive heart and chronic kidney disease with heart failure and stage 1 through stage 4 chronic kidney disease, or unspecified chronic kidney disease: Secondary | ICD-10-CM | POA: Diagnosis present

## 2019-07-29 DIAGNOSIS — Z66 Do not resuscitate: Secondary | ICD-10-CM | POA: Diagnosis present

## 2019-07-29 DIAGNOSIS — N390 Urinary tract infection, site not specified: Secondary | ICD-10-CM | POA: Diagnosis present

## 2019-07-29 DIAGNOSIS — I509 Heart failure, unspecified: Secondary | ICD-10-CM

## 2019-07-29 DIAGNOSIS — I5043 Acute on chronic combined systolic (congestive) and diastolic (congestive) heart failure: Secondary | ICD-10-CM | POA: Diagnosis present

## 2019-07-29 DIAGNOSIS — Z90710 Acquired absence of both cervix and uterus: Secondary | ICD-10-CM

## 2019-07-29 DIAGNOSIS — S32049A Unspecified fracture of fourth lumbar vertebra, initial encounter for closed fracture: Secondary | ICD-10-CM | POA: Diagnosis not present

## 2019-07-29 DIAGNOSIS — R296 Repeated falls: Secondary | ICD-10-CM

## 2019-07-29 DIAGNOSIS — F0390 Unspecified dementia without behavioral disturbance: Secondary | ICD-10-CM | POA: Diagnosis present

## 2019-07-29 DIAGNOSIS — Z8249 Family history of ischemic heart disease and other diseases of the circulatory system: Secondary | ICD-10-CM

## 2019-07-29 DIAGNOSIS — I4891 Unspecified atrial fibrillation: Secondary | ICD-10-CM

## 2019-07-29 DIAGNOSIS — E785 Hyperlipidemia, unspecified: Secondary | ICD-10-CM | POA: Diagnosis present

## 2019-07-29 DIAGNOSIS — J9621 Acute and chronic respiratory failure with hypoxia: Secondary | ICD-10-CM | POA: Diagnosis present

## 2019-07-29 DIAGNOSIS — B9620 Unspecified Escherichia coli [E. coli] as the cause of diseases classified elsewhere: Secondary | ICD-10-CM | POA: Diagnosis present

## 2019-07-29 DIAGNOSIS — Z7901 Long term (current) use of anticoagulants: Secondary | ICD-10-CM

## 2019-07-29 DIAGNOSIS — Y939 Activity, unspecified: Secondary | ICD-10-CM | POA: Insufficient documentation

## 2019-07-29 DIAGNOSIS — W19XXXA Unspecified fall, initial encounter: Secondary | ICD-10-CM

## 2019-07-29 DIAGNOSIS — Z20828 Contact with and (suspected) exposure to other viral communicable diseases: Secondary | ICD-10-CM | POA: Diagnosis present

## 2019-07-29 DIAGNOSIS — G8929 Other chronic pain: Secondary | ICD-10-CM | POA: Diagnosis present

## 2019-07-29 DIAGNOSIS — Z515 Encounter for palliative care: Secondary | ICD-10-CM

## 2019-07-29 DIAGNOSIS — Y92129 Unspecified place in nursing home as the place of occurrence of the external cause: Secondary | ICD-10-CM | POA: Insufficient documentation

## 2019-07-29 DIAGNOSIS — L8990 Pressure ulcer of unspecified site, unspecified stage: Secondary | ICD-10-CM | POA: Insufficient documentation

## 2019-07-29 DIAGNOSIS — D696 Thrombocytopenia, unspecified: Secondary | ICD-10-CM | POA: Diagnosis present

## 2019-07-29 DIAGNOSIS — I10 Essential (primary) hypertension: Secondary | ICD-10-CM | POA: Insufficient documentation

## 2019-07-29 DIAGNOSIS — Y999 Unspecified external cause status: Secondary | ICD-10-CM | POA: Insufficient documentation

## 2019-07-29 DIAGNOSIS — S0003XA Contusion of scalp, initial encounter: Secondary | ICD-10-CM

## 2019-07-29 DIAGNOSIS — J45909 Unspecified asthma, uncomplicated: Secondary | ICD-10-CM | POA: Insufficient documentation

## 2019-07-29 DIAGNOSIS — L89892 Pressure ulcer of other site, stage 2: Secondary | ICD-10-CM | POA: Diagnosis present

## 2019-07-29 DIAGNOSIS — I5023 Acute on chronic systolic (congestive) heart failure: Secondary | ICD-10-CM | POA: Diagnosis not present

## 2019-07-29 DIAGNOSIS — I083 Combined rheumatic disorders of mitral, aortic and tricuspid valves: Secondary | ICD-10-CM | POA: Diagnosis present

## 2019-07-29 DIAGNOSIS — Z888 Allergy status to other drugs, medicaments and biological substances status: Secondary | ICD-10-CM

## 2019-07-29 DIAGNOSIS — Z6832 Body mass index (BMI) 32.0-32.9, adult: Secondary | ICD-10-CM

## 2019-07-29 DIAGNOSIS — S32049D Unspecified fracture of fourth lumbar vertebra, subsequent encounter for fracture with routine healing: Secondary | ICD-10-CM

## 2019-07-29 LAB — COMPREHENSIVE METABOLIC PANEL
ALT: 39 U/L (ref 0–44)
AST: 62 U/L — ABNORMAL HIGH (ref 15–41)
Albumin: 2.9 g/dL — ABNORMAL LOW (ref 3.5–5.0)
Alkaline Phosphatase: 106 U/L (ref 38–126)
Anion gap: 17 — ABNORMAL HIGH (ref 5–15)
BUN: 38 mg/dL — ABNORMAL HIGH (ref 8–23)
CO2: 24 mmol/L (ref 22–32)
Calcium: 9.2 mg/dL (ref 8.9–10.3)
Chloride: 97 mmol/L — ABNORMAL LOW (ref 98–111)
Creatinine, Ser: 1.62 mg/dL — ABNORMAL HIGH (ref 0.44–1.00)
GFR calc Af Amer: 33 mL/min — ABNORMAL LOW (ref >60)
GFR calc non Af Amer: 28 mL/min — ABNORMAL LOW (ref >60)
Glucose, Bld: 104 mg/dL — ABNORMAL HIGH (ref 70–99)
Potassium: 4.1 mmol/L (ref 3.5–5.1)
Sodium: 138 mmol/L (ref 135–145)
Total Bilirubin: 3 mg/dL — ABNORMAL HIGH (ref 0.3–1.2)
Total Protein: 5.4 g/dL — ABNORMAL LOW (ref 6.5–8.1)

## 2019-07-29 LAB — CBC WITH DIFFERENTIAL/PLATELET
Abs Immature Granulocytes: 0.13 10*3/uL — ABNORMAL HIGH (ref 0.00–0.07)
Basophils Absolute: 0 10*3/uL (ref 0.0–0.1)
Basophils Relative: 0 %
Eosinophils Absolute: 0 10*3/uL (ref 0.0–0.5)
Eosinophils Relative: 0 %
HCT: 43.9 % (ref 36.0–46.0)
Hemoglobin: 13.8 g/dL (ref 12.0–15.0)
Immature Granulocytes: 1 %
Lymphocytes Relative: 7 %
Lymphs Abs: 1.2 10*3/uL (ref 0.7–4.0)
MCH: 28.8 pg (ref 26.0–34.0)
MCHC: 31.4 g/dL (ref 30.0–36.0)
MCV: 91.5 fL (ref 80.0–100.0)
Monocytes Absolute: 1.5 10*3/uL — ABNORMAL HIGH (ref 0.1–1.0)
Monocytes Relative: 9 %
Neutro Abs: 13.1 10*3/uL — ABNORMAL HIGH (ref 1.7–7.7)
Neutrophils Relative %: 83 %
Platelets: DECREASED 10*3/uL (ref 150–400)
RBC: 4.8 MIL/uL (ref 3.87–5.11)
RDW: 18.7 % — ABNORMAL HIGH (ref 11.5–15.5)
WBC: 15.9 10*3/uL — ABNORMAL HIGH (ref 4.0–10.5)
nRBC: 1.1 % — ABNORMAL HIGH (ref 0.0–0.2)

## 2019-07-29 LAB — URINALYSIS, ROUTINE W REFLEX MICROSCOPIC
Bacteria, UA: NONE SEEN
Bilirubin Urine: NEGATIVE
Glucose, UA: NEGATIVE mg/dL
Hgb urine dipstick: NEGATIVE
Ketones, ur: NEGATIVE mg/dL
Nitrite: NEGATIVE
Protein, ur: 30 mg/dL — AB
Specific Gravity, Urine: 1.018 (ref 1.005–1.030)
pH: 5 (ref 5.0–8.0)

## 2019-07-29 LAB — LIPASE, BLOOD: Lipase: 38 U/L (ref 11–51)

## 2019-07-29 LAB — SARS CORONAVIRUS 2 BY RT PCR (HOSPITAL ORDER, PERFORMED IN ~~LOC~~ HOSPITAL LAB): SARS Coronavirus 2: NEGATIVE

## 2019-07-29 LAB — BRAIN NATRIURETIC PEPTIDE: B Natriuretic Peptide: 1882.7 pg/mL — ABNORMAL HIGH (ref 0.0–100.0)

## 2019-07-29 MED ORDER — SODIUM CHLORIDE 0.9 % IV SOLN: 250.0000 mL | INTRAVENOUS | Status: DC | PRN

## 2019-07-29 MED ORDER — MELATONIN 3 MG PO TABS
5.0000 mg | ORAL_TABLET | Freq: Every day | ORAL | Status: DC
  Administered 2019-07-29 – 2019-07-31 (×3): 4.5 mg via ORAL
  Filled 2019-07-29 (×4): qty 1.5

## 2019-07-29 MED ORDER — METOPROLOL TARTRATE 5 MG/5ML IV SOLN
2.5000 mg | Freq: Once | INTRAVENOUS | Status: AC
  Administered 2019-07-29: 2.5 mg via INTRAVENOUS
  Filled 2019-07-29: qty 5

## 2019-07-29 MED ORDER — GABAPENTIN 100 MG PO CAPS
100.0000 mg | ORAL_CAPSULE | Freq: Two times a day (BID) | ORAL | Status: DC
  Administered 2019-07-29 – 2019-08-03 (×10): 100 mg via ORAL
  Filled 2019-07-29 (×10): qty 1

## 2019-07-29 MED ORDER — ACETAMINOPHEN 325 MG PO TABS: 650.0000 mg | ORAL_TABLET | ORAL | Status: DC | PRN

## 2019-07-29 MED ORDER — METOPROLOL TARTRATE 5 MG/5ML IV SOLN: 5.0000 mg | Freq: Once | INTRAVENOUS | Status: DC

## 2019-07-29 MED ORDER — ESCITALOPRAM OXALATE 10 MG PO TABS
10.0000 mg | ORAL_TABLET | Freq: Every day | ORAL | Status: DC
  Administered 2019-07-30 – 2019-08-03 (×5): 10 mg via ORAL
  Filled 2019-07-29 (×5): qty 1

## 2019-07-29 MED ORDER — ALBUTEROL SULFATE (2.5 MG/3ML) 0.083% IN NEBU: 2.5000 mg | INHALATION_SOLUTION | Freq: Three times a day (TID) | RESPIRATORY_TRACT | Status: DC | PRN

## 2019-07-29 MED ORDER — FUROSEMIDE 10 MG/ML IJ SOLN
40.0000 mg | Freq: Once | INTRAMUSCULAR | Status: AC
  Administered 2019-07-29: 40 mg via INTRAVENOUS
  Filled 2019-07-29: qty 4

## 2019-07-29 MED ORDER — POLYETHYLENE GLYCOL 3350 17 G PO PACK
17.0000 g | PACK | Freq: Every day | ORAL | Status: DC
  Administered 2019-07-30 – 2019-08-01 (×3): 17 g via ORAL
  Filled 2019-07-29 (×3): qty 1

## 2019-07-29 MED ORDER — FLUTICASONE FUROATE-VILANTEROL 100-25 MCG/INH IN AEPB
1.0000 | INHALATION_SPRAY | Freq: Every day | RESPIRATORY_TRACT | Status: DC
  Administered 2019-07-30 – 2019-08-03 (×5): 1 via RESPIRATORY_TRACT
  Filled 2019-07-29: qty 28

## 2019-07-29 MED ORDER — DIVALPROEX SODIUM 250 MG PO DR TAB
250.0000 mg | DELAYED_RELEASE_TABLET | Freq: Two times a day (BID) | ORAL | Status: DC
  Administered 2019-07-29 – 2019-08-03 (×10): 250 mg via ORAL
  Filled 2019-07-29 (×10): qty 1

## 2019-07-29 MED ORDER — SENNOSIDES-DOCUSATE SODIUM 8.6-50 MG PO TABS: 1.0000 | ORAL_TABLET | Freq: Every evening | ORAL | Status: DC | PRN

## 2019-07-29 MED ORDER — FLUTICASONE FUROATE-VILANTEROL 100-25 MCG/INH IN AEPB
2.0000 | INHALATION_SPRAY | Freq: Every day | RESPIRATORY_TRACT | Status: DC
  Filled 2019-07-29: qty 28

## 2019-07-29 MED ORDER — APIXABAN 2.5 MG PO TABS
2.5000 mg | ORAL_TABLET | Freq: Two times a day (BID) | ORAL | Status: DC
  Administered 2019-07-29 – 2019-08-03 (×10): 2.5 mg via ORAL
  Filled 2019-07-29 (×10): qty 1

## 2019-07-29 MED ORDER — ATORVASTATIN CALCIUM 10 MG PO TABS
10.0000 mg | ORAL_TABLET | Freq: Every day | ORAL | Status: DC
  Administered 2019-07-29 – 2019-07-31 (×3): 10 mg via ORAL
  Filled 2019-07-29 (×3): qty 1

## 2019-07-29 MED ORDER — DILTIAZEM HCL-DEXTROSE 100-5 MG/100ML-% IV SOLN (PREMIX): 5.0000 mg/h | INTRAVENOUS | Status: DC

## 2019-07-29 NOTE — ED Provider Notes (Signed)
[facility] EMERGENCY DEPARTMENT Provider Note   CSN: [id number] Arrival date & time: 07/29/19  [id number]     History   Chief Complaint Chief Complaint  Patient presents with   Fall    HPI Nicole Barker is a 85 y.o. female.     Patient is an 85 year old female with a history of A. fib on Eliquis, dementia, hypertension who lives at a memory care unit and has had frequent falls in the last week.  Patient was just seen here and discharged 2 hours ago back to her facility.  Facility reports that patient was found on the floor with yet again another unwitnessed fall.  They reported patient had a laceration to her head however there is no evidence of that per EMS.  When patient was seen earlier today she had CT of her head, neck, x-rays of her shoulder, thoracic and lumbar spine which all showed no acute findings.  Patient's fracture of her lumbar spine was present from a fall 3 days ago.  Patient denies any pain at this time and she is awake and speaking.  She cannot give further details of how she fell.  The history is provided by the EMS personnel and the nursing home. The history is limited by the absence of a caregiver.  Fall This is a recurrent problem. The current episode started less than 1 hour ago. The problem occurs constantly. The problem has not changed since onset.Associated symptoms comments: Abrasion to the left lower leg. Nothing aggravates the symptoms. Nothing relieves the symptoms. She has tried nothing for the symptoms.    Past Medical History:  Diagnosis Date   Anxiety    Asthma    Atrial fibrillation ([place])    Dementia ([place])    Hyperlipidemia    Hypertension     Patient Active Problem List   Diagnosis Date Noted   Pneumonia 07/07/2019   Acute on chronic congestive heart failure ([place])    SOB (shortness of breath) 12/15/2018   Bradycardia 12/15/2018   Cardiomyopathy ([place]) 11/22/2018   Chronic anticoagulation 11/22/2018    Acute heart failure ([place]) 10/25/2018   PAF (paroxysmal atrial fibrillation) ([place]) 10/25/2018   Dementia without behavioral disturbance ([place]) 10/25/2018   Essential hypertension 10/25/2018   Hyperlipidemia 10/25/2018   CRI (chronic renal insufficiency), stage 3 (moderate) ([place]) 10/25/2018    Past Surgical History:  Procedure Laterality Date   ABDOMINAL HYSTERECTOMY     BACK SURGERY     JOINT REPLACEMENT     knee     OB History   No obstetric history on file.      Home Medications    Prior to Admission medications   Medication Sig Start Date End Date Taking? Authorizing Provider  acetaminophen (TYLENOL) 325 MG tablet Take 2 tablets (650 mg total) by mouth every 6 (six) hours as needed for mild pain or headache. 11/01/18   Sheikh, Georgina Quint Latif, DO  albuterol (PROVENTIL) (2.5 MG/3ML) 0.083% nebulizer solution Take 2.5 mg by nebulization every 8 (eight) hours as needed for wheezing or shortness of breath.    [provider]  apixaban (ELIQUIS) 2.5 MG TABS tablet Take 1 tablet (2.5 mg total) by mouth 2 (two) times daily. 07/09/19   Sherene Sires, DO  atorvastatin (LIPITOR) 10 MG tablet Take 10 mg by mouth at bedtime.    [provider]  BREO ELLIPTA 100-25 MCG/INH AEPB Inhale 2 puffs into the lungs daily.  06/09/19   [provider]  busPIRone (BUSPAR) 5  MG tablet Take 1 tablet (5 mg total) by mouth 3 (three) times daily as needed (for anxiety (frequent subjective SOB)). 07/09/19   Sherene Sires, DO  divalproex (DEPAKOTE) 250 MG DR tablet Take 250 mg by mouth 2 (two) times daily.    [provider]  escitalopram (LEXAPRO) 10 MG tablet Take 10 mg by mouth daily. 06/22/19   [provider]  fluticasone (FLONASE) 50 MCG/ACT nasal spray Place 1 spray into both nostrils daily. Patient not taking: Reported on 07/26/2019 11/02/18   Raiford Noble Latif, DO  furosemide (LASIX) 40 MG tablet Take 1 tablet (40 mg total) by mouth daily. 01/03/19 07/28/28   Erlene Quan, PA-C  gabapentin (NEURONTIN) 100 MG capsule Take 100 mg by mouth 2 (two) times daily.    [provider]  Melatonin 5 MG TABS Take 5 mg by mouth at bedtime.    [provider]  Multiple Vitamin (MULTIVITAMIN WITH MINERALS) TABS tablet Take 1 tablet by mouth daily.    [provider]  polyethylene glycol (MIRALAX / GLYCOLAX) packet Take 17 g by mouth daily. 11/01/18   Sheikh, Omair Latif, DO  senna-docusate (SENOKOT-S) 8.6-50 MG tablet Take 1 tablet by mouth at bedtime as needed for mild constipation. Patient not taking: Reported on 07/29/2019 11/01/18   Kerney Elbe, DO    Family History Family History  Problem Relation Age of Onset   Hypertension Mother     Social History Social History   Tobacco Use   Smoking status: Never Smoker   Smokeless tobacco: Never Used  Substance Use Topics   Alcohol use: Never    Frequency: Never   Drug use: Never     Allergies   Iodine   Review of Systems Review of Systems  Unable to perform ROS: Dementia     Physical Exam Updated Vital Signs BP 107/85 (BP Location: Right Arm)    Pulse (!) 120    Temp 97.8 F (36.6 C) (Oral)    Resp 17    Ht 4\' 11"  (1.499 m)    Wt 71.9 kg    SpO2 92%    BMI 32.02 kg/m   Physical Exam Vitals signs and nursing note reviewed.  Constitutional:      General: She is not in acute distress.    Appearance: She is well-developed and normal weight.  HENT:     Head: Normocephalic and atraumatic.     Mouth/Throat:     Mouth: Mucous membranes are moist.  Eyes:     Extraocular Movements: Extraocular movements intact.     Pupils: Pupils are equal, round, and reactive to light.  Neck:     Musculoskeletal: Normal range of motion and neck supple. No muscular tenderness.  Cardiovascular:     Rate and Rhythm: Tachycardia present. Rhythm irregularly irregular.     Heart sounds: Normal heart sounds. No murmur. No friction rub.  Pulmonary:     Effort: Pulmonary  effort is normal.     Breath sounds: Normal breath sounds. No wheezing or rales.  Abdominal:     General: Bowel sounds are normal. There is no distension.     Palpations: Abdomen is soft.     Tenderness: There is no abdominal tenderness. There is no guarding or rebound.  Musculoskeletal: Normal range of motion.        General: No tenderness.     Right hip: Normal.     Left hip: Normal.       Arms:  Right lower leg: No edema.     Left lower leg: No edema.       Legs:     Comments: No edema.  Superficial abrasions present on the left shoulder and left anterior tib-fib  Skin:    General: Skin is warm and dry.     Findings: No rash.  Neurological:     Mental Status: She is alert. Mental status is at baseline.     Cranial Nerves: No cranial nerve deficit.     Comments: Awake and alert.  Able to answer yes and no questions.  Oriented to self.  Moving all 4 extremities without difficulty.  Psychiatric:     Comments: Calm and cooperative      ED Treatments / Results  Labs (all labs ordered are listed, but only abnormal results are displayed) Labs Reviewed  CBC WITH DIFFERENTIAL/PLATELET - Abnormal; Notable for the following components:      Result Value   WBC 15.9 (*)    RDW 18.7 (*)    nRBC 1.1 (*)    Neutro Abs 13.1 (*)    Monocytes Absolute 1.5 (*)    Abs Immature Granulocytes 0.13 (*)    All other components within normal limits  COMPREHENSIVE METABOLIC PANEL - Abnormal; Notable for the following components:   Chloride 97 (*)    Glucose, Bld 104 (*)    BUN 38 (*)    Creatinine, Ser 1.62 (*)    Total Protein 5.4 (*)    Albumin 2.9 (*)    AST 62 (*)    Total Bilirubin 3.0 (*)    GFR calc non Af Amer 28 (*)    GFR calc Af Amer 33 (*)    Anion gap 17 (*)    All other components within normal limits  BRAIN NATRIURETIC PEPTIDE - Abnormal; Notable for the following components:   B Natriuretic Peptide 1,882.7 (*)    All other components within normal limits     EKG None  Radiology Dg Thoracic Spine W/swimmers  Result Date: 07/29/2019 CLINICAL DATA:  Fall at nursing home, unwitnessed. EXAM: THORACIC SPINE - 3 VIEWS COMPARISON:  None. FINDINGS: Thoracic vertebral body heights are preserved, with no fracture or subluxation. Moderate flowing osteophytes throughout the thoracic spine. No suspicious focal osseous lesions. Aortic atherosclerosis. Surgical clips are seen in the right upper quadrant of the abdomen. IMPRESSION: No thoracic spine fracture or subluxation. Moderate thoracic spondylosis. Electronically Signed   By: Ilona Sorrel M.D.   On: 07/29/2019 06:24   Dg Lumbar Spine Complete  Result Date: 07/29/2019 CLINICAL DATA:  Unwitnessed fall. EXAM: LUMBAR SPINE - COMPLETE 4+ VIEW COMPARISON:  None. FINDINGS: This report assumes 5 non rib-bearing lumbar vertebrae. There is an acute anterior inferior right L4 vertebral corner fracture. No additional fractures. Severe multilevel degenerative disc disease in the lumbar spine. There is 7 mm anterolisthesis L3-4. Mild bilateral lower lumbar facet arthropathy. No aggressive appearing focal osseous lesions. Abdominal aortic atherosclerosis. IMPRESSION: 1. Acute anterior inferior L4 vertebral corner fracture. 2. Anterolisthesis at L3-4 measuring 7 mm. 3. Severe multilevel lumbar degenerative disc disease. Electronically Signed   By: Ilona Sorrel M.D.   On: 07/29/2019 06:29   Ct Head Wo Contrast  Result Date: 07/29/2019 CLINICAL DATA:  Fall while on anti coagulation therapy EXAM: CT HEAD WITHOUT CONTRAST CT CERVICAL SPINE WITHOUT CONTRAST TECHNIQUE: Multidetector CT imaging of the head and cervical spine was performed following the standard protocol without intravenous contrast. Multiplanar CT image reconstructions of the cervical  spine were also generated. COMPARISON:  Head CT 07/26/2019 and cervical spine CT 07/26/2019 FINDINGS: CT HEAD FINDINGS Brain: There is no mass, hemorrhage or extra-axial collection. There  is generalized atrophy without lobar predilection. There is hypoattenuation of the periventricular white matter, most commonly indicating chronic ischemic microangiopathy. Vascular: Atherosclerotic calcification of the internal carotid arteries at the skull base. No abnormal hyperdensity of the major intracranial arteries or dural venous sinuses. Skull: The visualized skull base, calvarium and extracranial soft tissues are normal. Sinuses/Orbits: No fluid levels or advanced mucosal thickening of the visualized paranasal sinuses. No mastoid or middle ear effusion. The orbits are normal. CT CERVICAL SPINE FINDINGS Alignment: No static subluxation. Facets are aligned. Occipital condyles are normally positioned. Skull base and vertebrae: No acute fracture. Soft tissues and spinal canal: No prevertebral fluid or swelling. No visible canal hematoma. Disc levels: No advanced spinal canal or neural foraminal stenosis. Multilevel degenerative changes. Upper chest: Bilateral pleural effusions. Other: Calcific aortic atherosclerosis. IMPRESSION: 1. No acute intracranial abnormality. Atrophy, chronic ischemic microangiopathy and intracranial atherosclerosis. 2. No fracture or static subluxation of the cervical spine. Electronically Signed   By: Ulyses Jarred M.D.   On: 07/29/2019 06:16   Ct Cervical Spine Wo Contrast  Result Date: 07/29/2019 CLINICAL DATA:  Fall while on anti coagulation therapy EXAM: CT HEAD WITHOUT CONTRAST CT CERVICAL SPINE WITHOUT CONTRAST TECHNIQUE: Multidetector CT imaging of the head and cervical spine was performed following the standard protocol without intravenous contrast. Multiplanar CT image reconstructions of the cervical spine were also generated. COMPARISON:  Head CT 07/26/2019 and cervical spine CT 07/26/2019 FINDINGS: CT HEAD FINDINGS Brain: There is no mass, hemorrhage or extra-axial collection. There is generalized atrophy without lobar predilection. There is hypoattenuation of the  periventricular white matter, most commonly indicating chronic ischemic microangiopathy. Vascular: Atherosclerotic calcification of the internal carotid arteries at the skull base. No abnormal hyperdensity of the major intracranial arteries or dural venous sinuses. Skull: The visualized skull base, calvarium and extracranial soft tissues are normal. Sinuses/Orbits: No fluid levels or advanced mucosal thickening of the visualized paranasal sinuses. No mastoid or middle ear effusion. The orbits are normal. CT CERVICAL SPINE FINDINGS Alignment: No static subluxation. Facets are aligned. Occipital condyles are normally positioned. Skull base and vertebrae: No acute fracture. Soft tissues and spinal canal: No prevertebral fluid or swelling. No visible canal hematoma. Disc levels: No advanced spinal canal or neural foraminal stenosis. Multilevel degenerative changes. Upper chest: Bilateral pleural effusions. Other: Calcific aortic atherosclerosis. IMPRESSION: 1. No acute intracranial abnormality. Atrophy, chronic ischemic microangiopathy and intracranial atherosclerosis. 2. No fracture or static subluxation of the cervical spine. Electronically Signed   By: Ulyses Jarred M.D.   On: 07/29/2019 06:16   Dg Chest Portable 1 View  Result Date: 07/29/2019 CLINICAL DATA:  Hypoxia EXAM: PORTABLE CHEST 1 VIEW COMPARISON:  07/07/2019 and prior exam FINDINGS: Cardiomegaly and mild pulmonary vascular congestion noted. Mild bibasilar atelectasis versus airspace disease noted. There may be very small bilateral pleural effusions present. No pneumothorax noted. No acute bony abnormalities are identified. IMPRESSION: Cardiomegaly, mild pulmonary vascular congestion with mild bibasilar atelectasis/airspace disease. Question very small bilateral pleural effusions. Electronically Signed   By: Margarette Canada M.D.   On: 07/29/2019 11:36   Dg Shoulder Left  Result Date: 07/29/2019 CLINICAL DATA:  Unwitnessed fall. EXAM: LEFT SHOULDER - 2+  VIEW COMPARISON:  None. FINDINGS: High-riding left humeral head. No fracture. No suspicious focal osseous lesions. No evidence of left acromioclavicular separation. No left glenohumeral dislocation.  No radiopaque foreign bodies. Moderate left acromioclavicular osteoarthritis. Moderate left glenohumeral joint osteoarthritis. IMPRESSION: No fracture or dislocation in the left shoulder. High-riding left humeral head suggests complete rotator cuff tear or CPPD arthropathy. Moderate left acromioclavicular and glenohumeral joint osteoarthritis. Electronically Signed   By: Ilona Sorrel M.D.   On: 07/29/2019 06:26    Procedures Procedures (including critical care time)  Medications Ordered in ED Medications  metoprolol tartrate (LOPRESSOR) injection 5 mg (has no administration in time range)     Initial Impression / Assessment and Plan / ED Course  I have reviewed the triage vital signs and the nursing notes.  Pertinent labs & imaging results that were available during my care of the patient were reviewed by me and considered in my medical decision making (see chart for details).        Elderly female who lives at a memory care unit returning today after another unwitnessed fall.  Patient was discharged from the emergency room 2 hours ago and they found her on the floor next to her bed.  Patient when seen earlier had CT of her head, neck and x-rays of her thoracic, lumbar spine and her shoulder.  All imaging was negative for acute injury but she has had multiple falls in the last week.  Patient when she arrived here had a heart rate in the 140s and EKG showed A. fib with RVR.  Patient when she was seen prior last night had a heart rate of 130 but no EKG was done.  She had blood work done 3 days ago that showed normal hemoglobin, mild AKI and she had just recently received Levaquin for possible UTI.  Patient is in no acute distress at this time.  She is having no respiratory symptoms.  Will give rate  control with Lopressor as patient does not take chronic rate control.  Will recheck labs to ensure no acute changes.  Patient otherwise does not need repeat imaging as she has only a mild abrasion on her left tib-fib and moves all extremities without difficulty.  It was already discussed that she most likely should be taken off Eliquis due to her frequent falls.  Looking back over the last several EKGs patient has been in A. fib with RVR and during recent hospitalization on 07/09/2019 patient was noted to have heart rates between 60 and 110.  Discussing with family at that time patient has had negative impact with chronic rate control related to severe bradycardia.  Today will give patient a small dose of Lopressor 2.5 to improve heart rates down to the low 100s.  10:51 AM Patient's daughter is now present and states that patient is actually living in the assisted portion of the facility and in the last 3 weeks since discharge from the hospital she has had a rapid decline and had multiple falls.  For the last 2 days patient has refused anything by mouth and has not eaten or drank anything as well is not taking any medications.  The facility is no longer willing to take her back as they feel like she needs a higher level of care.  Daughter states prior to hospitalization she was able to get out of bed, dress herself and eat without difficulty and now she cannot do any of these things.  12:28 PM Patient is hypoxic here requiring 2 L of oxygen.  Chest x-ray is consistent with evidence of fluid overload as well as BNP that is now 1800 which is elevated from prior.  Creatinine is not significantly changed from her last test 3 days ago but is elevated from when she was hospitalized.  Patient was given IV Lasix but feel she needs admission for CHF exacerbation, uncontrolled A. fib as she may need to be restarted on rate therapy.  Also she will will need evaluation by PT and placement in a higher level of  care.  CRITICAL CARE Performed by: Candia Kingsbury Total critical care time: 30 minutes Critical care time was exclusive of separately billable procedures and treating other patients. Critical care was necessary to treat or prevent imminent or life-threatening deterioration. Critical care was time spent personally by me on the following activities: development of treatment plan with patient and/or surrogate as well as nursing, discussions with consultants, evaluation of patient's response to treatment, examination of patient, obtaining history from patient or surrogate, ordering and performing treatments and interventions, ordering and review of laboratory studies, ordering and review of radiographic studies, pulse oximetry and re-evaluation of patient's condition.   Final Clinical Impressions(s) / ED Diagnoses   Final diagnoses:  None    ED Discharge Orders    None       Blanchie Dessert, MD 07/29/19 1229

## 2019-07-29 NOTE — ED Provider Notes (Signed)
[facility] EMERGENCY DEPARTMENT Provider Note   CSN: [id number] Arrival date & time: 07/29/19  0507    History   Chief Complaint Chief Complaint  Patient presents with   Fall    HPI Nicole Barker is a 85 y.o. female.   The history is provided by the nursing home and the EMS personnel. The history is limited by the condition of the patient (Dementia).  Fall  She has a history of heart failure, renal insufficiency, hypertension, hyperlipidemia, paroxysmal atrial fibrillation was brought in by ambulance after an unwitnessed fall at the nursing home where she lives.  Per EMS, she was complaining of pain in her mid and lower back although patient is actually denying any pain.  She is anticoagulated on apixaban because of atrial fibrillation.  Past Medical History:  Diagnosis Date   Anxiety    Asthma    Atrial fibrillation ([place])    Dementia ([place])    Hyperlipidemia    Hypertension     Patient Active Problem List   Diagnosis Date Noted   Pneumonia 07/07/2019   Acute on chronic congestive heart failure ([place])    SOB (shortness of breath) 12/15/2018   Bradycardia 12/15/2018   Cardiomyopathy ([place]) 11/22/2018   Chronic anticoagulation 11/22/2018   Acute heart failure ([place]) 10/25/2018   PAF (paroxysmal atrial fibrillation) ([place]) 10/25/2018   Dementia without behavioral disturbance ([place]) 10/25/2018   Essential hypertension 10/25/2018   Hyperlipidemia 10/25/2018   CRI (chronic renal insufficiency), stage 3 (moderate) ([place]) 10/25/2018    Past Surgical History:  Procedure Laterality Date   ABDOMINAL HYSTERECTOMY     BACK SURGERY     JOINT REPLACEMENT     knee     OB History   No obstetric history on file.      Home Medications    Prior to Admission medications   Medication Sig Start Date End Date Taking? Authorizing Provider  acetaminophen (TYLENOL) 325 MG tablet Take 2 tablets (650 mg total) by mouth every 6 (six) hours  as needed for mild pain or headache. 11/01/18   Sheikh, Georgina Quint Latif, DO  albuterol (ACCUNEB) 0.63 MG/3ML nebulizer solution Take 1 ampule by nebulization every 8 (eight) hours as needed for shortness of breath.    [provider]  apixaban (ELIQUIS) 2.5 MG TABS tablet Take 1 tablet (2.5 mg total) by mouth 2 (two) times daily. 07/09/19   Sherene Sires, DO  atorvastatin (LIPITOR) 10 MG tablet Take 10 mg by mouth at bedtime.    [provider]  BREO ELLIPTA 100-25 MCG/INH AEPB Inhale 2 puffs into the lungs every morning. 06/09/19   [provider]  busPIRone (BUSPAR) 5 MG tablet Take 1 tablet (5 mg total) by mouth 3 (three) times daily as needed (for anxiety (frequent subjective SOB)). 07/09/19   Sherene Sires, DO  divalproex (DEPAKOTE) 250 MG DR tablet Take 250 mg by mouth 2 (two) times daily.    [provider]  doxycycline (VIBRAMYCIN) 100 MG capsule Take 100 mg by mouth 2 (two) times daily. 07/17/19   [provider]  escitalopram (LEXAPRO) 10 MG tablet Take 10 mg by mouth daily. 06/22/19   [provider]  fluticasone (FLONASE) 50 MCG/ACT nasal spray Place 1 spray into both nostrils daily. Patient not taking: Reported on 07/26/2019 11/02/18   Raiford Noble Latif, DO  furosemide (LASIX) 40 MG tablet Take 1 tablet (40 mg total) by mouth daily. 01/03/19 07/26/19  Erlene Quan, PA-C  gabapentin (NEURONTIN) 100 MG  capsule Take 100 mg by mouth 2 (two) times daily.    [provider]  Melatonin 5 MG TABS Take 5 mg by mouth at bedtime.    [provider]  Multiple Vitamins-Minerals (CENTRUM SILVER 50+WOMEN) TABS Take 1 tablet by mouth daily.    [provider]  polyethylene glycol (MIRALAX / GLYCOLAX) packet Take 17 g by mouth daily. 11/01/18   Sheikh, Omair Latif, DO  senna-docusate (SENOKOT-S) 8.6-50 MG tablet Take 1 tablet by mouth at bedtime as needed for mild constipation. 11/01/18   Kerney Elbe, DO    Family  History Family History  Problem Relation Age of Onset   Hypertension Mother     Social History Social History   Tobacco Use   Smoking status: Never Smoker   Smokeless tobacco: Never Used  Substance Use Topics   Alcohol use: Never    Frequency: Never   Drug use: Never     Allergies   Iodine   Review of Systems Review of Systems  Unable to perform ROS: Dementia     Physical Exam Updated Vital Signs BP (!) 126/95    Pulse (!) 130    Temp 98.6 F (37 C) (Tympanic)    Resp (!) 21    Ht 4\' 11"  (1.499 m)    Wt 71.9 kg    SpO2 96%    BMI 32.02 kg/m   Physical Exam Vitals signs and nursing note reviewed.    85 year old female, resting comfortably and in no acute distress. Vital signs are significant for elevated blood pressure and heart rate.  Monitor shows she is in atrial fibrillation which will intermittently stop and go down to normal rate. Oxygen saturation is 96%, which is normal. Head is normocephalic and atraumatic. PERRLA, EOMI. Oropharynx is clear. Neck is immobilized in a stiff cervical collar and is nontender without adenopathy or JVD. Back is mildly tender in the lower thoracic and upper lumbar area.  There is no CVA tenderness. Lungs are clear without rales, wheezes, or rhonchi. Chest is nontender. Heart has regular rate and rhythm without murmur. Abdomen is soft, flat, nontender without masses or hepatosplenomegaly and peristalsis is normoactive. Extremities have no cyanosis or edema, full range of motion is present.  Abrasion is noted on the posterior aspect of the left shoulder. Skin is warm and dry without rash. Neurologic: Mental status is normal, cranial nerves are intact, there are no motor or sensory deficits.  ED Treatments / Results   Radiology Dg Thoracic Spine W/swimmers  Result Date: 07/29/2019 CLINICAL DATA:  Fall at nursing home, unwitnessed. EXAM: THORACIC SPINE - 3 VIEWS COMPARISON:  None. FINDINGS: Thoracic vertebral body heights are  preserved, with no fracture or subluxation. Moderate flowing osteophytes throughout the thoracic spine. No suspicious focal osseous lesions. Aortic atherosclerosis. Surgical clips are seen in the right upper quadrant of the abdomen. IMPRESSION: No thoracic spine fracture or subluxation. Moderate thoracic spondylosis. Electronically Signed   By: Ilona Sorrel M.D.   On: 07/29/2019 06:24   Dg Lumbar Spine Complete  Result Date: 07/29/2019 CLINICAL DATA:  Unwitnessed fall. EXAM: LUMBAR SPINE - COMPLETE 4+ VIEW COMPARISON:  None. FINDINGS: This report assumes 5 non rib-bearing lumbar vertebrae. There is an acute anterior inferior right L4 vertebral corner fracture. No additional fractures. Severe multilevel degenerative disc disease in the lumbar spine. There is 7 mm anterolisthesis L3-4. Mild bilateral lower lumbar facet arthropathy. No aggressive appearing focal osseous lesions. Abdominal aortic atherosclerosis. IMPRESSION: 1. Acute anterior inferior  L4 vertebral corner fracture. 2. Anterolisthesis at L3-4 measuring 7 mm. 3. Severe multilevel lumbar degenerative disc disease. Electronically Signed   By: Ilona Sorrel M.D.   On: 07/29/2019 06:29   Ct Head Wo Contrast  Result Date: 07/29/2019 CLINICAL DATA:  Fall while on anti coagulation therapy EXAM: CT HEAD WITHOUT CONTRAST CT CERVICAL SPINE WITHOUT CONTRAST TECHNIQUE: Multidetector CT imaging of the head and cervical spine was performed following the standard protocol without intravenous contrast. Multiplanar CT image reconstructions of the cervical spine were also generated. COMPARISON:  Head CT 07/26/2019 and cervical spine CT 07/26/2019 FINDINGS: CT HEAD FINDINGS Brain: There is no mass, hemorrhage or extra-axial collection. There is generalized atrophy without lobar predilection. There is hypoattenuation of the periventricular white matter, most commonly indicating chronic ischemic microangiopathy. Vascular: Atherosclerotic calcification of the internal  carotid arteries at the skull base. No abnormal hyperdensity of the major intracranial arteries or dural venous sinuses. Skull: The visualized skull base, calvarium and extracranial soft tissues are normal. Sinuses/Orbits: No fluid levels or advanced mucosal thickening of the visualized paranasal sinuses. No mastoid or middle ear effusion. The orbits are normal. CT CERVICAL SPINE FINDINGS Alignment: No static subluxation. Facets are aligned. Occipital condyles are normally positioned. Skull base and vertebrae: No acute fracture. Soft tissues and spinal canal: No prevertebral fluid or swelling. No visible canal hematoma. Disc levels: No advanced spinal canal or neural foraminal stenosis. Multilevel degenerative changes. Upper chest: Bilateral pleural effusions. Other: Calcific aortic atherosclerosis. IMPRESSION: 1. No acute intracranial abnormality. Atrophy, chronic ischemic microangiopathy and intracranial atherosclerosis. 2. No fracture or static subluxation of the cervical spine. Electronically Signed   By: Ulyses Jarred M.D.   On: 07/29/2019 06:16   Ct Cervical Spine Wo Contrast  Result Date: 07/29/2019 CLINICAL DATA:  Fall while on anti coagulation therapy EXAM: CT HEAD WITHOUT CONTRAST CT CERVICAL SPINE WITHOUT CONTRAST TECHNIQUE: Multidetector CT imaging of the head and cervical spine was performed following the standard protocol without intravenous contrast. Multiplanar CT image reconstructions of the cervical spine were also generated. COMPARISON:  Head CT 07/26/2019 and cervical spine CT 07/26/2019 FINDINGS: CT HEAD FINDINGS Brain: There is no mass, hemorrhage or extra-axial collection. There is generalized atrophy without lobar predilection. There is hypoattenuation of the periventricular white matter, most commonly indicating chronic ischemic microangiopathy. Vascular: Atherosclerotic calcification of the internal carotid arteries at the skull base. No abnormal hyperdensity of the major intracranial  arteries or dural venous sinuses. Skull: The visualized skull base, calvarium and extracranial soft tissues are normal. Sinuses/Orbits: No fluid levels or advanced mucosal thickening of the visualized paranasal sinuses. No mastoid or middle ear effusion. The orbits are normal. CT CERVICAL SPINE FINDINGS Alignment: No static subluxation. Facets are aligned. Occipital condyles are normally positioned. Skull base and vertebrae: No acute fracture. Soft tissues and spinal canal: No prevertebral fluid or swelling. No visible canal hematoma. Disc levels: No advanced spinal canal or neural foraminal stenosis. Multilevel degenerative changes. Upper chest: Bilateral pleural effusions. Other: Calcific aortic atherosclerosis. IMPRESSION: 1. No acute intracranial abnormality. Atrophy, chronic ischemic microangiopathy and intracranial atherosclerosis. 2. No fracture or static subluxation of the cervical spine. Electronically Signed   By: Ulyses Jarred M.D.   On: 07/29/2019 06:16   Dg Shoulder Left  Result Date: 07/29/2019 CLINICAL DATA:  Unwitnessed fall. EXAM: LEFT SHOULDER - 2+ VIEW COMPARISON:  None. FINDINGS: High-riding left humeral head. No fracture. No suspicious focal osseous lesions. No evidence of left acromioclavicular separation. No left glenohumeral dislocation. No radiopaque foreign bodies.  Moderate left acromioclavicular osteoarthritis. Moderate left glenohumeral joint osteoarthritis. IMPRESSION: No fracture or dislocation in the left shoulder. High-riding left humeral head suggests complete rotator cuff tear or CPPD arthropathy. Moderate left acromioclavicular and glenohumeral joint osteoarthritis. Electronically Signed   By: Ilona Sorrel M.D.   On: 07/29/2019 06:26    Procedures Procedures   Medications Ordered in ED Medications - No data to display   Initial Impression / Assessment and Plan / ED Course  I have reviewed the triage vital signs and the nursing notes.  Pertinent imaging results that  were available during my care of the patient were reviewed by me and considered in my medical decision making (see chart for details).  Unwitnessed fall without evidence of serious injury.  However, because she is anticoagulated, will need to get CT of head and cervical spine.  We will also check plain x-rays of left shoulder, thoracic spine, lumbar spine.  Old records are reviewed, and she has been in the emergency department 2 other times in the past week with falls.. On August 5, she did have a fracture of L4 which may account for her back pain.  With 3 falls in less than a week, risk of anticoagulation needs to be assessed and compared with the benefits.  CT scans showed no acute injury.  X-rays of left shoulder and thoracic spine show no acute injury.  X-rays of lumbar spine show previous identified fracture of L4.  No evidence of serious injury with this fall.  She is discharged with instructions to continue routine care of her previously known fracture.  I requested that she discuss with her PCP the relative risks and benefits of continuing to use anticoagulants.  Final Clinical Impressions(s) / ED Diagnoses   Final diagnoses:  Fall at nursing home, initial encounter  Contusion of scalp, initial encounter  Closed fracture of fourth lumbar vertebra with routine healing, subsequent encounter  Paroxysmal atrial fibrillation [facility])  Chronic anticoagulation    ED Discharge Orders    None       Delora Fuel, [place] [id number] [phone]

## 2019-07-29 NOTE — TOC Initial Note (Signed)
Transition of Care [facility]) - Initial/Assessment Note    Patient Details  Name: Nicole Barker MRN: [id number] Date of Birth: 26-Jul-1930  [facility] CM/SW Contact:    Oretha Milch, LCSW Phone Number: 07/29/2019, 1:09 PM  Clinical Narrative: CSW contacted by RN CM regarding referring patient to SNF facilities. Patient currently resides in assisted-living and has several hospital visits recently due to falls and family reported decline of functioning. CSW spoke with family and noted patient had been in SNF before and has been successful. CSW notes patient has not spoken and per family has not eaten in several days. CSW notes continued medical work-up is neccessary for SNF referral.  CSW notes family has no specific placement preference and would like to begin search in [facility].                   Expected Discharge Plan: Skilled Nursing Facility Barriers to Discharge: Insurance Authorization, Continued Medical Work up   Patient Goals and CMS Choice Patient states their goals for this hospitalization and ongoing recovery are:: "I want to be at home and nt in the hospital." CMS Medicare.gov Compare Post Acute Care list provided to:: Patient Represenative (must comment) Choice offered to / list presented to : Adult Children  Expected Discharge Plan and Services Expected Discharge Plan: [place] Choice: [place] Living arrangements for the past 2 months: [place]                                      Prior Living Arrangements/Services Living arrangements for the past 2 months: [place] Lives with:: Facility Resident Patient language and need for interpreter reviewed:: Yes(Patient has significant difficulty speaking) Do you feel safe going back to the place where you live?: No      Need for Family Participation in Patient Care: Yes (Comment) Care giver support system in  place?: Yes (comment) Current home services: DME, Home OT, Home PT Criminal Activity/Legal Involvement Pertinent to Current Situation/Hospitalization: No - Comment as needed  Activities of Daily Living      Permission Sought/Granted   Permission granted to share information with : Yes, Release of Information Signed  Share Information with NAME: Facility contacts, emergency contacts     Permission granted to share info w Relationship: Daughter  Permission granted to share info w Contact Information: [phone]  Emotional Assessment Appearance:: Appears stated age Attitude/Demeanor/Rapport: Unable to Assess Affect (typically observed): Unable to Assess Orientation: : (Unable to assess) Alcohol / Substance Use: Not Applicable Psych Involvement: No (comment)  Admission diagnosis:  fall Patient Active Problem List   Diagnosis Date Noted  . CHF (congestive heart failure) ([place]) 07/29/2019  . Pneumonia 07/07/2019  . Acute on chronic congestive heart failure ([place])   . SOB (shortness of breath) 12/15/2018  . Bradycardia 12/15/2018  . Cardiomyopathy ([place]) 11/22/2018  . Chronic anticoagulation 11/22/2018  . Acute heart failure ([place]) 10/25/2018  . PAF (paroxysmal atrial fibrillation) ([place]) 10/25/2018  . Dementia without behavioral disturbance ([place]) 10/25/2018  . Essential hypertension 10/25/2018  . Hyperlipidemia 10/25/2018  . CRI (chronic renal insufficiency), stage 3 (moderate) ([place]) 10/25/2018   PCP:  Merlene Laughter, MD Pharmacy:  No Pharmacies Listed    Social Determinants of Health (SDOH) Interventions    Readmission Risk Interventions No flowsheet data found.

## 2019-07-29 NOTE — ED Notes (Signed)
[facility] here to transport patient back to [facility]

## 2019-07-29 NOTE — Progress Notes (Signed)
   Vital Signs MEWS/VS Documentation      07/29/2019 1030 07/29/2019 1513 07/29/2019 1714 07/29/2019 1950   MEWS Score:  5  1  3   0   MEWS Score Color:  Red  Green  Yellow  Green   Resp:  (!) 28  20  -  18   Pulse:  (!) 135  84  (!) 113  95   BP:  (!) 118/91  92/76  99/61  122/84   Temp:  -  98 F (36.7 C)  -  98.2 F (36.8 C)   O2 Device:  -  Nasal Cannula  -  Nasal Cannula   O2 Flow Rate (L/min):  -  -  -  3 L/min   Level of Consciousness:  -  Alert  -  -     Pt getting better. received pt with  MEWS score of 3. Rechecked VS. PT alert 3x, denies complains. Will monitor.      Tanya Nones D Tavis Kring 07/29/2019,8:21 PM

## 2019-07-29 NOTE — H&P (Addendum)
[facility] Admission History and Physical Service Pager: [phone]  Patient name: Nicole Barker Medical record number: [id number] Date of birth: 1930-10-21 Age: 85 y.o. Gender: female  Primary Care Provider: Merlene Laughter, MD Consultants: palliative, CSW, PT/OT/SLP Code Status: DNR Emergency Contact: [name], [phone]  Chief Complaint:  Nicole Barker is a 85 y.o. female presenting with SOB. PMH is significant for P.Afib (on eliquis), HFrEF 45-50% (echo 2019), dementia (lives at [facility] facility), HTN, HLD, CKD3b-4, recurrent falls  Assessment and Plan:  Acute hypoxemic respiratory failure 2/2 acute on chronic HFrEF  h/o Aortic Stenosis Patient has presented from assisted living facility with dyspnea, general deterioration of her health since her last admission to hospital including reduced oral intake, multiple falls and his communication top differentials of dyspnea includes pneumonia and HFrEF. HFrEF likely due to BNP 1882, bilateral crackles on examination, on admission oxygen requirement of 2 L and chest x-ray which shows cardiomegaly, vascular congestion and possible bilateral pleural effusions. Last echo in 2019 showed 45 to 50%. Cr 1.62 which may be new baseline based on last admission. Patient in A. fib with RVR which is likely contributing to dyspnea and increased oxygen requirement. HR 120s to 130s in ED. Pneumonia considered as patient lives in healthcare facility with potential of developing COVID or other bacterial infections however no productive cough or fevers. Patient is COVID negative. -Admit to telemetry-Dr. Andria Frames -Strict I's and O's, Daily weights -Oxygen as needed to keep saturations above 90% -Continue IV Lasix 40 mg -dose lasix in am based on patient's output -consider cardiology consult -replete electrolytes as needed - vitals per routine  A Fib with RVR Patient with HR 130's in ED. EKG with a fib in  RVR. Received IV metoprolol 2.5mg  x1.  -repeat IV metoprolol; then may use digoxin; avoid diltiazem given HFrEF -consult cardiology consult -Cardiac monitoring   Dementia  Recent falls Patient no longer able to stay at current long term facility due to increased need of care. 4 falls in past 7 days with one this am. Imaging below shows no acute change to CT head/neck; DG lumbar with L4 fx previously seen on 8/5; DG shoulder with L shoulder rotator cuff tear. Patient also with progressively declining speech and cognitive functions per daughter report. Concern with slurring of speech starting 3 days ago, but daughter does not wish to pursue CVA work up given would not change management and mom claustrophobic.   -Continue Depakote, recent level wnl -Delirium precautions, sitter if needed -Avoid sedation -palliative consulted given worsening over past month -PT/OT/SLP evaluations -CM and CSW consulted for placement in ED -soft diet for now given daughter's recs -monitor for aspiration  HTN BP 118/91, soft BPs in hospital -Not taking any antihypertensives -Continue to monitor with diuresis  Leukocytosis: WBC 15.9, neutrophils 13.1. No signs of infection on exam. Recently completed tx for pna during last hospitalization. CXR with no signs of pna. UA wnl, urine cx pedning. Afebrile.  -obtain blood cx and start abx if patient becomes febrile.  -discuss GoC with daughter regarding abx treatment.   Hyperlipidemia Atorvastatin 10 mg -Continue atorvastatin  Asthma/COPD Albuterol, Breo Ellipta,  -Continue home inhalers  CKD, Stage III  Cr 1.62, Cr 1.6 on 8/5, appears to be at baseline -Continue to monitor  Chronic pain Acetaminophen, gabapentin 100 mg twice daily -Continue  FEN/GI: soft diet with fluid restriction 1.2 L Prophylaxis: Eliquis  Disposition: Pending medical stabilization, PT recommending SNF  History of Present Illness:  Nicole Barker  is a 85 y.o. female  presenting with dyspnea.  She has been less responsive int he past few days, eating and drinking less. She has had 4 falls in the past 7 days. Daughter [name]) reports that her dementia has greatly worsened over the past mo nth and now she waxes and wanes. She was sent because she has been gasping for air, and she has been too weak to stand per the med techs at the facility. She has not been able to swallow well. She has been crushing pills and putting in applesauce. They have been doing SLP, but daughter has been told that she is significantly declining.    This am she fell and hit her head. Treated for pna in the last 10 days. Her current facility can no longer take her. She has been living there for about 1 year and did well until covid. Daughter has a case manager attempting to find her another bed, but in the meantime she will be admitted for her new oxygen requirement and fluid overload. She has catheter in place now   Daughter reports no fevers, chills, cough and recent COVID negative test. Daughter says she does really report of dysuria, but she is really not communicating. She has had 2 recent episodes of diarrhea.   Review Of Systems: Per HPI with the following additions:   Review of Systems  Unable to perform ROS: Dementia    Patient Active Problem List   Diagnosis Date Noted  . CHF (congestive heart failure) ([place]) 07/29/2019  . Pneumonia 07/07/2019  . Acute on chronic congestive heart failure ([place])   . SOB (shortness of breath) 12/15/2018  . Bradycardia 12/15/2018  . Cardiomyopathy ([place]) 11/22/2018  . Chronic anticoagulation 11/22/2018  . Acute heart failure ([place]) 10/25/2018  . PAF (paroxysmal atrial fibrillation) ([place]) 10/25/2018  . Dementia without behavioral disturbance ([place]) 10/25/2018  . Essential hypertension 10/25/2018  . Hyperlipidemia 10/25/2018  . CRI (chronic renal insufficiency), stage 3 (moderate) ([place]) 10/25/2018    Past Medical History: Past Medical History:   Diagnosis Date  . Anxiety   . Asthma   . Atrial fibrillation ([place])   . Dementia ([place])   . Hyperlipidemia   . Hypertension     Past Surgical History: Past Surgical History:  Procedure Laterality Date  . ABDOMINAL HYSTERECTOMY    . BACK SURGERY    . JOINT REPLACEMENT     knee    Social History: Social History   Tobacco Use  . Smoking status: Never Smoker  . Smokeless tobacco: Never Used  Substance Use Topics  . Alcohol use: Never    Frequency: Never  . Drug use: Never   Additional social history:   Please also refer to relevant sections of EMR.  Family History: Family History  Problem Relation Age of Onset  . Hypertension Mother     Allergies and Medications: Allergies  Allergen Reactions  . Iodine Other (See Comments)    Unknown reaction   No current facility-administered medications on file prior to encounter.    Current Outpatient Medications on File Prior to Encounter  Medication Sig Dispense Refill  . acetaminophen (TYLENOL) 325 MG tablet Take 2 tablets (650 mg total) by mouth every 6 (six) hours as needed for mild pain or headache. 30 tablet 0  . albuterol (PROVENTIL) (2.5 MG/3ML) 0.083% nebulizer solution Take 2.5 mg by nebulization every 8 (eight) hours as needed for wheezing or shortness of breath.    Marland Kitchen apixaban (ELIQUIS) 2.5 MG TABS tablet  Take 1 tablet (2.5 mg total) by mouth 2 (two) times daily. 60 tablet   . atorvastatin (LIPITOR) 10 MG tablet Take 10 mg by mouth at bedtime.    Marland Kitchen BREO ELLIPTA 100-25 MCG/INH AEPB Inhale 2 puffs into the lungs daily.     . busPIRone (BUSPAR) 5 MG tablet Take 1 tablet (5 mg total) by mouth 3 (three) times daily as needed (for anxiety (frequent subjective SOB)).    . divalproex (DEPAKOTE) 250 MG DR tablet Take 250 mg by mouth 2 (two) times daily.    Marland Kitchen escitalopram (LEXAPRO) 10 MG tablet Take 10 mg by mouth daily.    . furosemide (LASIX) 40 MG tablet Take 1 tablet (40 mg total) by mouth daily. 30 tablet 0  . gabapentin  (NEURONTIN) 100 MG capsule Take 100 mg by mouth 2 (two) times daily.    . Melatonin 5 MG TABS Take 5 mg by mouth at bedtime.    . Multiple Vitamins-Minerals (CENTRUM SILVER ADULT 50+ PO) Take 1 tablet by mouth daily.    . polyethylene glycol (MIRALAX / GLYCOLAX) packet Take 17 g by mouth daily. 14 each 0  . senna-docusate (SENOKOT-S) 8.6-50 MG tablet Take 1 tablet by mouth at bedtime as needed for mild constipation. 30 tablet 0  . doxycycline (ADOXA) 100 MG tablet Take 100 mg by mouth 2 (two) times daily. Finished on 07-26-19    . fluticasone (FLONASE) 50 MCG/ACT nasal spray Place 1 spray into both nostrils daily. (Patient not taking: Reported on 07/26/2019) 16 g 2  . levofloxacin (LEVAQUIN) 750 MG tablet Take 750 mg by mouth daily. Started on 07-18-19  Ds 5      Objective: BP 99/61   Pulse (!) 113   Temp 98 F (36.7 C) (Oral)   Resp 20   Ht 4\' 11"  (1.499 m)   Wt 76.2 kg   SpO2 92%   BMI 33.93 kg/m   Exam: General: Sleepy, minimal communication, opens eyes when spoken to speaks a few words Eyes: PERRL, normal extraocular eye movements, no conjunctival pallor or injection, no icterus ENTM: Moist mucous membranes, no pharyngeal erythema or exudate Neck: Supple, tender, no thyroid cyst, thyromegaly, adenopathy Cardiovascular: S1 and S2 present, no 3 or S4.  No rubs or gallops.  Normal capillary refill Respiratory: Bibasilar crackles, no wheeze or rhonchi. normal work of breathing. Gastrointestinal: soft, nontender, nondistended, normoactive BS MSK: moves 4 extremities equally  Derm: no rashes appreciated Neuro: unable assess as patient has dementia Psych: AOx2   Labs and Imaging: CBC BMET  Recent Labs  Lab 07/29/19 1023  WBC 15.9*  HGB 13.8  HCT 43.9  PLT PLATELET CLUMPS NOTED ON SMEAR, COUNT APPEARS DECREASED   Recent Labs  Lab 07/29/19 1023  NA 138  K 4.1  CL 97*  CO2 24  BUN 38*  CREATININE 1.62*  GLUCOSE 104*  CALCIUM 9.2       Lattie Haw, MD 07/29/2019,  7:04 PM PGY-1, [place] Intern pager: [phone], text pages welcome  FPTS Upper-Level Resident Addendum   I have independently interviewed and examined the patient. I have discussed the above with the original author and agree with their documentation. My edits for correction/addition/clarification are in purple. Please see also any attending notes.    [place] Raidyn Wassink, DO PGY-3, [facility] 07/29/2019 9:14 PM  [place] Service pager: [phone] (text pages welcome through [facility])

## 2019-07-29 NOTE — ED Notes (Signed)
Called ptar 

## 2019-07-29 NOTE — ED Triage Notes (Signed)
BIB [facility] from [facility] @ [place] with c/o of unwitnessed fall. Facility unsure of how long pt was on the floor. Reports hitting back of her head. Unknown LOC. Pt on thinners for Afib.

## 2019-07-29 NOTE — NC FL2 (Signed)
[place] MEDICAID FL2 LEVEL OF CARE SCREENING TOOL     IDENTIFICATION  Patient Name: Nicole Barker Birthdate: 1930-08-17 Sex: female Admission Date (Current Location): 07/29/2019  [facility] and [place] Number:  Herbalist and Address:  [facility], [place] [address] [place], [place] [address]      Provider Number: [id number]  Attending Physician Name and Address:  Zenia Resides, MD  Relative Name and Phone Number:  [name], daughter, [phone]    Current Level of Care: Hospital Recommended Level of Care: [place] Prior Approval Number:    Date Approved/Denied: 08/27/17 PASRR Number: [id number]  Discharge Plan: Other (Comment)(Return to assisted living when medically appropriate)    Current Diagnoses: Patient Active Problem List   Diagnosis Date Noted  . CHF (congestive heart failure) ([place]) 07/29/2019  . Pneumonia 07/07/2019  . Acute on chronic congestive heart failure ([place])   . SOB (shortness of breath) 12/15/2018  . Bradycardia 12/15/2018  . Cardiomyopathy ([place]) 11/22/2018  . Chronic anticoagulation 11/22/2018  . Acute heart failure ([place]) 10/25/2018  . PAF (paroxysmal atrial fibrillation) ([place]) 10/25/2018  . Dementia without behavioral disturbance ([place]) 10/25/2018  . Essential hypertension 10/25/2018  . Hyperlipidemia 10/25/2018  . CRI (chronic renal insufficiency), stage 3 (moderate) ([facility]) 10/25/2018    Orientation RESPIRATION BLADDER Height & Weight     (Unable to assess)  Normal(Room air) Continent Weight: 158 lb 8.2 oz (71.9 kg) Height:  4\' 11"  (149.9 cm)  BEHAVIORAL SYMPTOMS/MOOD NEUROLOGICAL BOWEL NUTRITION STATUS  Dangerous to self, others or property   Continent Diet(Pending speech therapy)  AMBULATORY STATUS COMMUNICATION OF NEEDS Skin   Extensive Assist Non-Verbally Bruising                       Personal Care Assistance Level of Assistance  Total care, Bathing, Feeding,  Dressing Bathing Assistance: Maximum assistance Feeding assistance: Maximum assistance Dressing Assistance: Maximum assistance Total Care Assistance: Maximum assistance   Functional Limitations Info             SPECIAL CARE FACTORS FREQUENCY  PT (By licensed PT), OT (By licensed OT)     PT Frequency: Pending eval OT Frequency: Pending eval            Contractures Contractures Info: Not present    Additional Factors Info  Code Status, Allergies Code Status Info: Prior Allergies Info: Iodine           Current Medications (07/29/2019):  This is the current hospital active medication list Current Facility-Administered Medications  Medication Dose Route Frequency Provider Last Rate Last Dose  . furosemide (LASIX) injection 40 mg  40 mg Intravenous Once Blanchie Dessert, MD       Current Outpatient Medications  Medication Sig Dispense Refill  . acetaminophen (TYLENOL) 325 MG tablet Take 2 tablets (650 mg total) by mouth every 6 (six) hours as needed for mild pain or headache. 30 tablet 0  . albuterol (PROVENTIL) (2.5 MG/3ML) 0.083% nebulizer solution Take 2.5 mg by nebulization every 8 (eight) hours as needed for wheezing or shortness of breath.    Marland Kitchen apixaban (ELIQUIS) 2.5 MG TABS tablet Take 1 tablet (2.5 mg total) by mouth 2 (two) times daily. 60 tablet   . atorvastatin (LIPITOR) 10 MG tablet Take 10 mg by mouth at bedtime.    Marland Kitchen BREO ELLIPTA 100-25 MCG/INH AEPB Inhale 2 puffs into the lungs daily.     . busPIRone (BUSPAR) 5 MG tablet Take  1 tablet (5 mg total) by mouth 3 (three) times daily as needed (for anxiety (frequent subjective SOB)).    . divalproex (DEPAKOTE) 250 MG DR tablet Take 250 mg by mouth 2 (two) times daily.    Marland Kitchen escitalopram (LEXAPRO) 10 MG tablet Take 10 mg by mouth daily.    . fluticasone (FLONASE) 50 MCG/ACT nasal spray Place 1 spray into both nostrils daily. (Patient not taking: Reported on 07/26/2019) 16 g 2  . furosemide (LASIX) 40 MG tablet Take 1  tablet (40 mg total) by mouth daily. 30 tablet 0  . gabapentin (NEURONTIN) 100 MG capsule Take 100 mg by mouth 2 (two) times daily.    . Melatonin 5 MG TABS Take 5 mg by mouth at bedtime.    . Multiple Vitamin (MULTIVITAMIN WITH MINERALS) TABS tablet Take 1 tablet by mouth daily.    . polyethylene glycol (MIRALAX / GLYCOLAX) packet Take 17 g by mouth daily. 14 each 0  . senna-docusate (SENOKOT-S) 8.6-50 MG tablet Take 1 tablet by mouth at bedtime as needed for mild constipation. (Patient not taking: Reported on 07/29/2019) 30 tablet 0     Discharge Medications: Please see discharge summary for a list of discharge medications.  Relevant Imaging Results:  Relevant Lab Results:   Additional Information SSN: [id number]  Oretha Milch, LCSW

## 2019-07-29 NOTE — ED Notes (Addendum)
ED TO INPATIENT HANDOFF REPORT  ED Nurse Name and Phone #: [name] [place] Name/Age/Gender Nicole Barker 85 y.o. female Room/Bed: 021C/021C  Code Status   Code Status: Prior  Home/SNF/Other Skilled nursing facility Patient oriented to: self, place, time and situation Is this baseline? Yes   Triage Complete: Triage complete  Chief Complaint fall  Triage Note Pt BIB [facility] from [facility] for a fall. Per staff, they found her laying on the ground in her room. Pt just discharged from our facility this morning for same.   Allergies Allergies  Allergen Reactions  . Iodine Other (See Comments)    Unknown reaction    Level of Care/Admitting Diagnosis ED Disposition    ED Disposition Condition Comment   Admit  Hospital Area: [facility] [[id number]]  Level of Care: Telemetry Cardiac [[id number]]  Covid Evaluation: Asymptomatic Screening Protocol (No Symptoms)  Diagnosis: CHF (congestive heart failure) [facility]) [id number]  Admitting Physician: SHIRLEY, [place] [id number]  Attending Physician: Zenia Resides [[id number]]  Estimated length of stay: past midnight tomorrow  Certification:: I certify this patient will need inpatient services for at least 2 midnights  PT Class (Do Not Modify): Inpatient [101]  PT Acc Code (Do Not Modify): Private [1]       B Medical/Surgery History Past Medical History:  Diagnosis Date  . Anxiety   . Asthma   . Atrial fibrillation ([place])   . Dementia ([place])   . Hyperlipidemia   . Hypertension    Past Surgical History:  Procedure Laterality Date  . ABDOMINAL HYSTERECTOMY    . BACK SURGERY    . JOINT REPLACEMENT     knee     A IV Location/Drains/Wounds Patient Lines/Drains/Airways Status   Active Line/Drains/Airways    None          Intake/Output Last 24 hours No intake or output data in the 24 hours ending 07/29/19 1405  Labs/Imaging Results for orders placed or performed during the hospital encounter of 07/29/19  (from the past 48 hour(s))  CBC with Differential/Platelet     Status: Abnormal   Collection Time: 07/29/19 10:23 AM  Result Value Ref Range   WBC 15.9 (H) 4.0 - 10.5 K/uL    Comment: WHITE COUNT CONFIRMED ON SMEAR   RBC 4.80 3.87 - 5.11 MIL/uL   Hemoglobin 13.8 12.0 - 15.0 g/dL   HCT 43.9 36.0 - 46.0 %   MCV 91.5 80.0 - 100.0 fL   MCH 28.8 26.0 - 34.0 pg   MCHC 31.4 30.0 - 36.0 g/dL   RDW 18.7 (H) 11.5 - 15.5 %   Platelets  150 - 400 K/uL    PLATELET CLUMPS NOTED ON SMEAR, COUNT APPEARS DECREASED   nRBC 1.1 (H) 0.0 - 0.2 %   Neutrophils Relative % 83 %   Neutro Abs 13.1 (H) 1.7 - 7.7 K/uL   Lymphocytes Relative 7 %   Lymphs Abs 1.2 0.7 - 4.0 K/uL   Monocytes Relative 9 %   Monocytes Absolute 1.5 (H) 0.1 - 1.0 K/uL   Eosinophils Relative 0 %   Eosinophils Absolute 0.0 0.0 - 0.5 K/uL   Basophils Relative 0 %   Basophils Absolute 0.0 0.0 - 0.1 K/uL   Immature Granulocytes 1 %   Abs Immature Granulocytes 0.13 (H) 0.00 - 0.07 K/uL    Comment: Performed at [facility], [address] [place],  [address]  Comprehensive metabolic panel     Status: Abnormal   Collection Time: 07/29/19  10:23 AM  Result Value Ref Range   Sodium 138 135 - 145 mmol/L   Potassium 4.1 3.5 - 5.1 mmol/L   Chloride 97 (L) 98 - 111 mmol/L   CO2 24 22 - 32 mmol/L   Glucose, Bld 104 (H) 70 - 99 mg/dL   BUN 38 (H) 8 - 23 mg/dL   Creatinine, Ser 1.62 (H) 0.44 - 1.00 mg/dL   Calcium 9.2 8.9 - 10.3 mg/dL   Total Protein 5.4 (L) 6.5 - 8.1 g/dL   Albumin 2.9 (L) 3.5 - 5.0 g/dL   AST 62 (H) 15 - 41 U/L   ALT 39 0 - 44 U/L   Alkaline Phosphatase 106 38 - 126 U/L   Total Bilirubin 3.0 (H) 0.3 - 1.2 mg/dL   GFR calc non Af Amer 28 (L) >60 mL/min   GFR calc Af Amer 33 (L) >60 mL/min   Anion gap 17 (H) 5 - 15    Comment: Performed at [facility], [place] [address] [place], [place] [address]  Brain natriuretic peptide     Status: Abnormal   Collection Time: 07/29/19 10:23 AM  Result Value Ref  Range   B Natriuretic Peptide 1,882.7 (H) 0.0 - 100.0 pg/mL    Comment: Performed at [facility] [address] [place], [place] [address]   Dg Thoracic Spine W/swimmers  Result Date: 07/29/2019 CLINICAL DATA:  Fall at nursing home, unwitnessed. EXAM: THORACIC SPINE - 3 VIEWS COMPARISON:  None. FINDINGS: Thoracic vertebral body heights are preserved, with no fracture or subluxation. Moderate flowing osteophytes throughout the thoracic spine. No suspicious focal osseous lesions. Aortic atherosclerosis. Surgical clips are seen in the right upper quadrant of the abdomen. IMPRESSION: No thoracic spine fracture or subluxation. Moderate thoracic spondylosis. Electronically Signed   By: Ilona Sorrel M.D.   On: 07/29/2019 06:24   Dg Lumbar Spine Complete  Result Date: 07/29/2019 CLINICAL DATA:  Unwitnessed fall. EXAM: LUMBAR SPINE - COMPLETE 4+ VIEW COMPARISON:  None. FINDINGS: This report assumes 5 non rib-bearing lumbar vertebrae. There is an acute anterior inferior right L4 vertebral corner fracture. No additional fractures. Severe multilevel degenerative disc disease in the lumbar spine. There is 7 mm anterolisthesis L3-4. Mild bilateral lower lumbar facet arthropathy. No aggressive appearing focal osseous lesions. Abdominal aortic atherosclerosis. IMPRESSION: 1. Acute anterior inferior L4 vertebral corner fracture. 2. Anterolisthesis at L3-4 measuring 7 mm. 3. Severe multilevel lumbar degenerative disc disease. Electronically Signed   By: Ilona Sorrel M.D.   On: 07/29/2019 06:29   Ct Head Wo Contrast  Result Date: 07/29/2019 CLINICAL DATA:  Fall while on anti coagulation therapy EXAM: CT HEAD WITHOUT CONTRAST CT CERVICAL SPINE WITHOUT CONTRAST TECHNIQUE: Multidetector CT imaging of the head and cervical spine was performed following the standard protocol without intravenous contrast. Multiplanar CT image reconstructions of the cervical spine were also generated. COMPARISON:  Head CT 07/26/2019 and  cervical spine CT 07/26/2019 FINDINGS: CT HEAD FINDINGS Brain: There is no mass, hemorrhage or extra-axial collection. There is generalized atrophy without lobar predilection. There is hypoattenuation of the periventricular white matter, most commonly indicating chronic ischemic microangiopathy. Vascular: Atherosclerotic calcification of the internal carotid arteries at the skull base. No abnormal hyperdensity of the major intracranial arteries or dural venous sinuses. Skull: The visualized skull base, calvarium and extracranial soft tissues are normal. Sinuses/Orbits: No fluid levels or advanced mucosal thickening of the visualized paranasal sinuses. No mastoid or middle ear effusion. The orbits are normal. CT CERVICAL SPINE  FINDINGS Alignment: No static subluxation. Facets are aligned. Occipital condyles are normally positioned. Skull base and vertebrae: No acute fracture. Soft tissues and spinal canal: No prevertebral fluid or swelling. No visible canal hematoma. Disc levels: No advanced spinal canal or neural foraminal stenosis. Multilevel degenerative changes. Upper chest: Bilateral pleural effusions. Other: Calcific aortic atherosclerosis. IMPRESSION: 1. No acute intracranial abnormality. Atrophy, chronic ischemic microangiopathy and intracranial atherosclerosis. 2. No fracture or static subluxation of the cervical spine. Electronically Signed   By: Ulyses Jarred M.D.   On: 07/29/2019 06:16   Ct Cervical Spine Wo Contrast  Result Date: 07/29/2019 CLINICAL DATA:  Fall while on anti coagulation therapy EXAM: CT HEAD WITHOUT CONTRAST CT CERVICAL SPINE WITHOUT CONTRAST TECHNIQUE: Multidetector CT imaging of the head and cervical spine was performed following the standard protocol without intravenous contrast. Multiplanar CT image reconstructions of the cervical spine were also generated. COMPARISON:  Head CT 07/26/2019 and cervical spine CT 07/26/2019 FINDINGS: CT HEAD FINDINGS Brain: There is no mass,  hemorrhage or extra-axial collection. There is generalized atrophy without lobar predilection. There is hypoattenuation of the periventricular white matter, most commonly indicating chronic ischemic microangiopathy. Vascular: Atherosclerotic calcification of the internal carotid arteries at the skull base. No abnormal hyperdensity of the major intracranial arteries or dural venous sinuses. Skull: The visualized skull base, calvarium and extracranial soft tissues are normal. Sinuses/Orbits: No fluid levels or advanced mucosal thickening of the visualized paranasal sinuses. No mastoid or middle ear effusion. The orbits are normal. CT CERVICAL SPINE FINDINGS Alignment: No static subluxation. Facets are aligned. Occipital condyles are normally positioned. Skull base and vertebrae: No acute fracture. Soft tissues and spinal canal: No prevertebral fluid or swelling. No visible canal hematoma. Disc levels: No advanced spinal canal or neural foraminal stenosis. Multilevel degenerative changes. Upper chest: Bilateral pleural effusions. Other: Calcific aortic atherosclerosis. IMPRESSION: 1. No acute intracranial abnormality. Atrophy, chronic ischemic microangiopathy and intracranial atherosclerosis. 2. No fracture or static subluxation of the cervical spine. Electronically Signed   By: Ulyses Jarred M.D.   On: 07/29/2019 06:16   Dg Chest Portable 1 View  Result Date: 07/29/2019 CLINICAL DATA:  Hypoxia EXAM: PORTABLE CHEST 1 VIEW COMPARISON:  07/07/2019 and prior exam FINDINGS: Cardiomegaly and mild pulmonary vascular congestion noted. Mild bibasilar atelectasis versus airspace disease noted. There may be very small bilateral pleural effusions present. No pneumothorax noted. No acute bony abnormalities are identified. IMPRESSION: Cardiomegaly, mild pulmonary vascular congestion with mild bibasilar atelectasis/airspace disease. Question very small bilateral pleural effusions. Electronically Signed   By: Margarette Canada M.D.    On: 07/29/2019 11:36   Dg Shoulder Left  Result Date: 07/29/2019 CLINICAL DATA:  Unwitnessed fall. EXAM: LEFT SHOULDER - 2+ VIEW COMPARISON:  None. FINDINGS: High-riding left humeral head. No fracture. No suspicious focal osseous lesions. No evidence of left acromioclavicular separation. No left glenohumeral dislocation. No radiopaque foreign bodies. Moderate left acromioclavicular osteoarthritis. Moderate left glenohumeral joint osteoarthritis. IMPRESSION: No fracture or dislocation in the left shoulder. High-riding left humeral head suggests complete rotator cuff tear or CPPD arthropathy. Moderate left acromioclavicular and glenohumeral joint osteoarthritis. Electronically Signed   By: Ilona Sorrel M.D.   On: 07/29/2019 06:26    Pending Labs Unresulted Labs (From admission, onward)    Start     Ordered   07/29/19 1304  SARS Coronavirus 2 [facility] order, Performed in [facility] hospital lab) Nasopharyngeal Nasopharyngeal Swab  (Symptomatic/High Risk of Exposure/Tier 1 Patients Labs with Precautions)  Once,   STAT  Question Answer Comment  Is this test for diagnosis or screening Diagnosis of ill patient   Symptomatic for COVID-19 as defined by CDC No   Hospitalized for COVID-19 No   Admitted to ICU for COVID-19 No   Previously tested for COVID-19 Yes   Resident in a congregate (group) care setting Yes   Employed in healthcare setting No   Pregnant No      07/29/19 1303          Vitals/Pain Today's Vitals   07/29/19 1001 07/29/19 1002 07/29/19 1005 07/29/19 1030  BP:   107/85 (!) 118/91  Pulse:   (!) 120 (!) 135  Resp:   17 (!) 28  Temp:   97.8 F (36.6 C)   TempSrc:   Oral   SpO2:   92% (!) 89%  Weight:  71.9 kg    Height:  4\' 11"  (1.499 m)    PainSc: 0-No pain       Isolation Precautions Airborne and Contact precautions  Medications Medications  furosemide (LASIX) injection 40 mg (has no administration in time range)  metoprolol tartrate (LOPRESSOR) injection 2.5  mg (2.5 mg Intravenous Given 07/29/19 1028)    Mobility manual wheelchair High fall risk   Focused Assessments    R Recommendations: See Admitting Provider Note  Report given to: Sevier RN  Additional Notes:

## 2019-07-29 NOTE — ED Triage Notes (Signed)
Pt BIB [facility] from [facility] for a fall. Per staff, they found her laying on the ground in her room. Pt just discharged from our facility this morning for same.

## 2019-07-29 NOTE — Evaluation (Signed)
Physical Therapy Evaluation Patient Details Name: Tabrina Misiewicz MRN: [id number] DOB: 1930/12/06 Today's Date: 07/29/2019   History of Present Illness  Patient is an 85 y/o female with PMH of heart failure, renal insufficiency, hypertension, hyperlipidemia, paroxysmal atrial fibrillation and dementia.  She presents s/p fall at her facility (memory care @ [facility]) with several other falls in the past week one with injury with imaging of L spine positive for Acute anterior inferior L4 vertebral corner fracture.  She was found have CHF exacerbation with elevated BNP and hypoxia requiring O2.  Clinical Impression  Patient presents with decreased independence with mobility due to decreased LE strength, back pain, decreased activity tolerance, decreased balance and generalized weakness.  She currently needs max A for coming up to EOB and is unable to stand without +2 A.  Feel she will need SNF level rehab upon d/c.  PT to follow acutely.     Follow Up Recommendations SNF;Supervision/Assistance - 24 hour    Equipment Recommendations  None recommended by PT    Recommendations for Other Services       Precautions / Restrictions Precautions Precautions: Fall;Back Precaution Comments: daughter reports got a brace during ED visit after last fall, but pt rips it off      Mobility  Bed Mobility Overal bed mobility: Needs Assistance Bed Mobility: Rolling;Sidelying to Sit;Sit to Supine Rolling: Mod assist Sidelying to sit: Max assist   Sit to supine: Max assist   General bed mobility comments: cues and assist to flex knees and reach to roll with pt able to hold rail, then assist for legs off bed and to lift trunk, then assisted legs into bed and scooted pt up in bed.  Transfers Overall transfer level: Needs assistance Equipment used: Rolling walker (2 wheeled) Transfers: Sit to/from Stand Sit to Stand: Max assist         General transfer comment: pt unable to stand after two  trials with max A.  Ambulation/Gait                Stairs            Wheelchair Mobility    Modified Rankin (Stroke Patients Only)       Balance Overall balance assessment: Needs assistance Sitting-balance support: Feet supported Sitting balance-Leahy Scale: Poor Sitting balance - Comments: leaning posteriorly initially with mod A for balance, improved during attempted standing x 2 and able to sit with minguard A                                     Pertinent Vitals/Pain Pain Assessment: Faces Faces Pain Scale: Hurts whole lot Pain Location: lower back with mobility Pain Descriptors / Indicators: Grimacing;Discomfort Pain Intervention(s): Monitored during session;Repositioned;Limited activity within patient's tolerance    Home Living Family/patient expects to be discharged to:: Assisted living               Home Equipment: Walker - 4 wheels;Grab bars - tub/shower;Shower seat - built in;Grab bars - toilet Additional Comments: from [employer]    Prior Function           Comments: usually indep with rollator, but recent falls, decreased appetitie, etc per daughter     Hand Dominance        Extremity/Trunk Assessment   Upper Extremity Assessment Upper Extremity Assessment: Defer to OT evaluation    Lower Extremity Assessment Lower Extremity Assessment: LLE deficits/detail;RLE deficits/detail  RLE Deficits / Details: AAROM grossly WFL, but pt not moving legs on her own and not cooperating with strength assessment LLE Deficits / Details: AAROM grossly WFL, but pt not moving legs on her own and not cooperating with strength assessment    Cervical / Trunk Assessment Cervical / Trunk Assessment: Kyphotic  Communication   Communication: No difficulties  Cognition Arousal/Alertness: Lethargic Behavior During Therapy: Flat affect Overall Cognitive Status: History of cognitive impairments - at baseline                                         General Comments General comments (skin integrity, edema, etc.): daughter in room and reports mother usually walks all over the place in ALF with rollator    Exercises     Assessment/Plan    PT Assessment Patient needs continued PT services  PT Problem List Decreased strength;Decreased activity tolerance;Decreased mobility;Pain;Decreased balance;Decreased knowledge of use of DME;Decreased safety awareness       PT Treatment Interventions DME instruction;Gait training;Functional mobility training;Therapeutic exercise;Patient/family education;Balance training;Therapeutic activities    PT Goals (Current goals can be found in the Care Plan section)  Acute Rehab PT Goals Patient Stated Goal: to get stronger PT Goal Formulation: With patient/family Time For Goal Achievement: 08/12/19 Potential to Achieve Goals: Fair    Frequency Min 2X/week   Barriers to discharge        Co-evaluation               AM-PAC PT "6 Clicks" Mobility  Outcome Measure Help needed turning from your back to your side while in a flat bed without using bedrails?: A Lot Help needed moving from lying on your back to sitting on the side of a flat bed without using bedrails?: Total Help needed moving to and from a bed to a chair (including a wheelchair)?: Total Help needed standing up from a chair using your arms (e.g., wheelchair or bedside chair)?: Total Help needed to walk in hospital room?: Total Help needed climbing 3-5 steps with a railing? : Total 6 Click Score: 7    End of Session Equipment Utilized During Treatment: Gait belt Activity Tolerance: Patient limited by lethargy;Patient limited by pain Patient left: in bed;with call bell/phone within reach;with family/visitor present Nurse Communication: Mobility status PT Visit Diagnosis: Muscle weakness (generalized) (M62.81);Other abnormalities of gait and mobility (R26.89);History of falling (Z91.81)    Time:  [id number] PT Time Calculation (min) (ACUTE ONLY): 19 min   Charges:   PT Evaluation $PT Eval High Complexity: 1 High          Magda Kiel, PT Acute Rehabilitation Services [phone] 07/29/2019   Reginia Naas 07/29/2019, 3:45 PM

## 2019-07-29 NOTE — Discharge Instructions (Addendum)
You have had three falls in the last week. Falls are very dangerous in someone who is on a blood thinner. Please discuss with your doctor the relative risks and benefits of continuing to take the blood thinner,

## 2019-07-30 DIAGNOSIS — I5023 Acute on chronic systolic (congestive) heart failure: Secondary | ICD-10-CM

## 2019-07-30 DIAGNOSIS — R296 Repeated falls: Secondary | ICD-10-CM

## 2019-07-30 DIAGNOSIS — I4891 Unspecified atrial fibrillation: Secondary | ICD-10-CM

## 2019-07-30 DIAGNOSIS — Z515 Encounter for palliative care: Secondary | ICD-10-CM

## 2019-07-30 DIAGNOSIS — I5043 Acute on chronic combined systolic (congestive) and diastolic (congestive) heart failure: Secondary | ICD-10-CM

## 2019-07-30 LAB — BASIC METABOLIC PANEL
Anion gap: 16 — ABNORMAL HIGH (ref 5–15)
BUN: 47 mg/dL — ABNORMAL HIGH (ref 8–23)
CO2: 22 mmol/L (ref 22–32)
Calcium: 8.6 mg/dL — ABNORMAL LOW (ref 8.9–10.3)
Chloride: 101 mmol/L (ref 98–111)
Creatinine, Ser: 1.47 mg/dL — ABNORMAL HIGH (ref 0.44–1.00)
GFR calc Af Amer: 37 mL/min — ABNORMAL LOW (ref >60)
GFR calc non Af Amer: 32 mL/min — ABNORMAL LOW (ref >60)
Glucose, Bld: 89 mg/dL (ref 70–99)
Potassium: 4.8 mmol/L (ref 3.5–5.1)
Sodium: 139 mmol/L (ref 135–145)

## 2019-07-30 LAB — CBC WITH DIFFERENTIAL/PLATELET
Abs Immature Granulocytes: 0.07 10*3/uL (ref 0.00–0.07)
Basophils Absolute: 0 10*3/uL (ref 0.0–0.1)
Basophils Relative: 0 %
Eosinophils Absolute: 0.1 10*3/uL (ref 0.0–0.5)
Eosinophils Relative: 0 %
HCT: 42.5 % (ref 36.0–46.0)
Hemoglobin: 13.6 g/dL (ref 12.0–15.0)
Immature Granulocytes: 1 %
Lymphocytes Relative: 11 %
Lymphs Abs: 1.3 10*3/uL (ref 0.7–4.0)
MCH: 28.9 pg (ref 26.0–34.0)
MCHC: 32 g/dL (ref 30.0–36.0)
MCV: 90.2 fL (ref 80.0–100.0)
Monocytes Absolute: 1.2 10*3/uL — ABNORMAL HIGH (ref 0.1–1.0)
Monocytes Relative: 11 %
Neutro Abs: 9 10*3/uL — ABNORMAL HIGH (ref 1.7–7.7)
Neutrophils Relative %: 77 %
Platelets: 88 10*3/uL — ABNORMAL LOW (ref 150–400)
RBC: 4.71 MIL/uL (ref 3.87–5.11)
RDW: 18.9 % — ABNORMAL HIGH (ref 11.5–15.5)
WBC: 11.6 10*3/uL — ABNORMAL HIGH (ref 4.0–10.5)
nRBC: 1.7 % — ABNORMAL HIGH (ref 0.0–0.2)

## 2019-07-30 MED ORDER — AMIODARONE HCL IN DEXTROSE 360-4.14 MG/200ML-% IV SOLN
30.0000 mg/h | INTRAVENOUS | Status: DC
  Administered 2019-07-30 – 2019-08-01 (×4): 30 mg/h via INTRAVENOUS
  Filled 2019-07-30 (×5): qty 200

## 2019-07-30 MED ORDER — AMIODARONE HCL IN DEXTROSE 360-4.14 MG/200ML-% IV SOLN
60.0000 mg/h | INTRAVENOUS | Status: DC
  Administered 2019-07-30 (×2): 60 mg/h via INTRAVENOUS
  Filled 2019-07-30: qty 200

## 2019-07-30 MED ORDER — METOPROLOL TARTRATE 5 MG/5ML IV SOLN
2.5000 mg | Freq: Once | INTRAVENOUS | Status: AC
  Administered 2019-07-30: 2.5 mg via INTRAVENOUS
  Filled 2019-07-30: qty 5

## 2019-07-30 MED ORDER — AMIODARONE LOAD VIA INFUSION
150.0000 mg | Freq: Once | INTRAVENOUS | Status: AC
  Administered 2019-07-30: 150 mg via INTRAVENOUS
  Filled 2019-07-30: qty 83.34

## 2019-07-30 MED ORDER — FUROSEMIDE 10 MG/ML IJ SOLN
60.0000 mg | Freq: Once | INTRAMUSCULAR | Status: AC
  Administered 2019-07-30: 60 mg via INTRAVENOUS
  Filled 2019-07-30: qty 6

## 2019-07-30 NOTE — Evaluation (Signed)
Clinical/Bedside Swallow Evaluation Patient Details  Name: Nicole Barker MRN: [id number] Date of Birth: 13-Aug-1930  Today's Date: 07/30/2019 Time: SLP Start Time (ACUTE ONLY): 0845 SLP Stop Time (ACUTE ONLY): 0902 SLP Time Calculation (min) (ACUTE ONLY): 17 min  Past Medical History:  Past Medical History:  Diagnosis Date  . Anxiety   . Asthma   . Atrial fibrillation ([place])   . Dementia ([place])   . Hyperlipidemia   . Hypertension    Past Surgical History:  Past Surgical History:  Procedure Laterality Date  . ABDOMINAL HYSTERECTOMY    . BACK SURGERY    . JOINT REPLACEMENT     knee   HPI:  Patient is an 85 y/o female with PMH of heart failure, renal insufficiency, hypertension, hyperlipidemia, paroxysmal atrial fibrillation and dementia.  She presents s/p fall at her facility (memory care @ [facility]) with several other falls in the past week one with injury with imaging of L spine positive for Acute anterior inferior L4 vertebral corner fracture.  She was found have CHF exacerbation with elevated BNP and hypoxia requiring O2. Patient has been treated for PNA in the last 10 days prior to admission. COVID negative.  Daughter reports difficulty swallowing.    Assessment / Plan / Recommendation Clinical Impression  Patient presents with a functional oropharyngeal swallow with seemingly intact airway protection. Oral transit of regular texture solids delayed with prolonged and slowed mastication but with eventual full oral clearance, likely a result of declining mentation. No overt indication of aspiration observed. Discussed results and recommendations with daugther via phone. No changes in diet or f/u indicated at this time. Aspiration risk remains present given decreased mental status and inability to consistently self feed. Please re-consult as needed.  SLP Visit Diagnosis: Dysphagia, oral phase (R13.11)    Aspiration Risk  Mild aspiration risk    Diet Recommendation Dysphagia  3 (Mech soft);Thin liquid   Liquid Administration via: Cup;Straw Medication Administration: Crushed with puree Supervision: Staff to assist with self feeding;Full supervision/cueing for compensatory strategies Compensations: Slow rate;Small sips/bites Postural Changes: Seated upright at 90 degrees    Other  Recommendations Oral Care Recommendations: Oral care BID   Follow up Recommendations None        Swallow Study   General HPI: Patient is an 85 y/o female with PMH of heart failure, renal insufficiency, hypertension, hyperlipidemia, paroxysmal atrial fibrillation and dementia.  She presents s/p fall at her facility (memory care @ [facility]) with several other falls in the past week one with injury with imaging of L spine positive for Acute anterior inferior L4 vertebral corner fracture.  She was found have CHF exacerbation with elevated BNP and hypoxia requiring O2. Patient has been treated for PNA in the last 10 days prior to admission. COVID negative.  Daughter reports difficulty swallowing.  Type of Study: Bedside Swallow Evaluation Previous Swallow Assessment: none noted Diet Prior to this Study: Dysphagia 3 (soft);Thin liquids Temperature Spikes Noted: No Respiratory Status: Nasal cannula(3 L) History of Recent Intubation: No Behavior/Cognition: Alert;Cooperative;Requires cueing Oral Cavity Assessment: Dry Oral Care Completed by SLP: Recent completion by staff Oral Cavity - Dentition: Missing dentition;Dentures, not available Vision: Functional for self-feeding Self-Feeding Abilities: Needs assist(due to cognitive status) Patient Positioning: Upright in bed Baseline Vocal Quality: Normal Volitional Cough: Cognitively unable to elicit Volitional Swallow: Able to elicit    Oral/Motor/Sensory Function Overall Oral Motor/Sensory Function: Within functional limits   Ice Chips Ice chips: Not tested   Thin Liquid Thin Liquid: Within  functional limits Presentation: Cup;Straw     Nectar Thick Nectar Thick Liquid: Not tested   Honey Thick Honey Thick Liquid: Not tested   Puree Puree: Within functional limits Presentation: Spoon   Solid     Solid: Impaired Oral Phase Impairments: Impaired mastication Oral Phase Functional Implications: Prolonged oral transit     Kaneesha Constantino MA, CCC-SLP   Gurveer Colucci Meryl 07/30/2019,9:07 AM

## 2019-07-30 NOTE — Progress Notes (Signed)
  Amiodarone Drug - Drug Interaction Consult Note  Recommendations: No dose adjustments needed currently, however: - Monitor QTc closely due to risk of QTc prolongation with Amiodarone, Escitalopram, and Breo Ellipta. Patient on the lowest dose of escitalopram. - Amiodarone can increase risk of myopathy with atorvastain. Monitor for muscle pain or weakness.   - Attempt to keep K+ > 4 to prevent cardiotoxicity if hypokalemia occurs. Watch K+ closely when diuresing.  Amiodarone is metabolized by the cytochrome P450 system and therefore has the potential to cause many drug interactions. Amiodarone has an average plasma half-life of 50 days (range 20 to 100 days).   There is potential for drug interactions to occur several weeks or months after stopping treatment and the onset of drug interactions may be slow after initiating amiodarone.   [x]  Statins: Increased risk of myopathy. Simvastatin- restrict dose to 20mg  daily. Other statins: counsel patients to report any muscle pain or weakness immediately.  []  Anticoagulants: Amiodarone can increase anticoagulant effect. Consider warfarin dose reduction. Patients should be monitored closely and the dose of anticoagulant altered accordingly, remembering that amiodarone levels take several weeks to stabilize.  []  Antiepileptics: Amiodarone can increase plasma concentration of phenytoin, the dose should be reduced. Note that small changes in phenytoin dose can result in large changes in levels. Monitor patient and counsel on signs of toxicity.  []  Beta blockers: increased risk of bradycardia, AV block and myocardial depression. Sotalol - avoid concomitant use.  []   Calcium channel blockers (diltiazem and verapamil): increased risk of bradycardia, AV block and myocardial depression.  []   Cyclosporine: Amiodarone increases levels of cyclosporine. Reduced dose of cyclosporine is recommended.  []  Digoxin dose should be halved when amiodarone is  started.  [x]  Diuretics: increased risk of cardiotoxicity if hypokalemia occurs.  []  Oral hypoglycemic agents (glyburide, glipizide, glimepiride): increased risk of hypoglycemia. Patient's glucose levels should be monitored closely when initiating amiodarone therapy.   [x]  Drugs that prolong the QT interval:  Torsades de pointes risk may be increased with concurrent use - avoid if possible.  Monitor QTc, also keep magnesium/potassium WNL if concurrent therapy can't be avoided. Marland Kitchen Antibiotics: e.g. fluoroquinolones, erythromycin. . Antiarrhythmics: e.g. quinidine, procainamide, disopyramide, sotalol. . Antipsychotics: e.g. phenothiazines, haloperidol.  . Lithium, tricyclic antidepressants, and methadone.   Thank You,   Sherren Kerns, PharmD PGY1 Acute Care Pharmacy Resident [phone]

## 2019-07-30 NOTE — Progress Notes (Signed)
Pt still on Afib RVR sustaining HR 120's 140's. Denies complains. Family med was paged.Lopressor is ordered once. Will carry out.

## 2019-07-30 NOTE — Consult Note (Addendum)
Cardiology Consultation:   Patient ID: Nicole Barker MRN: [id number]; DOB: [birth year]  Admit date: 07/29/2019 Date of Consult: 07/30/2019  Primary Care Provider: Merlene Laughter, MD Primary Cardiologist: Kirk Ruths, MD  Primary Electrophysiologist:  None    Patient Profile:   Nicole Barker is a 85 y.o. female with a hx of dementia, PAF, hypertension and hyperlipidemia who is being seen today for the evaluation of dementia, PAF, hypertension and hyperlipidemia at the request of Dr. Andria Barker.  History of Present Illness:   [name] is a 85 year old female with past medical history of dementia, PAF, hypertension and hyperlipidemia.  She was previously on Multaq, this was switched to amiodarone therapy in November.  She was admitted for acute heart failure which is likely due to breakthrough A. fib in November and again in December 2019.  Echocardiogram obtained in November 2019 showed EF 45 to 50%, moderate MR and mild AS, moderate to severe pulmonary hypertension. Her previous diltiazem and metoprolol were stopped in December secondary to bradycardia.  She was seen as outpatient by Nicole Ransom, PA-C in January 2020 at which time she was maintaining sinus rhythm.  She was admitted from 7/17- 7/19 for acute respiratory failure.  She had O2 desaturation down to the 80s.  She was treated for both pneumonia and CHF exacerbation.  Her Eliquis was reduced from 5 mg down to 2.5 mg twice daily.  Her remaining blood pressure medication include losartan and amlodipine were stopped.  Since her discharge, she has been seen in the ED at least twice for fall.  She was readmitted to [facility] 07/29/2019 with acute shortness of breath and bilateral lower extremity edema.  Due to concern of increased falling episodes and increased need for care, palliative care has been consulted.  There is some slurring of speech, however family does not wish to pursue any further work-up at this time.   She was in atrial fibrillation with RVR with heart rate of 130.  Looking back, she was already back in A. fib even during the previous admission in July.  Although last cardiology note in December says she should be discharged on 200 mg daily of amiodarone therapy, however the final discharge summary did not include amiodarone therapy.  Cardiology has been consulted for A. fib with RVR.  Her weight is not accurate.  Her INR is likely not very accurate as well.   Heart Pathway Score:     Past Medical History:  Diagnosis Date   Anxiety    Asthma    Atrial fibrillation (HCC)    Dementia ([place])    Hyperlipidemia    Hypertension     Past Surgical History:  Procedure Laterality Date   ABDOMINAL HYSTERECTOMY     BACK SURGERY     JOINT REPLACEMENT     knee     Home Medications:  Prior to Admission medications   Medication Sig Start Date End Date Taking? Authorizing Provider  acetaminophen (TYLENOL) 325 MG tablet Take 2 tablets (650 mg total) by mouth every 6 (six) hours as needed for mild pain or headache. 11/01/18  Yes Sheikh, Omair Latif, DO  albuterol (PROVENTIL) (2.5 MG/3ML) 0.083% nebulizer solution Take 2.5 mg by nebulization every 8 (eight) hours as needed for wheezing or shortness of breath.   Yes [provider]  apixaban (ELIQUIS) 2.5 MG TABS tablet Take 1 tablet (2.5 mg total) by mouth 2 (two) times daily. 07/09/19  Yes Bland, Scott, DO  atorvastatin (LIPITOR) 10 MG tablet Take  10 mg by mouth at bedtime.   Yes [provider]  BREO ELLIPTA 100-25 MCG/INH AEPB Inhale 2 puffs into the lungs daily.  06/09/19  Yes [provider]  busPIRone (BUSPAR) 5 MG tablet Take 1 tablet (5 mg total) by mouth 3 (three) times daily as needed (for anxiety (frequent subjective SOB)). 07/09/19  Yes Bland, Scott, DO  divalproex (DEPAKOTE) 250 MG DR tablet Take 250 mg by mouth 2 (two) times daily.   Yes [provider]  escitalopram (LEXAPRO) 10 MG tablet Take 10  mg by mouth daily. 06/22/19  Yes [provider]  furosemide (LASIX) 40 MG tablet Take 1 tablet (40 mg total) by mouth daily. 01/03/19 07/28/28 Yes Kilroy, Luke K, PA-C  gabapentin (NEURONTIN) 100 MG capsule Take 100 mg by mouth 2 (two) times daily.   Yes [provider]  Melatonin 5 MG TABS Take 5 mg by mouth at bedtime.   Yes [provider]  Multiple Vitamins-Minerals (CENTRUM SILVER ADULT 50+ PO) Take 1 tablet by mouth daily.   Yes [provider]  polyethylene glycol (MIRALAX / GLYCOLAX) packet Take 17 g by mouth daily. 11/01/18  Yes Sheikh, [place], DO  senna-docusate (SENOKOT-S) 8.6-50 MG tablet Take 1 tablet by mouth at bedtime as needed for mild constipation. 11/01/18  Yes Sheikh, Omair Latif, DO  doxycycline (ADOXA) 100 MG tablet Take 100 mg by mouth 2 (two) times daily. Finished on 07-26-19 07/18/19   [provider]  fluticasone (FLONASE) 50 MCG/ACT nasal spray Place 1 spray into both nostrils daily. Patient not taking: Reported on 07/26/2019 11/02/18   Raiford Noble Latif, DO  levofloxacin (LEVAQUIN) 750 MG tablet Take 750 mg by mouth daily. Started on 07-18-19  Ds 5    [provider]    Inpatient Medications: Scheduled Meds:  apixaban  2.5 mg Oral BID   atorvastatin  10 mg Oral QHS   divalproex  250 mg Oral BID   escitalopram  10 mg Oral Daily   fluticasone furoate-vilanterol  1 puff Inhalation Daily   furosemide  60 mg Intravenous Once   gabapentin  100 mg Oral BID   Melatonin  4.5 mg Oral QHS   polyethylene glycol  17 g Oral Daily   Continuous Infusions:  sodium chloride     PRN Meds: sodium chloride, acetaminophen, albuterol, senna-docusate  Allergies:    Allergies  Allergen Reactions   Iodine Other (See Comments)    Unknown reaction    Social History:   Social History   Socioeconomic History   Marital status: Widowed    Spouse name: Not on file   Number of children: Not on file   Years of  education: Not on file   Highest education level: Not on file  Occupational History   Not on file  Social Needs   Financial resource strain: Not on file   Food insecurity    Worry: Not on file    Inability: Not on file   Transportation needs    Medical: Not on file    Non-medical: Not on file  Tobacco Use   Smoking status: Never Smoker   Smokeless tobacco: Never Used  Substance and Sexual Activity   Alcohol use: Never    Frequency: Never   Drug use: Never   Sexual activity: Not Currently  Lifestyle   Physical activity    Days per week: Not on file    Minutes per session: Not on file   Stress: Not on file  Relationships   Social Herbalist on phone: Not on file    Gets together: Not on file    Attends religious service: Not on file    Active member of club or organization: Not on file    Attends meetings of clubs or organizations: Not on file    Relationship status: Not on file   Intimate partner violence    Fear of current or ex partner: Not on file    Emotionally abused: Not on file    Physically abused: Not on file    Forced sexual activity: Not on file  Other Topics Concern   Not on file  Social History Narrative   Not on file    Family History:    Family History  Problem Relation Age of Onset   Hypertension Mother      ROS:  Please see the history of present illness.   All other ROS reviewed and negative.     Physical Exam/Data:   Vitals:   07/29/19 1714 07/29/19 1950 07/30/19 0004 07/30/19 0509  BP: 99/61 122/84 106/77 102/88  Pulse: (!) 113 95 (!) 109 (!) 118  Resp:  _0 Temp:  98.2 F (36.8 C) 98 F (36.7 C) 97.8 F (36.6 C)  TempSrc:      SpO2:  94%    Weight:    78 kg  Height:        Intake/Output Summary (Last 24 hours) at 07/30/2019 1001 Last data filed at 07/30/2019 0500 Gross per 24 hour  Intake 120 ml  Output 300 ml  Net -180 ml   Last 3 Weights 07/30/2019 07/29/2019 07/29/2019  Weight (lbs) 172 lb  168 lb 158 lb 8.2 oz  Weight (kg) 78.019 kg 76.204 kg 71.9 kg     Body mass index is 34.74 kg/m.  General: Frail, denies any questions.  Alert, orientated to place, but not oriented to year and who is the president. HEENT: normal Lymph: no adenopathy Neck: no JVD Endocrine:  No thryomegaly Vascular: No carotid bruits; FA pulses 2+ bilaterally without bruits  Cardiac: Irregularly irregular; RRR; no murmur  Lungs:  clear to auscultation bilaterally, no wheezing, rhonchi or rales  Abd: soft, nontender, no hepatomegaly  Ext: no edema Musculoskeletal:  No deformities, BUE and BLE strength normal and equal Skin: warm and dry.  Moderate lower extremity edema Neuro:  CNs 2-12 intact, no focal abnormalities noted Psych:  Normal affect   EKG:  The EKG was personally reviewed and demonstrates: Atrial fibrillation with RVR Telemetry:  Telemetry was personally reviewed and demonstrates: Atrial fibrillation with RVR  Relevant CV Studies:  Echo 10/25/2018 LV EF: 45% -   50%  ------------------------------------------------------------------- Indications:      CHF - 428.0.  ------------------------------------------------------------------- History:   PMH:   Dyspnea.  Atrial fibrillation.  Risk factors: Hypertension. Dyslipidemia.  ------------------------------------------------------------------- Study Conclusions  - Left ventricle: The cavity size was normal. Wall thickness was   increased in a pattern of mild LVH. Systolic function was mildly   reduced. The estimated ejection fraction was in the range of 45%   to 50%. Diffuse hypokinesis. Doppler parameters are consistent   with high ventricular filling pressure. - Aortic valve: Valve mobility was restricted. There was mild   stenosis. There was mild regurgitation. - Mitral valve: Calcified annulus. The findings are consistent with   mild stenosis. There was moderate regurgitation. - Left atrium: The atrium was mildly  dilated. - Tricuspid valve: There was moderate  regurgitation. - Pulmonary arteries: Systolic pressure was moderately to severely   increased. PA peak pressure: 65 mm Hg (S). - Pericardium, extracardiac: There was a left pleural effusion.  Impressions:  - Mild global reduction in LV systolic function; mild LVH;   calcified aortic valve with mild AS (mean gradient 15 mmHg) and   mild AI; MAC with mild MS (mean gradient 5 mmHg); moderate MR;   mild LAE; moderate TR; moderate to severe pulmonary hypertension.  Laboratory Data:  High Sensitivity Troponin:   Recent Labs  Lab 07/07/19 1151  TROPONINIHS 12     Cardiac EnzymesNo results for input(s): TROPONINI in the last 168 hours. No results for input(s): TROPIPOC in the last 168 hours.  Chemistry Recent Labs  Lab 07/26/19 0940 07/29/19 1023 07/30/19 0536  NA 135 138 139  K 4.0 4.1 4.8  CL 95* 97* 101  CO2 _0 GLUCOSE 97 104* 89  BUN 29* 38* 47*  CREATININE 1.60* 1.62* 1.47*  CALCIUM 9.2 9.2 8.6*  GFRNONAA 29* 28* 32*  GFRAA 33* 33* 37*  ANIONGAP 13 17* 16*    Recent Labs  Lab 07/26/19 0940 07/29/19 1023  PROT 6.4* 5.4*  ALBUMIN 3.5 2.9*  AST 50* 62*  ALT 37 39  ALKPHOS 116 106  BILITOT 1.8* 3.0*   Hematology Recent Labs  Lab 07/26/19 0940 07/29/19 1023  WBC 16.5* 15.9*  RBC 5.21* 4.80  HGB 14.4 13.8  HCT 47.3* 43.9  MCV 90.8 91.5  MCH 27.6 28.8  MCHC 30.4 31.4  RDW 18.1* 18.7*  PLT 167 PLATELET CLUMPS NOTED ON SMEAR, COUNT APPEARS DECREASED   BNP Recent Labs  Lab 07/29/19 1023  BNP 1,882.7*    DDimer No results for input(s): DDIMER in the last 168 hours.   Radiology/Studies:  Ct Abdomen Pelvis Wo Contrast  Addendum Date: 07/26/2019   ADDENDUM REPORT: 07/26/2019 12:30 ADDENDUM: Findings were discussed with provider Suella Broad via telephone at 12:28 p.m. on 07/26/2019 by Dr. Davina Poke. Electronically Signed   By: Davina Poke M.D.   On: 07/26/2019 12:30   Result Date:  07/26/2019 CLINICAL DATA:  Abdominal pain, multiple falls EXAM: CT ABDOMEN AND PELVIS WITHOUT CONTRAST TECHNIQUE: Multidetector CT imaging of the abdomen and pelvis was performed following the standard protocol without IV contrast. COMPARISON:  None. FINDINGS: Lower chest: Moderate right and small left pleural effusions with associated atelectasis. Hepatobiliary: Liver has an unremarkable noncontrast appearance. No discrete liver lesion. Gallbladder surgically absent. No intrahepatic biliary ductal dilatation. Pancreas: Unremarkable. No pancreatic ductal dilatation or surrounding inflammatory changes. Spleen: Normal in size without focal abnormality. Adrenals/Urinary Tract: Adrenal glands unremarkable. Subcentimeter partially exophytic lesions are identified involving both kidneys, too small to definitively characterize. No renal calculi. No hydronephrosis. Urinary bladder unremarkable. Stomach/Bowel: Stomach is within normal limits. Appendix appears normal. No evidence of bowel wall thickening, distention, or inflammatory changes. Vascular/Lymphatic: Aortoiliac vascular calcifications. No abdominal or pelvic lymphadenopathy. Reproductive: Status post hysterectomy. No adnexal masses. Other: No abdominal wall hernia or abnormality. No abdominopelvic ascites. Musculoskeletal: Extensive flowing syndesmophytes throughout the visualized thoracolumbar spine suggesting DISH. There is a fracture through the L4 vertebral body extending into the L4-5 disc space (series 6, images 94-99). No vertebral body height loss. No bony retropulsion. Advanced multilevel degenerative changes of the thoracolumbar spine with complete disc height loss of L3-4. Grade 1-2 anterolisthesis of L3-4. Degenerative changes of the bilateral hips without fracture or dislocation. Advanced OA at the pubic symphysis. IMPRESSION: 1. Findings of DISH with an  acute appearing fracture through the L4 vertebral body extending to the L4-5 disc space. Correlation  with point tenderness at this level is recommended. 2. Moderate right and small left pleural effusions. 3. Bilateral subcentimeter indeterminate renal lesions, some of which may represent cysts. Consider nonemergent renal ultrasound as clinically warranted. These results Veronnica Hennings be called to the ordering clinician or representative by the Radiologist Assistant, and communication documented in the PACS or zVision Dashboard. Electronically Signed: By: Davina Poke M.D. On: 07/26/2019 12:21   Dg Thoracic Spine W/swimmers  Result Date: 07/29/2019 CLINICAL DATA:  Fall at nursing home, unwitnessed. EXAM: THORACIC SPINE - 3 VIEWS COMPARISON:  None. FINDINGS: Thoracic vertebral body heights are preserved, with no fracture or subluxation. Moderate flowing osteophytes throughout the thoracic spine. No suspicious focal osseous lesions. Aortic atherosclerosis. Surgical clips are seen in the right upper quadrant of the abdomen. IMPRESSION: No thoracic spine fracture or subluxation. Moderate thoracic spondylosis. Electronically Signed   By: Ilona Sorrel M.D.   On: 07/29/2019 06:24   Dg Lumbar Spine Complete  Result Date: 07/29/2019 CLINICAL DATA:  Unwitnessed fall. EXAM: LUMBAR SPINE - COMPLETE 4+ VIEW COMPARISON:  None. FINDINGS: This report assumes 5 non rib-bearing lumbar vertebrae. There is an acute anterior inferior right L4 vertebral corner fracture. No additional fractures. Severe multilevel degenerative disc disease in the lumbar spine. There is 7 mm anterolisthesis L3-4. Mild bilateral lower lumbar facet arthropathy. No aggressive appearing focal osseous lesions. Abdominal aortic atherosclerosis. IMPRESSION: 1. Acute anterior inferior L4 vertebral corner fracture. 2. Anterolisthesis at L3-4 measuring 7 mm. 3. Severe multilevel lumbar degenerative disc disease. Electronically Signed   By: Ilona Sorrel M.D.   On: 07/29/2019 06:29   Ct Head Wo Contrast  Result Date: 07/29/2019 CLINICAL DATA:  Fall while on anti  coagulation therapy EXAM: CT HEAD WITHOUT CONTRAST CT CERVICAL SPINE WITHOUT CONTRAST TECHNIQUE: Multidetector CT imaging of the head and cervical spine was performed following the standard protocol without intravenous contrast. Multiplanar CT image reconstructions of the cervical spine were also generated. COMPARISON:  Head CT 07/26/2019 and cervical spine CT 07/26/2019 FINDINGS: CT HEAD FINDINGS Brain: There is no mass, hemorrhage or extra-axial collection. There is generalized atrophy without lobar predilection. There is hypoattenuation of the periventricular white matter, most commonly indicating chronic ischemic microangiopathy. Vascular: Atherosclerotic calcification of the internal carotid arteries at the skull base. No abnormal hyperdensity of the major intracranial arteries or dural venous sinuses. Skull: The visualized skull base, calvarium and extracranial soft tissues are normal. Sinuses/Orbits: No fluid levels or advanced mucosal thickening of the visualized paranasal sinuses. No mastoid or middle ear effusion. The orbits are normal. CT CERVICAL SPINE FINDINGS Alignment: No static subluxation. Facets are aligned. Occipital condyles are normally positioned. Skull base and vertebrae: No acute fracture. Soft tissues and spinal canal: No prevertebral fluid or swelling. No visible canal hematoma. Disc levels: No advanced spinal canal or neural foraminal stenosis. Multilevel degenerative changes. Upper chest: Bilateral pleural effusions. Other: Calcific aortic atherosclerosis. IMPRESSION: 1. No acute intracranial abnormality. Atrophy, chronic ischemic microangiopathy and intracranial atherosclerosis. 2. No fracture or static subluxation of the cervical spine. Electronically Signed   By: Ulyses Jarred M.D.   On: 07/29/2019 06:16   Ct Head Wo Contrast  Result Date: 07/26/2019 CLINICAL DATA:  Multiple falls. Bilateral leg edema and ataxia. History of dementia. EXAM: CT HEAD WITHOUT CONTRAST CT CERVICAL SPINE  WITHOUT CONTRAST TECHNIQUE: Multidetector CT imaging of the head and cervical spine was performed following the standard protocol without intravenous  contrast. Multiplanar CT image reconstructions of the cervical spine were also generated. COMPARISON:  CT head and cervical spine 07/23/2019. FINDINGS: CT HEAD FINDINGS Brain: There is no evidence of acute intracranial hemorrhage, mass lesion, brain edema or extra-axial fluid collection. There is moderate generalized atrophy with prominence of the ventricles and subarachnoid spaces. Confluent low-density is again noted within the periventricular white matter consistent with chronic small vessel ischemic changes. There is no CT evidence of acute cortical infarction. Vascular: Prominent intracranial vascular calcifications are again noted. No hyperdense vessel identified. Skull: Negative for fracture or focal lesion. Sinuses/Orbits: The visualized paranasal sinuses and mastoid air cells are clear. No orbital abnormalities are seen. Other: None. CT CERVICAL SPINE FINDINGS Alignment: Normal. Skull base and vertebrae: There is no evidence of acute cervical spine fracture or traumatic subluxation. Multilevel spondylosis is again noted. Soft tissues and spinal canal: No prevertebral fluid or swelling. No visible canal hematoma. Disc levels: No large disc herniation is identified. Multilevel spondylosis with disc space narrowing, uncinate spurring and facet hypertrophy again noted, greatest from C4-5 through C6-7. Upper chest: Small right pleural effusion. Other: There is a stable low-density lesion in the right thyroid lobe, measuring 17 mm. This appears similar to chest CTA 11/02/2018. Bilateral carotid atherosclerosis. IMPRESSION: 1. Stable head CT without acute intracranial findings. Stable atrophy and chronic small vessel ischemic changes. 2. No evidence of acute cervical spine fracture, traumatic subluxation or static signs of instability. 3. Multilevel cervical  spondylosis. Electronically Signed   By: Richardean Sale M.D.   On: 07/26/2019 12:04   Ct Cervical Spine Wo Contrast  Result Date: 07/29/2019 CLINICAL DATA:  Fall while on anti coagulation therapy EXAM: CT HEAD WITHOUT CONTRAST CT CERVICAL SPINE WITHOUT CONTRAST TECHNIQUE: Multidetector CT imaging of the head and cervical spine was performed following the standard protocol without intravenous contrast. Multiplanar CT image reconstructions of the cervical spine were also generated. COMPARISON:  Head CT 07/26/2019 and cervical spine CT 07/26/2019 FINDINGS: CT HEAD FINDINGS Brain: There is no mass, hemorrhage or extra-axial collection. There is generalized atrophy without lobar predilection. There is hypoattenuation of the periventricular white matter, most commonly indicating chronic ischemic microangiopathy. Vascular: Atherosclerotic calcification of the internal carotid arteries at the skull base. No abnormal hyperdensity of the major intracranial arteries or dural venous sinuses. Skull: The visualized skull base, calvarium and extracranial soft tissues are normal. Sinuses/Orbits: No fluid levels or advanced mucosal thickening of the visualized paranasal sinuses. No mastoid or middle ear effusion. The orbits are normal. CT CERVICAL SPINE FINDINGS Alignment: No static subluxation. Facets are aligned. Occipital condyles are normally positioned. Skull base and vertebrae: No acute fracture. Soft tissues and spinal canal: No prevertebral fluid or swelling. No visible canal hematoma. Disc levels: No advanced spinal canal or neural foraminal stenosis. Multilevel degenerative changes. Upper chest: Bilateral pleural effusions. Other: Calcific aortic atherosclerosis. IMPRESSION: 1. No acute intracranial abnormality. Atrophy, chronic ischemic microangiopathy and intracranial atherosclerosis. 2. No fracture or static subluxation of the cervical spine. Electronically Signed   By: Ulyses Jarred M.D.   On: 07/29/2019 06:16    Ct Cervical Spine Wo Contrast  Result Date: 07/26/2019 CLINICAL DATA:  Multiple falls. Bilateral leg edema and ataxia. History of dementia. EXAM: CT HEAD WITHOUT CONTRAST CT CERVICAL SPINE WITHOUT CONTRAST TECHNIQUE: Multidetector CT imaging of the head and cervical spine was performed following the standard protocol without intravenous contrast. Multiplanar CT image reconstructions of the cervical spine were also generated. COMPARISON:  CT head and cervical spine 07/23/2019. FINDINGS: CT  HEAD FINDINGS Brain: There is no evidence of acute intracranial hemorrhage, mass lesion, brain edema or extra-axial fluid collection. There is moderate generalized atrophy with prominence of the ventricles and subarachnoid spaces. Confluent low-density is again noted within the periventricular white matter consistent with chronic small vessel ischemic changes. There is no CT evidence of acute cortical infarction. Vascular: Prominent intracranial vascular calcifications are again noted. No hyperdense vessel identified. Skull: Negative for fracture or focal lesion. Sinuses/Orbits: The visualized paranasal sinuses and mastoid air cells are clear. No orbital abnormalities are seen. Other: None. CT CERVICAL SPINE FINDINGS Alignment: Normal. Skull base and vertebrae: There is no evidence of acute cervical spine fracture or traumatic subluxation. Multilevel spondylosis is again noted. Soft tissues and spinal canal: No prevertebral fluid or swelling. No visible canal hematoma. Disc levels: No large disc herniation is identified. Multilevel spondylosis with disc space narrowing, uncinate spurring and facet hypertrophy again noted, greatest from C4-5 through C6-7. Upper chest: Small right pleural effusion. Other: There is a stable low-density lesion in the right thyroid lobe, measuring 17 mm. This appears similar to chest CTA 11/02/2018. Bilateral carotid atherosclerosis. IMPRESSION: 1. Stable head CT without acute intracranial  findings. Stable atrophy and chronic small vessel ischemic changes. 2. No evidence of acute cervical spine fracture, traumatic subluxation or static signs of instability. 3. Multilevel cervical spondylosis. Electronically Signed   By: Richardean Sale M.D.   On: 07/26/2019 12:04   Dg Chest Portable 1 View  Result Date: 07/29/2019 CLINICAL DATA:  Hypoxia EXAM: PORTABLE CHEST 1 VIEW COMPARISON:  07/07/2019 and prior exam FINDINGS: Cardiomegaly and mild pulmonary vascular congestion noted. Mild bibasilar atelectasis versus airspace disease noted. There may be very small bilateral pleural effusions present. No pneumothorax noted. No acute bony abnormalities are identified. IMPRESSION: Cardiomegaly, mild pulmonary vascular congestion with mild bibasilar atelectasis/airspace disease. Question very small bilateral pleural effusions. Electronically Signed   By: Margarette Canada M.D.   On: 07/29/2019 11:36   Dg Shoulder Left  Result Date: 07/29/2019 CLINICAL DATA:  Unwitnessed fall. EXAM: LEFT SHOULDER - 2+ VIEW COMPARISON:  None. FINDINGS: High-riding left humeral head. No fracture. No suspicious focal osseous lesions. No evidence of left acromioclavicular separation. No left glenohumeral dislocation. No radiopaque foreign bodies. Moderate left acromioclavicular osteoarthritis. Moderate left glenohumeral joint osteoarthritis. IMPRESSION: No fracture or dislocation in the left shoulder. High-riding left humeral head suggests complete rotator cuff tear or CPPD arthropathy. Moderate left acromioclavicular and glenohumeral joint osteoarthritis. Electronically Signed   By: Ilona Sorrel M.D.   On: 07/29/2019 06:26   US Abdomen Limited Ruq  Result Date: 07/29/2019 CLINICAL DATA:  Elevated bilirubin EXAM: ULTRASOUND ABDOMEN LIMITED RIGHT UPPER QUADRANT COMPARISON:  July 26, 2019 CT FINDINGS: Gallbladder: The gallbladder was not visualized and is likely surgically absent as seen on prior CT. Common bile duct: Diameter: 2 mm  Liver: No focal lesion identified. Within normal limits in parenchymal echogenicity. Portal vein is patent on color Doppler imaging with normal direction of blood flow towards the liver. Other: There is a right-sided pleural effusion. IMPRESSION: 1. Status post cholecystectomy. 2. No acute sonographic abnormality. 3. Again noted is a right-sided pleural effusion. Electronically Signed   By: Constance Holster M.D.   On: 07/29/2019 17:14    Assessment and Plan:   1. Atrial fibrillation with RVR:  -Her metoprolol and diltiazem was discontinued in December.  She was supposed to be discharged on 200 mg daily of amiodarone therapy, however her amiodarone therapy was discontinued instead for unknown reason.  Her blood pressure is borderline at this point, unable to add any rate control agent.  Discussed the case with Dr. Curt Bears, plan to restart her amiodarone therapy.  -She is right on the cut off between high-dose and the low-dose Eliquis, Sadao Weyer keep her on the low-dose Eliquis given frequent fall  2. Acute on chronic combined systolic and diastolic heart failure: She tend to have acute heart failure secondary to breakthrough atrial fibrillation.  -I think she is only mildly volume overloaded at this time.  Currently on Lasix 60 mg IV.  Patient was previously diuresed on 40 mg twice daily of IV Lasix in December.  I wish to continue on the current diuretic and see how she does.  Her weight and I&O are not accurate.  3. Frequent fall: With recent deconditioning, family is discussing with palliative care.  4. Severe dementia: According to primary team's note, family did not wish to pursue any further work-up from neuro perspective  5. hypertension: Blood pressure borderline.  All blood pressure medication has been recently discontinued in July.  6. Hyperlipidemia: On low-dose Lipitor      For questions or updates, please contact [facility] Please consult [email or web address] for contact info under      Signed, Almyra Deforest, [place]  07/30/2019 10:01 AM  I have seen and examined this patient with Almyra Deforest.  Agree with above, note added to reflect my findings.  On exam, RRR, no murmurs, lungs clear.  Patient admitted to the hospital with heart failure and rapid atrial fibrillation.  It appears based on her prior admissions that atrial fibrillation does contribute to heart failure exacerbations.  Due to that, we Kenzlee Fishburn start her on IV amiodarone for a rhythm control strategy.  Would continue with diuresis.  Agree with palliative consult and recommendations for palliative care versus hospice care.  Mayreli Alden M. Fiona Coto MD 07/30/2019 10:39 AM

## 2019-07-30 NOTE — Progress Notes (Addendum)
Family Medicine Teaching Service Daily Progress Note Intern Pager: [phone]  Patient name: Nicole Barker Medical record number: [id number] Date of birth: Jul 21, 1930 Age: 85 y.o. Gender: female  Primary Care Provider: Merlene Laughter, MD Consultants: CM, SW, Palliative, Pharmacy  Code Status: DNR   Pt Overview and Major Events to Date:  Hospital Day: 2 07/29/2019: admitted for Dyspnea   Assessment and Plan: Nicole Barker is a 85 y.o. female who presented from ALF w/ dyspnea and noted to be less responsive, eating and drinking less.  She was also noted to have 4 falls in the last 7 days.  Per patient's daughter, patient's current facility can no longer take her, where she has been living for about a year and did well until [place].  Currently, daughter is working with case manager to help find a new bed.  She was admitted for new oxygen requirement and fluid overload.  Her past medical history is significant for congestive heart failure, paroxysmal A. fib on chronic anticoagulation, dementia, hypertension, hyperlipidemia, CKD stage III.  She is also recently had a pneumonia that she has been being treated for for the last 10 days.  #Acute hypoxemic respiratory failure #Asthma #COPD On admission, requiring 2 L of oxygen, satted in the high 80s to low 90's overnight. Now on 3 L. CXR with cardiomegaly, vascular congestion and bilateral pleural effusions.  In 2019, echo showed 45 to 50% ejection fraction.  Patient also admitted with A. fib with RVR which is likely also exacerbating dyspnea (plans as below).  COVID negative.  Creatinine elevated to 1.62 on admission, 1.47 today.  Patient with urine output of 300 yesterday after IV Lasix 40 mg.  Will increase diuretics today.  Daughter at bedside today and is agreeable with plan.  Oxygen as needed >90%  IV Lasix 60 mg, monitor UOP  # Paroxysmal A. fib with RVR Patient continues to be in A. fib with RVR.  She has received IV metoprolol  2.5 mg x 2 since admission.  Avoiding diltiazem given HFrEF. HR in 120's -130's and normotensive. Not on any home rate control. Last SR EKG from January 2020. Will consult cardiology for further medication recommendations   Continue tele  Dr. Curt Bears with cardiology consulted   #Dementia Has increased need of care.  4 falls in the last 7 days with no acute abnormalities on imaging.  Patient speech and cognitive function also declining progressively.  Patient acutely started slurring speech 3 days ago but no further CVA work-up at this time per daughter.  Daughter at bedside today, spoke to her about further work-up and goals of care.  Per daughter, she wants to keep patient comfortable and does not want to pursue aggressive medical care.  Continue Depakote  Delirium precautions  Avoid sedation  Palliative consult  CMN CSW consulted  Soft diet with aspiration precautions  PT/OT/SLP  #Leukocytosis White count on admission is 15.9 with ANC 13.1, now down to 11.6 which is reassuring.  Urine culture sent with no growth to date.  Patient was recently treated for pneumonia.  Patient has remained afebrile.  Afebrile, blood cultures and empiric antibiotics  #Hypertension Stable, not on any antihypertensives at home.    Monitor    #Hyperlipidemia  Continue atorvastatin 10, (discontinue??)  #CKD 3 Cr 1.45 this morning which is baseline    Daily BMP   #Chronic pain  Continue home acetominophen and 100 mg gabapentin BID     #FEN/GI:  . Fluids: none . Electrolytes:   .  Nutrition: soft   Access: Purewick (07/29/19) VTE prophylaxis: Chronically Anticoagulated - Apixaban 2.5mg  BID   Disposition: SW consulted for SNF placement    Subjective:  NAEO.   Objective: Temp:  [97.8 F (36.6 C)-98.2 F (36.8 C)] 97.8 F (36.6 C) (08/09 0509) Pulse Rate:  [84-135] 118 (08/09 0509) Cardiac Rhythm: Atrial fibrillation (08/09 0001) Resp:  [17-28] 20 (08/09 0509) BP: (92-122)/(61-91)  102/88 (08/09 0509) SpO2:  [89 %-98 %] 94 % (08/08 1950) Weight:  [71.9 kg-78 kg] 78 kg (08/09 0509) Intake/Output      08/08 0701 - 08/09 0700 08/09 0701 - 08/10 0700   P.O. 120    Total Intake(mL/kg) 120 (1.5)    Urine (mL/kg/hr) 300    Stool 0    Total Output 300    Net -180         Urine Occurrence 1 x    Stool Occurrence 1 x        Physical Exam: General: Elderly female lying in bed. Appears chronically ill.  HEENT: [place]/AT. PERRLA. EOMI.  Cardiovascular: Tachycardic, irregular, trace BLEE Respiratory: Lung sounds clear anteriorly, unable to auscultate posterior bases.  3 L nasal cannula. Abdomen: + BS. NT, ND, soft to palpation.  Extremities: Warm and well perfused. Moving spontaneously.  Integumentary: No obvious rashes, lesions, trauma on general exam. Neuro: Answering yes and no and able to understand questions.   Laboratory: I have personally read and reviewed all labs and imaging studies.  CBC: Recent Labs  Lab 07/26/19 0940 07/29/19 1023  WBC 16.5* 15.9*  NEUTROABS 13.6* 13.1*  HGB 14.4 13.8  HCT 47.3* 43.9  MCV 90.8 91.5  PLT 167 PLATELET CLUMPS NOTED ON SMEAR, COUNT APPEARS DECREASED   CMP: Recent Labs  Lab 07/26/19 0940 07/29/19 1023 07/30/19 0536  NA 135 138 139  K 4.0 4.1 4.8  CL 95* 97* 101  CO2 27 24 22   GLUCOSE 97 104* 89  BUN 29* 38* 47*  CREATININE 1.60* 1.62* 1.47*  CALCIUM 9.2 9.2 8.6*  ALBUMIN 3.5 2.9*  --      Micro: Covid Negative 8/8 Urine Culture   Imaging/Diagnostic Tests: Dg Thoracic Spine W/swimmers Result Date: 07/29/2019 IMPRESSION: No thoracic spine fracture or subluxation. Moderate thoracic spondylosis.   Dg Lumbar Spine Complete Result Date: 07/29/2019 IMPRESSION: 1. Acute anterior inferior L4 vertebral corner fracture. 2. Anterolisthesis at L3-4 measuring 7 mm. 3. Severe multilevel lumbar degenerative disc disease.   Ct Head Wo Contrast Result Date: 07/29/2019 IMPRESSION: 1. No acute intracranial abnormality.  Atrophy, chronic ischemic microangiopathy and intracranial atherosclerosis. 2. No fracture or static subluxation of the cervical spine.   Ct Cervical Spine Wo Contrast Result Date: 07/29/2019 IMPRESSION: 1. No acute intracranial abnormality. Atrophy, chronic ischemic microangiopathy and intracranial atherosclerosis. 2. No fracture or static subluxation of the cervical spine.   Dg Chest Portable 1 View Result Date: 07/29/2019 IMPRESSION: Cardiomegaly, mild pulmonary vascular congestion with mild bibasilar atelectasis/airspace disease. Question very small bilateral pleural effusions.   Dg Shoulder Left Result Date: 07/29/2019 IMPRESSION: No fracture or dislocation in the left shoulder. High-riding left humeral head suggests complete rotator cuff tear or CPPD arthropathy. Moderate left acromioclavicular and glenohumeral joint osteoarthritis.   US Abdomen Limited Ruq Result Date: 07/29/2019 IMPRESSION: 1. Status post cholecystectomy. 2. No acute sonographic abnormality. 3. Again noted is a right-sided pleural effusion.    Wilber Oliphant, MD 07/30/2019, 8:43 AM PGY-2, [place] Intern pager: [phone], text pages welcome

## 2019-07-30 NOTE — Consult Note (Signed)
Palliative Medicine    Name: Nicole Barker Date: 07/30/2019 MRN: [id number]  DOB: 1930/03/15  Patient Care Team: Merlene Laughter, MD as PCP - General (Internal Medicine) Stanford Breed Denice Bors, MD as PCP - Cardiology (Cardiology)    REASON FOR CONSULTATION: Palliative Care consult requested for this 85 y.o. female with multiple medical problems including dementia, A. fib on Eliquis, CM with EF 45-50%, h/o CHF, CKD 3-4, who was admitted to the hospital on 07/29/19 with after a fall. She was found to have respiratory failure from CHF.  Patient has been significantly declining over the past several weeks with recurrent falls.  Patient was referred to palliative care to help address goals.   SOCIAL HISTORY:     reports that she has never smoked. She has never used smokeless tobacco. She reports that she does not drink alcohol or use drugs.   Patient is widowed.  She lived and [place] for 30 years and moved here 2 years ago to be near her daughter.  Patient is a resident [facility].  She has a daughter who lives in [place].  She has a son in [place] and another daughter in [place].  However, her children who live out of state are less involved.  Patient worked as a Primary school teacher.  ADVANCE DIRECTIVES:  On file  CODE STATUS: DNR  PAST MEDICAL HISTORY: Past Medical History:  Diagnosis Date   Anxiety    Asthma    Atrial fibrillation ([place])    Dementia ([place])    Hyperlipidemia    Hypertension     PAST SURGICAL HISTORY:  Past Surgical History:  Procedure Laterality Date   ABDOMINAL HYSTERECTOMY     BACK SURGERY     JOINT REPLACEMENT     knee    HEMATOLOGY/ONCOLOGY HISTORY:  Oncology History   No history exists.    ALLERGIES:  is allergic to iodine.  MEDICATIONS:  Current Facility-Administered Medications  Medication Dose Route Frequency Provider Last Rate Last Dose   0.9 %  sodium chloride infusion  250 mL Intravenous PRN Shirley, [place], DO        acetaminophen (TYLENOL) tablet 650 mg  650 mg Oral Q4H PRN Shirley, [place], DO       albuterol (PROVENTIL) (2.5 MG/3ML) 0.083% nebulizer solution 2.5 mg  2.5 mg Nebulization Q8H PRN Enid Derry, [place], DO       apixaban Arne Cleveland) tablet 2.5 mg  2.5 mg Oral BID Enid Derry, [place], DO   2.5 mg at 07/29/19 2033   atorvastatin (LIPITOR) tablet 10 mg  10 mg Oral QHS Shirley, [place], DO   10 mg at 07/29/19 2034   divalproex (DEPAKOTE) DR tablet 250 mg  250 mg Oral BID Shirley, [place], DO   250 mg at 07/29/19 2034   escitalopram (LEXAPRO) tablet 10 mg  10 mg Oral Daily Enid Derry, [place], DO       fluticasone furoate-vilanterol (BREO ELLIPTA) 100-25 MCG/INH 1 puff  1 puff Inhalation Daily Zenia Resides, MD   1 puff at 07/30/19 0737   gabapentin (NEURONTIN) capsule 100 mg  100 mg Oral BID Shirley, [place], DO   100 mg at 07/29/19 2033   Melatonin TABS 4.5 mg  4.5 mg Oral QHS Enid Derry, [place], DO   4.5 mg at 07/29/19 2036   polyethylene glycol (MIRALAX / GLYCOLAX) packet 17 g  17 g Oral Daily Enid Derry, [place], DO       senna-docusate (Senokot-S) tablet 1 tablet  1 tablet Oral QHS PRN Enid Derry,  [place], DO        VITAL SIGNS: BP 102/88 (BP Location: Right Arm)    Pulse (!) 118    Temp 97.8 F (36.6 C)    Resp 20    Ht 4\' 11"  (1.499 m)    Wt 172 lb (78 kg)    SpO2 94%    BMI 34.74 kg/m  Filed Weights   07/29/19 1002 07/29/19 1500 07/30/19 0509  Weight: 158 lb 8.2 oz (71.9 kg) 168 lb (76.2 kg) 172 lb (78 kg)    Estimated body mass index is 34.74 kg/m as calculated from the following:   Height as of this encounter: 4\' 11"  (1.499 m).   Weight as of this encounter: 172 lb (78 kg).  LABS: CBC:    Component Value Date/Time   WBC 15.9 (H) 07/29/2019 1023   HGB 13.8 07/29/2019 1023   HCT 43.9 07/29/2019 1023   PLT  07/29/2019 1023    PLATELET CLUMPS NOTED ON SMEAR, COUNT APPEARS DECREASED   MCV 91.5 07/29/2019 1023   NEUTROABS 13.1 (H) 07/29/2019 1023   LYMPHSABS 1.2 07/29/2019 1023    MONOABS 1.5 (H) 07/29/2019 1023   EOSABS 0.0 07/29/2019 1023   BASOSABS 0.0 07/29/2019 1023   Comprehensive Metabolic Panel:    Component Value Date/Time   NA 139 07/30/2019 0536   NA 139 01/03/2019 0917   K 4.8 07/30/2019 0536   CL 101 07/30/2019 0536   CO2 22 07/30/2019 0536   BUN 47 (H) 07/30/2019 0536   BUN 11 01/03/2019 0917   CREATININE 1.47 (H) 07/30/2019 0536   GLUCOSE 89 07/30/2019 0536   CALCIUM 8.6 (L) 07/30/2019 0536   AST 62 (H) 07/29/2019 1023   ALT 39 07/29/2019 1023   ALKPHOS 106 07/29/2019 1023   BILITOT 3.0 (H) 07/29/2019 1023   PROT 5.4 (L) 07/29/2019 1023   ALBUMIN 2.9 (L) 07/29/2019 1023    RADIOGRAPHIC STUDIES: Ct Abdomen Pelvis Wo Contrast  Addendum Date: 07/26/2019   ADDENDUM REPORT: 07/26/2019 12:30 ADDENDUM: Findings were discussed with provider Suella Broad via telephone at 12:28 p.m. on 07/26/2019 by Dr. Davina Poke. Electronically Signed   By: Davina Poke M.D.   On: 07/26/2019 12:30   Result Date: 07/26/2019 CLINICAL DATA:  Abdominal pain, multiple falls EXAM: CT ABDOMEN AND PELVIS WITHOUT CONTRAST TECHNIQUE: Multidetector CT imaging of the abdomen and pelvis was performed following the standard protocol without IV contrast. COMPARISON:  None. FINDINGS: Lower chest: Moderate right and small left pleural effusions with associated atelectasis. Hepatobiliary: Liver has an unremarkable noncontrast appearance. No discrete liver lesion. Gallbladder surgically absent. No intrahepatic biliary ductal dilatation. Pancreas: Unremarkable. No pancreatic ductal dilatation or surrounding inflammatory changes. Spleen: Normal in size without focal abnormality. Adrenals/Urinary Tract: Adrenal glands unremarkable. Subcentimeter partially exophytic lesions are identified involving both kidneys, too small to definitively characterize. No renal calculi. No hydronephrosis. Urinary bladder unremarkable. Stomach/Bowel: Stomach is within normal limits. Appendix appears normal.  No evidence of bowel wall thickening, distention, or inflammatory changes. Vascular/Lymphatic: Aortoiliac vascular calcifications. No abdominal or pelvic lymphadenopathy. Reproductive: Status post hysterectomy. No adnexal masses. Other: No abdominal wall hernia or abnormality. No abdominopelvic ascites. Musculoskeletal: Extensive flowing syndesmophytes throughout the visualized thoracolumbar spine suggesting DISH. There is a fracture through the L4 vertebral body extending into the L4-5 disc space (series 6, images 94-99). No vertebral body height loss. No bony retropulsion. Advanced multilevel degenerative changes of the thoracolumbar spine with complete disc height loss of L3-4. Grade 1-2 anterolisthesis of L3-4. Degenerative changes  of the bilateral hips without fracture or dislocation. Advanced OA at the pubic symphysis. IMPRESSION: 1. Findings of DISH with an acute appearing fracture through the L4 vertebral body extending to the L4-5 disc space. Correlation with point tenderness at this level is recommended. 2. Moderate right and small left pleural effusions. 3. Bilateral subcentimeter indeterminate renal lesions, some of which may represent cysts. Consider nonemergent renal ultrasound as clinically warranted. These results will be called to the ordering clinician or representative by the Radiologist Assistant, and communication documented in the PACS or zVision Dashboard. Electronically Signed: By: Davina Poke M.D. On: 07/26/2019 12:21   Dg Thoracic Spine W/swimmers  Result Date: 07/29/2019 CLINICAL DATA:  Fall at nursing home, unwitnessed. EXAM: THORACIC SPINE - 3 VIEWS COMPARISON:  None. FINDINGS: Thoracic vertebral body heights are preserved, with no fracture or subluxation. Moderate flowing osteophytes throughout the thoracic spine. No suspicious focal osseous lesions. Aortic atherosclerosis. Surgical clips are seen in the right upper quadrant of the abdomen. IMPRESSION: No thoracic spine fracture  or subluxation. Moderate thoracic spondylosis. Electronically Signed   By: Ilona Sorrel M.D.   On: 07/29/2019 06:24   Dg Lumbar Spine Complete  Result Date: 07/29/2019 CLINICAL DATA:  Unwitnessed fall. EXAM: LUMBAR SPINE - COMPLETE 4+ VIEW COMPARISON:  None. FINDINGS: This report assumes 5 non rib-bearing lumbar vertebrae. There is an acute anterior inferior right L4 vertebral corner fracture. No additional fractures. Severe multilevel degenerative disc disease in the lumbar spine. There is 7 mm anterolisthesis L3-4. Mild bilateral lower lumbar facet arthropathy. No aggressive appearing focal osseous lesions. Abdominal aortic atherosclerosis. IMPRESSION: 1. Acute anterior inferior L4 vertebral corner fracture. 2. Anterolisthesis at L3-4 measuring 7 mm. 3. Severe multilevel lumbar degenerative disc disease. Electronically Signed   By: Ilona Sorrel M.D.   On: 07/29/2019 06:29   Ct Head Wo Contrast  Result Date: 07/29/2019 CLINICAL DATA:  Fall while on anti coagulation therapy EXAM: CT HEAD WITHOUT CONTRAST CT CERVICAL SPINE WITHOUT CONTRAST TECHNIQUE: Multidetector CT imaging of the head and cervical spine was performed following the standard protocol without intravenous contrast. Multiplanar CT image reconstructions of the cervical spine were also generated. COMPARISON:  Head CT 07/26/2019 and cervical spine CT 07/26/2019 FINDINGS: CT HEAD FINDINGS Brain: There is no mass, hemorrhage or extra-axial collection. There is generalized atrophy without lobar predilection. There is hypoattenuation of the periventricular white matter, most commonly indicating chronic ischemic microangiopathy. Vascular: Atherosclerotic calcification of the internal carotid arteries at the skull base. No abnormal hyperdensity of the major intracranial arteries or dural venous sinuses. Skull: The visualized skull base, calvarium and extracranial soft tissues are normal. Sinuses/Orbits: No fluid levels or advanced mucosal thickening of the  visualized paranasal sinuses. No mastoid or middle ear effusion. The orbits are normal. CT CERVICAL SPINE FINDINGS Alignment: No static subluxation. Facets are aligned. Occipital condyles are normally positioned. Skull base and vertebrae: No acute fracture. Soft tissues and spinal canal: No prevertebral fluid or swelling. No visible canal hematoma. Disc levels: No advanced spinal canal or neural foraminal stenosis. Multilevel degenerative changes. Upper chest: Bilateral pleural effusions. Other: Calcific aortic atherosclerosis. IMPRESSION: 1. No acute intracranial abnormality. Atrophy, chronic ischemic microangiopathy and intracranial atherosclerosis. 2. No fracture or static subluxation of the cervical spine. Electronically Signed   By: Ulyses Jarred M.D.   On: 07/29/2019 06:16   Ct Head Wo Contrast  Result Date: 07/26/2019 CLINICAL DATA:  Multiple falls. Bilateral leg edema and ataxia. History of dementia. EXAM: CT HEAD WITHOUT CONTRAST CT CERVICAL SPINE  WITHOUT CONTRAST TECHNIQUE: Multidetector CT imaging of the head and cervical spine was performed following the standard protocol without intravenous contrast. Multiplanar CT image reconstructions of the cervical spine were also generated. COMPARISON:  CT head and cervical spine 07/23/2019. FINDINGS: CT HEAD FINDINGS Brain: There is no evidence of acute intracranial hemorrhage, mass lesion, brain edema or extra-axial fluid collection. There is moderate generalized atrophy with prominence of the ventricles and subarachnoid spaces. Confluent low-density is again noted within the periventricular white matter consistent with chronic small vessel ischemic changes. There is no CT evidence of acute cortical infarction. Vascular: Prominent intracranial vascular calcifications are again noted. No hyperdense vessel identified. Skull: Negative for fracture or focal lesion. Sinuses/Orbits: The visualized paranasal sinuses and mastoid air cells are clear. No orbital  abnormalities are seen. Other: None. CT CERVICAL SPINE FINDINGS Alignment: Normal. Skull base and vertebrae: There is no evidence of acute cervical spine fracture or traumatic subluxation. Multilevel spondylosis is again noted. Soft tissues and spinal canal: No prevertebral fluid or swelling. No visible canal hematoma. Disc levels: No large disc herniation is identified. Multilevel spondylosis with disc space narrowing, uncinate spurring and facet hypertrophy again noted, greatest from C4-5 through C6-7. Upper chest: Small right pleural effusion. Other: There is a stable low-density lesion in the right thyroid lobe, measuring 17 mm. This appears similar to chest CTA 11/02/2018. Bilateral carotid atherosclerosis. IMPRESSION: 1. Stable head CT without acute intracranial findings. Stable atrophy and chronic small vessel ischemic changes. 2. No evidence of acute cervical spine fracture, traumatic subluxation or static signs of instability. 3. Multilevel cervical spondylosis. Electronically Signed   By: Richardean Sale M.D.   On: 07/26/2019 12:04   Ct Head Wo Contrast  Result Date: 07/23/2019 CLINICAL DATA:  Unwitnessed fall.  Lethargic.  On blood thinners EXAM: CT HEAD WITHOUT CONTRAST CT CERVICAL SPINE WITHOUT CONTRAST TECHNIQUE: Multidetector CT imaging of the head and cervical spine was performed following the standard protocol without intravenous contrast. Multiplanar CT image reconstructions of the cervical spine were also generated. COMPARISON:  None. FINDINGS: CT HEAD FINDINGS Initial images were degraded by patient motion. Exam repeated with improvement. Brain: No acute intracranial hemorrhage. No focal mass lesion. No CT evidence of acute infarction. No midline shift or mass effect. No hydrocephalus. Basilar cisterns are patent. There are periventricular and subcortical white matter hypodensities. Generalized cortical atrophy. Vascular: No hyperdense vessel or unexpected calcification. Skull: Normal.  Negative for fracture or focal lesion. Sinuses/Orbits: Paranasal sinuses and mastoid air cells are clear. Orbits are clear. Other: None. CT CERVICAL SPINE FINDINGS Alignment: Normal alignment of the cervical vertebral bodies. Skull base and vertebrae: Normal craniocervical junction. No loss of vertebral body height or disc height. Normal facet articulation. No evidence of fracture. Soft tissues and spinal canal: No prevertebral soft tissue swelling. No perispinal or epidural hematoma. Disc levels: Multiple levels of endplate spurring no subluxation. Mild disc space narrowing in the lower cervical spine. Upper chest: Clear Other: None IMPRESSION: 1. No intracranial trauma. 2. Chronic atrophy white matter microvascular disease. 3. No cervical spine fracture. 4. Multilevel disc osteophytic disease. Electronically Signed   By: Suzy Bouchard M.D.   On: 07/23/2019 17:05   Ct Cervical Spine Wo Contrast  Result Date: 07/29/2019 CLINICAL DATA:  Fall while on anti coagulation therapy EXAM: CT HEAD WITHOUT CONTRAST CT CERVICAL SPINE WITHOUT CONTRAST TECHNIQUE: Multidetector CT imaging of the head and cervical spine was performed following the standard protocol without intravenous contrast. Multiplanar CT image reconstructions of the cervical spine were  also generated. COMPARISON:  Head CT 07/26/2019 and cervical spine CT 07/26/2019 FINDINGS: CT HEAD FINDINGS Brain: There is no mass, hemorrhage or extra-axial collection. There is generalized atrophy without lobar predilection. There is hypoattenuation of the periventricular white matter, most commonly indicating chronic ischemic microangiopathy. Vascular: Atherosclerotic calcification of the internal carotid arteries at the skull base. No abnormal hyperdensity of the major intracranial arteries or dural venous sinuses. Skull: The visualized skull base, calvarium and extracranial soft tissues are normal. Sinuses/Orbits: No fluid levels or advanced mucosal thickening of the  visualized paranasal sinuses. No mastoid or middle ear effusion. The orbits are normal. CT CERVICAL SPINE FINDINGS Alignment: No static subluxation. Facets are aligned. Occipital condyles are normally positioned. Skull base and vertebrae: No acute fracture. Soft tissues and spinal canal: No prevertebral fluid or swelling. No visible canal hematoma. Disc levels: No advanced spinal canal or neural foraminal stenosis. Multilevel degenerative changes. Upper chest: Bilateral pleural effusions. Other: Calcific aortic atherosclerosis. IMPRESSION: 1. No acute intracranial abnormality. Atrophy, chronic ischemic microangiopathy and intracranial atherosclerosis. 2. No fracture or static subluxation of the cervical spine. Electronically Signed   By: Ulyses Jarred M.D.   On: 07/29/2019 06:16   Ct Cervical Spine Wo Contrast  Result Date: 07/26/2019 CLINICAL DATA:  Multiple falls. Bilateral leg edema and ataxia. History of dementia. EXAM: CT HEAD WITHOUT CONTRAST CT CERVICAL SPINE WITHOUT CONTRAST TECHNIQUE: Multidetector CT imaging of the head and cervical spine was performed following the standard protocol without intravenous contrast. Multiplanar CT image reconstructions of the cervical spine were also generated. COMPARISON:  CT head and cervical spine 07/23/2019. FINDINGS: CT HEAD FINDINGS Brain: There is no evidence of acute intracranial hemorrhage, mass lesion, brain edema or extra-axial fluid collection. There is moderate generalized atrophy with prominence of the ventricles and subarachnoid spaces. Confluent low-density is again noted within the periventricular white matter consistent with chronic small vessel ischemic changes. There is no CT evidence of acute cortical infarction. Vascular: Prominent intracranial vascular calcifications are again noted. No hyperdense vessel identified. Skull: Negative for fracture or focal lesion. Sinuses/Orbits: The visualized paranasal sinuses and mastoid air cells are clear. No  orbital abnormalities are seen. Other: None. CT CERVICAL SPINE FINDINGS Alignment: Normal. Skull base and vertebrae: There is no evidence of acute cervical spine fracture or traumatic subluxation. Multilevel spondylosis is again noted. Soft tissues and spinal canal: No prevertebral fluid or swelling. No visible canal hematoma. Disc levels: No large disc herniation is identified. Multilevel spondylosis with disc space narrowing, uncinate spurring and facet hypertrophy again noted, greatest from C4-5 through C6-7. Upper chest: Small right pleural effusion. Other: There is a stable low-density lesion in the right thyroid lobe, measuring 17 mm. This appears similar to chest CTA 11/02/2018. Bilateral carotid atherosclerosis. IMPRESSION: 1. Stable head CT without acute intracranial findings. Stable atrophy and chronic small vessel ischemic changes. 2. No evidence of acute cervical spine fracture, traumatic subluxation or static signs of instability. 3. Multilevel cervical spondylosis. Electronically Signed   By: Richardean Sale M.D.   On: 07/26/2019 12:04   Ct Cervical Spine Wo Contrast  Result Date: 07/23/2019 CLINICAL DATA:  Unwitnessed fall.  Lethargic.  On blood thinners EXAM: CT HEAD WITHOUT CONTRAST CT CERVICAL SPINE WITHOUT CONTRAST TECHNIQUE: Multidetector CT imaging of the head and cervical spine was performed following the standard protocol without intravenous contrast. Multiplanar CT image reconstructions of the cervical spine were also generated. COMPARISON:  None. FINDINGS: CT HEAD FINDINGS Initial images were degraded by patient motion. Exam repeated with improvement.  Brain: No acute intracranial hemorrhage. No focal mass lesion. No CT evidence of acute infarction. No midline shift or mass effect. No hydrocephalus. Basilar cisterns are patent. There are periventricular and subcortical white matter hypodensities. Generalized cortical atrophy. Vascular: No hyperdense vessel or unexpected calcification.  Skull: Normal. Negative for fracture or focal lesion. Sinuses/Orbits: Paranasal sinuses and mastoid air cells are clear. Orbits are clear. Other: None. CT CERVICAL SPINE FINDINGS Alignment: Normal alignment of the cervical vertebral bodies. Skull base and vertebrae: Normal craniocervical junction. No loss of vertebral body height or disc height. Normal facet articulation. No evidence of fracture. Soft tissues and spinal canal: No prevertebral soft tissue swelling. No perispinal or epidural hematoma. Disc levels: Multiple levels of endplate spurring no subluxation. Mild disc space narrowing in the lower cervical spine. Upper chest: Clear Other: None IMPRESSION: 1. No intracranial trauma. 2. Chronic atrophy white matter microvascular disease. 3. No cervical spine fracture. 4. Multilevel disc osteophytic disease. Electronically Signed   By: Suzy Bouchard M.D.   On: 07/23/2019 17:05   Dg Chest Portable 1 View  Result Date: 07/29/2019 CLINICAL DATA:  Hypoxia EXAM: PORTABLE CHEST 1 VIEW COMPARISON:  07/07/2019 and prior exam FINDINGS: Cardiomegaly and mild pulmonary vascular congestion noted. Mild bibasilar atelectasis versus airspace disease noted. There may be very small bilateral pleural effusions present. No pneumothorax noted. No acute bony abnormalities are identified. IMPRESSION: Cardiomegaly, mild pulmonary vascular congestion with mild bibasilar atelectasis/airspace disease. Question very small bilateral pleural effusions. Electronically Signed   By: Margarette Canada M.D.   On: 07/29/2019 11:36   Dg Chest Port 1 View  Result Date: 07/07/2019 CLINICAL DATA:  Shortness of breath and dry cough for 1 week. EXAM: PORTABLE CHEST 1 VIEW COMPARISON:  Single-view of the chest 12/15/2018 and/07/2018. PA and lateral chest 11/04/2018. CT chest 11/02/2018. FINDINGS: Marked cardiomegaly. Pulmonary vascular congestion without frank edema is identified. Small bilateral pleural effusions are present, greater on the left.  There is left basilar airspace disease. IMPRESSION: Left basilar airspace disease could be due to atelectasis or pneumonia. Cardiomegaly and vascular congestion. Small bilateral pleural effusions, larger on the left. Electronically Signed   By: Inge Rise M.D.   On: 07/07/2019 11:07   Dg Shoulder Left  Result Date: 07/29/2019 CLINICAL DATA:  Unwitnessed fall. EXAM: LEFT SHOULDER - 2+ VIEW COMPARISON:  None. FINDINGS: High-riding left humeral head. No fracture. No suspicious focal osseous lesions. No evidence of left acromioclavicular separation. No left glenohumeral dislocation. No radiopaque foreign bodies. Moderate left acromioclavicular osteoarthritis. Moderate left glenohumeral joint osteoarthritis. IMPRESSION: No fracture or dislocation in the left shoulder. High-riding left humeral head suggests complete rotator cuff tear or CPPD arthropathy. Moderate left acromioclavicular and glenohumeral joint osteoarthritis. Electronically Signed   By: Ilona Sorrel M.D.   On: 07/29/2019 06:26   US Abdomen Limited Ruq  Result Date: 07/29/2019 CLINICAL DATA:  Elevated bilirubin EXAM: ULTRASOUND ABDOMEN LIMITED RIGHT UPPER QUADRANT COMPARISON:  July 26, 2019 CT FINDINGS: Gallbladder: The gallbladder was not visualized and is likely surgically absent as seen on prior CT. Common bile duct: Diameter: 2 mm Liver: No focal lesion identified. Within normal limits in parenchymal echogenicity. Portal vein is patent on color Doppler imaging with normal direction of blood flow towards the liver. Other: There is a right-sided pleural effusion. IMPRESSION: 1. Status post cholecystectomy. 2. No acute sonographic abnormality. 3. Again noted is a right-sided pleural effusion. Electronically Signed   By: Constance Holster M.D.   On: 07/29/2019 17:14    PERFORMANCE STATUS (  ECOG) : 3 - Symptomatic, >50% confined to bed  Review of Systems Unless otherwise noted, a complete review of systems is negative.  Physical  Exam General: NAD, frail appearing, thin Cardiovascular: Irregular Pulmonary: clear ant fields, on O2 Abdomen: soft, nontender, + bowel sounds GU: no suprapubic tenderness Extremities: Bilateral lower extremity edema, no joint deformities Skin: no rashes Neurological: Weakness, confusion, alert, answer simple questions  IMPRESSION: Patient currently resting in bed.  She is pleasantly confused.  She knows she is in the hospital and could tell me her daughter's name.  She is comfortable appearing.  No family at bedside.  I called and spoke with patient's daughter by phone.  Daughter tells me that patient has been significantly declining over the past several weeks at the ALF.  Patient has had multiple falls and has required several trips to the ER.  Daughter says that she anticipates patient may be nearing end-of-life.  She says her primary goal is to ensure the patient is comfortable and would like to avoid aggressive or invasive care going forward.  Daughter was told by the ALF that patient would likely need a higher level of care such as an SNF.  However, daughter plans to explore the option of patient returning to ALF with hospice.  We discussed options such as a do not hospitalize order, as daughter suggests that she would really only want to focus on patient's comfort.  She plans to call the ALF director tomorrow.  Alternatively, daughter would be interested with palliative care versus hospice at [facility].  She understands that hospice would not immediately available at SNF if patient is receiving rehab under skilled Medicare days.  PLAN: -Continue current scope of treatment -CM/SW consults to help with disposition -Daughter exploring options for ALF with hospice versus SNF with hospice versus palliative care -DNR   Time Total: 60 minutes  Visit consisted of counseling and education dealing with the complex and emotionally intense issues of symptom management and palliative care in the setting  of serious and potentially life-threatening illness.Greater than 50%  of this time was spent counseling and coordinating care related to the above assessment and plan.  Signed by: Altha Harm, PhD, NP-C [phone] (Work Cell)

## 2019-07-31 DIAGNOSIS — L8990 Pressure ulcer of unspecified site, unspecified stage: Secondary | ICD-10-CM | POA: Insufficient documentation

## 2019-07-31 DIAGNOSIS — I5023 Acute on chronic systolic (congestive) heart failure: Secondary | ICD-10-CM

## 2019-07-31 DIAGNOSIS — I4891 Unspecified atrial fibrillation: Secondary | ICD-10-CM

## 2019-07-31 LAB — CBC
HCT: 39.5 % (ref 36.0–46.0)
Hemoglobin: 12.4 g/dL (ref 12.0–15.0)
MCH: 28.4 pg (ref 26.0–34.0)
MCHC: 31.4 g/dL (ref 30.0–36.0)
MCV: 90.6 fL (ref 80.0–100.0)
Platelets: 75 10*3/uL — ABNORMAL LOW (ref 150–400)
RBC: 4.36 MIL/uL (ref 3.87–5.11)
RDW: 19 % — ABNORMAL HIGH (ref 11.5–15.5)
WBC: 11.2 10*3/uL — ABNORMAL HIGH (ref 4.0–10.5)
nRBC: 1.3 % — ABNORMAL HIGH (ref 0.0–0.2)

## 2019-07-31 LAB — BASIC METABOLIC PANEL
Anion gap: 14 (ref 5–15)
BUN: 51 mg/dL — ABNORMAL HIGH (ref 8–23)
CO2: 27 mmol/L (ref 22–32)
Calcium: 8.5 mg/dL — ABNORMAL LOW (ref 8.9–10.3)
Chloride: 98 mmol/L (ref 98–111)
Creatinine, Ser: 1.63 mg/dL — ABNORMAL HIGH (ref 0.44–1.00)
GFR calc Af Amer: 32 mL/min — ABNORMAL LOW (ref >60)
GFR calc non Af Amer: 28 mL/min — ABNORMAL LOW (ref >60)
Glucose, Bld: 95 mg/dL (ref 70–99)
Potassium: 3.5 mmol/L (ref 3.5–5.1)
Sodium: 139 mmol/L (ref 135–145)

## 2019-07-31 LAB — URINE CULTURE: Culture: 80000 — AB

## 2019-07-31 LAB — SAVE SMEAR(SSMR), FOR PROVIDER SLIDE REVIEW

## 2019-07-31 MED ORDER — POTASSIUM CHLORIDE CRYS ER 20 MEQ PO TBCR
40.0000 meq | EXTENDED_RELEASE_TABLET | Freq: Once | ORAL | Status: AC
  Administered 2019-07-31: 40 meq via ORAL
  Filled 2019-07-31: qty 2

## 2019-07-31 MED ORDER — SODIUM CHLORIDE 0.9 % IV SOLN
1.0000 g | INTRAVENOUS | Status: AC
  Administered 2019-07-31: 1 g via INTRAVENOUS
  Filled 2019-07-31: qty 10

## 2019-07-31 MED ORDER — ACETAMINOPHEN 325 MG PO TABS
650.0000 mg | ORAL_TABLET | Freq: Four times a day (QID) | ORAL | Status: DC
  Administered 2019-07-31 – 2019-08-03 (×12): 650 mg via ORAL
  Filled 2019-07-31 (×12): qty 2

## 2019-07-31 MED ORDER — FUROSEMIDE 10 MG/ML IJ SOLN
60.0000 mg | Freq: Once | INTRAMUSCULAR | Status: AC
  Administered 2019-07-31: 60 mg via INTRAVENOUS
  Filled 2019-07-31: qty 6

## 2019-07-31 NOTE — Plan of Care (Signed)
  Problem: Clinical Measurements: Goal: Respiratory complications will improve Outcome: Progressing   Problem: Activity: Goal: Risk for activity intolerance will decrease Outcome: Progressing   Problem: Nutrition: Goal: Adequate nutrition will be maintained Outcome: Progressing   

## 2019-07-31 NOTE — TOC Progression Note (Addendum)
Transition of Care [facility]) - Progression Note    Patient Details  Name: Heyley Warden MRN: [id number] Date of Birth: 04/07/1930  Transition of Care [facility]) CM/SW [place], [place] Phone Number: 07/31/2019, 2:26 PM  Clinical Narrative:      Update: patient's daughter has selected [place] is waiting for [facility] to confirm bed offer.   CSW called patient's daughter and provided bed offers. Patient's daughter states she will review and call CSW back with her decision.  [name], MSW, [facility] Clinical Social Worker [phone]    Expected Discharge Plan: Skilled Nursing Facility Barriers to Discharge: Insurance Authorization, Continued Medical Work up  Expected Discharge Plan and Services Expected Discharge Plan: [place] Choice: [place] arrangements for the past 2 months: Assisted Living Facility                                       Social Determinants of Health (SDOH) Interventions    Readmission Risk Interventions No flowsheet data found.

## 2019-07-31 NOTE — Progress Notes (Signed)
Family Medicine Teaching Service Daily Progress Note Intern Pager: [phone]  Patient name: Nicole Barker Medical record number: [id number] Date of birth: 1930/08/11 Age: 85 y.o. Gender: female  Primary Care Provider: Merlene Laughter, MD Consultants: Cards, Palliative  Code Status: DNR   Pt Overview and Major Events to Date:  Hospital Day: 3 07/29/2019: admitted for Dyspnea   Assessment and Plan: Nicole Barker is a 85 y.o. female who presented from ALF w/ dyspnea and noted to be less responsive, eating and drinking less.  Her PMH is significant for HFrEF, paroxysmal A. fib on anticoagulation, dementia, HTN, HLD, CKD, stage III.    #Acute hypoxemic respiratory failure #Acute on chronic HFrEF  #Asthma #COPD Stable.  Requiring 3 L. COVID negative. Creatinine elevated to 1.63 on 07/31/2019.  Patient is breathing comfortably and sitting up in chair.  Not good documented U OP overnight.  Oxygen as needed >90%  IV Lasix 80 mg, increased from 60 on 08/09, monitor UOP  #Paroxysmal A. fib  Patient in A. fib withRVR. S/p metoprolol 2.5 mg x 2 since admission.  Patient was previously to be discharged on 200 mg daily of amiodarone, unclear why this was discontinued.  Amiodarone restarted on 07/30/2019.  Continue tele  Cardiology following, appreciate recommendations  Continue amiodarone gtt per cardiology for another day  #Dementia Has increased need of care.  4 falls in the last 7 days with no acute abnormalities on imaging.  Patient speech and cognitive function also declining progressively. Per daughter, she wants to keep patient comfortable and does not want to pursue aggressive medical care.  Continue Depakote  Delirium precautions, Avoid sedation  Palliative consult  CMN CSW consulted  Soft diet with aspiration precautions  PT/OT/SLP  #Thrombocytopenia Plt 167 on 08/05; 75 on 08/10. Previously with clumping.   Continue to monitor  Save smear  #80,000  colonies E. Coli on urine culture Patient unable to verbalize any symptoms.  WBC elevated on admission to 15.9 but has improved to 11.1 without any antibiotic intervention.  Susceptible to CTX, will give x1 dose given that we are not able to discern if this is truly asymptomatic bacteruria.   Will consider switching to oral medications on 08/01/2019  #Hypertension Stable, not on any antihypertensives at home.    Monitor    #Hyperlipidemia  Continue atorvastatin 10, (discontinue??)  #CKD 3 Cr 1.63 this morning which is baseline   Trend BMP / urinary output Replace electrolytes as indicated Avoid nephrotoxic agents, ensure adequate renal perfusion  #Chronic pain  Continue home acetominophen and 100 mg gabapentin BID     #FEN/GI:  . Fluids: none . Nutrition: soft   Access: Purewick (07/29/19) VTE prophylaxis: Chronically Anticoagulated - Apixaban 2.5mg  BID   Disposition: SW consulted for SNF placement    Subjective:  Daughter not at bedside today for exam.  Patient is only oriented to self.  She is smiling and sitting up in chair when I enter the room and states that she is not in pain anywhere.  Objective: Temp:  [97.8 F (36.6 C)-98.1 F (36.7 C)] 98.1 F (36.7 C) (08/10 0618) Pulse Rate:  [86-115] 90 (08/10 0618) Cardiac Rhythm: Atrial fibrillation (08/10 0700) Resp:  [18-20] 20 (08/10 0618) BP: (92-118)/(64-80) 97/78 (08/10 0618) SpO2:  [90 %-97 %] 91 % (08/10 0805) Weight:  [78.5 kg] 78.5 kg (08/10 0618) Intake/Output      08/09 0701 - 08/10 0700 08/10 0701 - 08/11 0700   P.O. 200    I.V. (mL/kg) 406.6 (5.2)  Total Intake(mL/kg) 606.6 (7.7)    Urine (mL/kg/hr) 400 (0.2)    Stool     Total Output 400    Net +206.6         Stool Occurrence 1 x        Physical Exam: General: NAD, pleasant, ending up in chair on 3L [place] Eyes: PERRL, EOMI, no conjunctival pallor or injection ENTM: Moist mucous membranes Cardiovascular: RRR, no m/r/g, 1+ pitting BL LE  edema Respiratory: CTA BL, normal work of breathing on 3L [place], minimal crackles heard at bases Derm: no rashes appreciated Neuro: CN II-XII grossly intact Psych: AO to self only, appropriate affect   Laboratory: I have personally read and reviewed all labs and imaging studies.  CBC: Recent Labs  Lab 07/26/19 0940 07/29/19 1023 07/30/19 1031 07/31/19 0625  WBC 16.5* 15.9* 11.6* 11.2*  NEUTROABS 13.6* 13.1* 9.0*  --   HGB 14.4 13.8 13.6 12.4  HCT 47.3* 43.9 42.5 39.5  MCV 90.8 91.5 90.2 90.6  PLT 167 PLATELET CLUMPS NOTED ON SMEAR, COUNT APPEARS DECREASED 88* 75*   CMP: Recent Labs  Lab 07/26/19 0940 07/29/19 1023 07/30/19 0536 07/31/19 0625  NA 135 138 139 139  K 4.0 4.1 4.8 3.5  CL 95* 97* 101 98  CO2 27 24 22 27   GLUCOSE 97 104* 89 95  BUN 29* 38* 47* 51*  CREATININE 1.60* 1.62* 1.47* 1.63*  CALCIUM 9.2 9.2 8.6* 8.5*  ALBUMIN 3.5 2.9*  --   --      Micro: Covid Negative 8/8 Urine Culture   Imaging/Diagnostic Tests: Dg Thoracic Spine W/swimmers Result Date: 07/29/2019 IMPRESSION: No thoracic spine fracture or subluxation. Moderate thoracic spondylosis.   Dg Lumbar Spine Complete Result Date: 07/29/2019 IMPRESSION: 1. Acute anterior inferior L4 vertebral corner fracture. 2. Anterolisthesis at L3-4 measuring 7 mm. 3. Severe multilevel lumbar degenerative disc disease.   Ct Head Wo Contrast Result Date: 07/29/2019 IMPRESSION: 1. No acute intracranial abnormality. Atrophy, chronic ischemic microangiopathy and intracranial atherosclerosis. 2. No fracture or static subluxation of the cervical spine.   Ct Cervical Spine Wo Contrast Result Date: 07/29/2019 IMPRESSION: 1. No acute intracranial abnormality. Atrophy, chronic ischemic microangiopathy and intracranial atherosclerosis. 2. No fracture or static subluxation of the cervical spine.   Dg Chest Portable 1 View Result Date: 07/29/2019 IMPRESSION: Cardiomegaly, mild pulmonary vascular congestion with mild bibasilar  atelectasis/airspace disease. Question very small bilateral pleural effusions.   Dg Shoulder Left Result Date: 07/29/2019 IMPRESSION: No fracture or dislocation in the left shoulder. High-riding left humeral head suggests complete rotator cuff tear or CPPD arthropathy. Moderate left acromioclavicular and glenohumeral joint osteoarthritis.   US Abdomen Limited Ruq Result Date: 07/29/2019 IMPRESSION: 1. Status post cholecystectomy. 2. No acute sonographic abnormality. 3. Again noted is a right-sided pleural effusion.    Prentice Sackrider, [place], DO 07/31/2019, 8:39 AM PGY-3, [place] Intern pager: [phone], text pages welcome

## 2019-07-31 NOTE — Progress Notes (Addendum)
Physical Therapy Treatment Patient Details Name: Nicole Barker MRN: [id number] DOB: 1930/01/18 Today's Date: 07/31/2019    History of Present Illness Patient is an 85 y/o female with PMH of heart failure, renal insufficiency, hypertension, hyperlipidemia, paroxysmal atrial fibrillation and dementia.  She presents s/p fall at her facility (memory care @ [facility]) with several other falls in the past week one with injury with imaging of L spine positive for Acute anterior inferior L4 vertebral corner fracture.  She was found have CHF exacerbation with elevated BNP and hypoxia requiring O2.    PT Comments    Pt seen in conjunction with OT. Pt oriented to self but able to demonstrate improved mobility and function with tolerance for limited gait this session and able to utilize chair to brush teeth at sink. Pt requires assist for all mobility with SNF remaining appropriate DC plan. Pt performs better with automatic tasks such as standing when RW present. Encouraged OOB daily with nursing. Will continue to follow  HR 101-124 highly fluctuant throughout session Pt on 3L on arrival with sats reading 87% and increased to 4L but then never able to get pulse ox reading despite 2 separate monitors. Pt not in acute distress.    Follow Up Recommendations  SNF;Supervision/Assistance - 24 hour     Equipment Recommendations  None recommended by PT    Recommendations for Other Services       Precautions / Restrictions Precautions Precautions: Fall    Mobility  Bed Mobility Overal bed mobility: Needs Assistance Bed Mobility: Supine to Sit     Supine to sit: Max assist;+2 for physical assistance     General bed mobility comments: cues for sequence with physical assist to bring legs off of bed and elevate trunk from surface, increased time, mod assist to scoot fully to EOB  Transfers Overall transfer level: Needs assistance   Transfers: Sit to/from Stand Sit to Stand: Min  assist;+2 physical assistance;From elevated surface         General transfer comment: bed height elevated with RW present pt able to rise from surface with min assist with cues for hand placement, mod assist to sit as pt stuck and unable to initiate hip flexion  Ambulation/Gait Ambulation/Gait assistance: Min assist;+2 safety/equipment Gait Distance (Feet): 8 Feet Assistive device: Rolling walker (2 wheeled) Gait Pattern/deviations: Step-through pattern;Decreased stride length;Trunk flexed;Shuffle   Gait velocity interpretation: <1.8 ft/sec, indicate of risk for recurrent falls General Gait Details: cues for posture and safety with close chair follow. pt fatigues quickly on 4L with gait   Stairs             Wheelchair Mobility    Modified Rankin (Stroke Patients Only)       Balance Overall balance assessment: Needs assistance Sitting-balance support: Bilateral upper extremity supported Sitting balance-Leahy Scale: Fair Sitting balance - Comments: pt able to progress from min to minguard EOB with bil UE on surface and feet supported   Standing balance support: Bilateral upper extremity supported Standing balance-Leahy Scale: Poor Standing balance comment: reliant on bil UE support in standing                            Cognition Arousal/Alertness: Awake/alert Behavior During Therapy: Flat affect Overall Cognitive Status: No family/caregiver present to determine baseline cognitive functioning  General Comments: pt oriented to self and able to follow single step commands with increased time      Exercises      General Comments        Pertinent Vitals/Pain Pain Assessment: No/denies pain    Home Living                      Prior Function            PT Goals (current goals can now be found in the care plan section) Progress towards PT goals: Progressing toward goals    Frequency     Min 2X/week      PT Plan Current plan remains appropriate    Co-evaluation PT/OT/SLP Co-Evaluation/Treatment: Yes Reason for Co-Treatment: Complexity of the patient's impairments (multi-system involvement);For patient/therapist safety;Necessary to address cognition/behavior during functional activity PT goals addressed during session: Mobility/safety with mobility;Balance;Proper use of DME        AM-PAC PT "6 Clicks" Mobility   Outcome Measure  Help needed turning from your back to your side while in a flat bed without using bedrails?: Total Help needed moving from lying on your back to sitting on the side of a flat bed without using bedrails?: Total Help needed moving to and from a bed to a chair (including a wheelchair)?: A Lot Help needed standing up from a chair using your arms (e.g., wheelchair or bedside chair)?: A Little Help needed to walk in hospital room?: A Lot Help needed climbing 3-5 steps with a railing? : Total 6 Click Score: 10    End of Session Equipment Utilized During Treatment: Gait belt;Oxygen Activity Tolerance: Patient limited by fatigue Patient left: in chair;with call bell/phone within reach;with chair alarm set Nurse Communication: Mobility status PT Visit Diagnosis: Muscle weakness (generalized) (M62.81);Other abnormalities of gait and mobility (R26.89);History of falling (Z91.81)     Time: [id number] PT Time Calculation (min) (ACUTE ONLY): 28 min  Charges:  $Therapeutic Activity: 8-22 mins                     [place], PT Acute Rehabilitation Services Pager: [phone] Office: [place] 07/31/2019, 9:35 AM

## 2019-07-31 NOTE — Progress Notes (Signed)
Tele unable to analyze the QTc. EKG done(see result).  EKG Interpretation  Date/Time:07/31/19 0340    Ventricular Rate:   106    QRS Duration:  76 QT Interval:   290 QTC Calculation:  385    Text Interpretation:  AFIB with RVR  Pt still on amio at 16.73ml/hr (30mg /hr) Will monitor

## 2019-07-31 NOTE — Evaluation (Signed)
Occupational Therapy Evaluation Patient Details Name: Nicole Barker MRN: [id number] DOB: 1930-11-16 Today's Date: 07/31/2019    History of Present Illness Patient is an 85 y/o female with PMH of heart failure, renal insufficiency, hypertension, hyperlipidemia, paroxysmal atrial fibrillation and dementia.  She presents s/p fall at her facility (memory care @ [facility]) with several other falls in the past week one with injury with imaging of L spine positive for Acute anterior inferior L4 vertebral corner fracture.  She was found have CHF exacerbation with elevated BNP and hypoxia requiring O2.   Clinical Impression   Patient admitted for above and limited by problem list below, including impaired balance, generalized weakness and decreased activity toerlance. She has baseline cognitive deficits, lives in memory care at [facility]. Unable to provide PLOF. Oriented to self only, follows 1 step commands fairly consistently.  Able to complete grooming with min assist seated at sink, UB ADLs with mod-max assist, LB ADLs with max-total assist, and toilet transfers with min assist +2 using RW.  Patient will benefit from continued OT services while admitted in order to decrease burden of care and optimize participation in ADLs. Will follow.     Follow Up Recommendations  SNF;Supervision/Assistance - 24 hour    Equipment Recommendations  3 in 1 bedside commode    Recommendations for Other Services       Precautions / Restrictions Precautions Precautions: Fall Restrictions Weight Bearing Restrictions: No      Mobility Bed Mobility Overal bed mobility: Needs Assistance Bed Mobility: Supine to Sit     Supine to sit: Max assist;+2 for physical assistance     General bed mobility comments: cues for sequence with physical assist to bring legs off of bed and elevate trunk from surface, increased time, mod assist to scoot fully to EOB  Transfers Overall transfer level: Needs  assistance   Transfers: Sit to/from Stand Sit to Stand: Min assist;+2 physical assistance;From elevated surface         General transfer comment: bed height elevated with RW present pt able to rise from surface with min assist with cues for hand placement, mod assist to sit as pt stuck and unable to initiate hip flexion    Balance Overall balance assessment: Needs assistance Sitting-balance support: Bilateral upper extremity supported Sitting balance-Leahy Scale: Fair Sitting balance - Comments: pt able to progress from min to minguard EOB with bil UE on surface and feet supported   Standing balance support: Bilateral upper extremity supported Standing balance-Leahy Scale: Poor Standing balance comment: reliant on bil UE support in standing                           ADL either performed or assessed with clinical judgement   ADL Overall ADL's : Needs assistance/impaired     Grooming: Minimal assistance;Sitting Grooming Details (indicate cue type and reason): able to brush teeth with min assist with min cueing, increased time required  Upper Body Bathing: Minimal assistance;Sitting   Lower Body Bathing: Maximal assistance;Sit to/from stand   Upper Body Dressing : Maximal assistance;Sitting   Lower Body Dressing: +2 for physical assistance;Total assistance;Sit to/from stand   Toilet Transfer: Minimal assistance;+2 for physical assistance;+2 for safety/equipment;Ambulation;RW Toilet Transfer Details (indicate cue type and reason): simulated to recliner          Functional mobility during ADLs: Minimal assistance;+2 for physical assistance;+2 for safety/equipment;Cueing for sequencing;Rolling walker General ADL Comments: pt limited by weakness, impaired balance, and cognition  Vision         Perception     Praxis      Pertinent Vitals/Pain Pain Assessment: No/denies pain     Hand Dominance     Extremity/Trunk Assessment Upper Extremity  Assessment Upper Extremity Assessment: Generalized weakness   Lower Extremity Assessment Lower Extremity Assessment: Defer to PT evaluation   Cervical / Trunk Assessment Cervical / Trunk Assessment: Kyphotic   Communication Communication Communication: No difficulties   Cognition Arousal/Alertness: Awake/alert Behavior During Therapy: Flat affect Overall Cognitive Status: No family/caregiver present to determine baseline cognitive functioning                                 General Comments: pt oriented to self only, able to follow 1 step commands with increased time    General Comments       Exercises     Shoulder Instructions      Home Living Family/patient expects to be discharged to:: Assisted living                             Home Equipment: Walker - 4 wheels;Grab bars - tub/shower;Shower seat - built in;Grab bars - toilet   Additional Comments: from [place]      Prior Functioning/Environment Level of Independence: Needs assistance        Comments: per PT eval, uses rollator for mobility; unable to determine ADL assist today (pt unable to report)        OT Problem List: Decreased strength;Decreased activity tolerance;Impaired balance (sitting and/or standing);Decreased cognition;Decreased safety awareness;Decreased knowledge of use of DME or AE;Decreased knowledge of precautions;Decreased coordination      OT Treatment/Interventions: Self-care/ADL training;DME and/or AE instruction;Balance training;Patient/family education;Therapeutic activities    OT Goals(Current goals can be found in the care plan section) Acute Rehab OT Goals Patient Stated Goal: none stated OT Goal Formulation: With patient Time For Goal Achievement: 08/14/19 Potential to Achieve Goals: Good  OT Frequency: Min 2X/week   Barriers to D/C:            Co-evaluation PT/OT/SLP Co-Evaluation/Treatment: Yes Reason for Co-Treatment: Complexity of the  patient's impairments (multi-system involvement);Necessary to address cognition/behavior during functional activity;To address functional/ADL transfers;For patient/therapist safety PT goals addressed during session: Mobility/safety with mobility;Balance;Proper use of DME OT goals addressed during session: ADL's and self-care      AM-PAC OT "6 Clicks" Daily Activity     Outcome Measure Help from another person eating meals?: A Lot Help from another person taking care of personal grooming?: A Little Help from another person toileting, which includes using toliet, bedpan, or urinal?: Total Help from another person bathing (including washing, rinsing, drying)?: A Lot Help from another person to put on and taking off regular upper body clothing?: A Lot Help from another person to put on and taking off regular lower body clothing?: Total 6 Click Score: 11   End of Session Equipment Utilized During Treatment: Gait belt;Rolling walker;Oxygen Nurse Communication: Mobility status  Activity Tolerance: Patient tolerated treatment well Patient left: in chair;with call bell/phone within reach;with chair alarm set;with nursing/sitter in room  OT Visit Diagnosis: Muscle weakness (generalized) (M62.81);Other abnormalities of gait and mobility (R26.89)                Time: [id number] OT Time Calculation (min): 28 min Charges:  OT General Charges $OT Visit: 1 Visit OT Evaluation $OT Eval Moderate  Complexity: [place], [place] Acute Rehabilitation Services Pager [phone] Office [phone]    Delight Stare 07/31/2019, 12:28 PM

## 2019-07-31 NOTE — Progress Notes (Signed)
DAILY PROGRESS NOTE   Patient Name: Nicole Barker Date of Encounter: 07/31/2019 Cardiologist: Kirk Ruths, MD  Chief Complaint   None (due to dementia)  Patient Profile   85 yo female with progressive dementia, atrial fibrillation and falls  Subjective   Remains in afib with RVR on IV amiodarone - bp soft. Creatinine up overnight - given single dose IV lasix yesterday. Not currently on BP meds or lasix.  Objective   Vitals:   07/31/19 0030 07/31/19 0618 07/31/19 0805 07/31/19 0926  BP: 118/79 97/78    Pulse: 86 90  (!) 121  Resp: 18 20    Temp:  98.1 F (36.7 C)    TempSrc:      SpO2: 93% 90% 91%   Weight:  78.5 kg    Height:        Intake/Output Summary (Last 24 hours) at 07/31/2019 [id number] Last data filed at 07/31/2019 0600 Gross per 24 hour  Intake 606.6 ml  Output 400 ml  Net 206.6 ml   Filed Weights   07/29/19 1500 07/30/19 0509 07/31/19 0618  Weight: 76.2 kg 78 kg 78.5 kg    Physical Exam   General appearance: alert, no distress and not verbal Neck: no carotid bruit, no JVD and thyroid not enlarged, symmetric, no tenderness/mass/nodules Lungs: diminished breath sounds bibasilar Heart: irregularly irregular rhythm Abdomen: soft, non-tender; bowel sounds normal; no masses,  no organomegaly Extremities: extremities normal, atraumatic, no cyanosis or edema Pulses: 2+ and symmetric Skin: Skin color, texture, turgor normal. No rashes or lesions Neurologic: Mental status: confused, not verbal Psych: Demented  Inpatient Medications    Scheduled Meds:  apixaban  2.5 mg Oral BID   atorvastatin  10 mg Oral QHS   divalproex  250 mg Oral BID   escitalopram  10 mg Oral Daily   fluticasone furoate-vilanterol  1 puff Inhalation Daily   gabapentin  100 mg Oral BID   Melatonin  4.5 mg Oral QHS   polyethylene glycol  17 g Oral Daily    Continuous Infusions:  sodium chloride     amiodarone 30 mg/hr (07/31/19 0229)    PRN Meds: sodium  chloride, acetaminophen, albuterol, senna-docusate   Labs   Results for orders placed or performed during the hospital encounter of 07/29/19 (from the past 48 hour(s))  CBC with Differential/Platelet     Status: Abnormal   Collection Time: 07/29/19 10:23 AM  Result Value Ref Range   WBC 15.9 (H) 4.0 - 10.5 K/uL    Comment: WHITE COUNT CONFIRMED ON SMEAR   RBC 4.80 3.87 - 5.11 MIL/uL   Hemoglobin 13.8 12.0 - 15.0 g/dL   HCT 43.9 36.0 - 46.0 %   MCV 91.5 80.0 - 100.0 fL   MCH 28.8 26.0 - 34.0 pg   MCHC 31.4 30.0 - 36.0 g/dL   RDW 18.7 (H) 11.5 - 15.5 %   Platelets  150 - 400 K/uL    PLATELET CLUMPS NOTED ON SMEAR, COUNT APPEARS DECREASED   nRBC 1.1 (H) 0.0 - 0.2 %   Neutrophils Relative % 83 %   Neutro Abs 13.1 (H) 1.7 - 7.7 K/uL   Lymphocytes Relative 7 %   Lymphs Abs 1.2 0.7 - 4.0 K/uL   Monocytes Relative 9 %   Monocytes Absolute 1.5 (H) 0.1 - 1.0 K/uL   Eosinophils Relative 0 %   Eosinophils Absolute 0.0 0.0 - 0.5 K/uL   Basophils Relative 0 %   Basophils Absolute 0.0 0.0 - 0.1 K/uL  Immature Granulocytes 1 %   Abs Immature Granulocytes 0.13 (H) 0.00 - 0.07 K/uL    Comment: Performed at [facility], [place] [address], [place], [place] [address]  Comprehensive metabolic panel     Status: Abnormal   Collection Time: 07/29/19 10:23 AM  Result Value Ref Range   Sodium 138 135 - 145 mmol/L   Potassium 4.1 3.5 - 5.1 mmol/L   Chloride 97 (L) 98 - 111 mmol/L   CO2 24 22 - 32 mmol/L   Glucose, Bld 104 (H) 70 - 99 mg/dL   BUN 38 (H) 8 - 23 mg/dL   Creatinine, Ser 1.62 (H) 0.44 - 1.00 mg/dL   Calcium 9.2 8.9 - 10.3 mg/dL   Total Protein 5.4 (L) 6.5 - 8.1 g/dL   Albumin 2.9 (L) 3.5 - 5.0 g/dL   AST 62 (H) 15 - 41 U/L   ALT 39 0 - 44 U/L   Alkaline Phosphatase 106 38 - 126 U/L   Total Bilirubin 3.0 (H) 0.3 - 1.2 mg/dL   GFR calc non Af Amer 28 (L) >60 mL/min   GFR calc Af Amer 33 (L) >60 mL/min   Anion gap 17 (H) 5 - 15    Comment: Performed at [facility],  [place] [address] [place], [place] [address]  Brain natriuretic peptide     Status: Abnormal   Collection Time: 07/29/19 10:23 AM  Result Value Ref Range   B Natriuretic Peptide 1,882.7 (H) 0.0 - 100.0 pg/mL    Comment: Performed at [facility] [address] [place], [place] [address]  Lipase, blood     Status: None   Collection Time: 07/29/19 10:23 AM  Result Value Ref Range   Lipase 38 11 - 51 U/L    Comment: Performed at [facility] [address], [place], [place] [address]  SARS Coronavirus 2 [facility] order, Performed in [facility] hospital lab) Nasopharyngeal Nasopharyngeal Swab     Status: None   Collection Time: 07/29/19  2:19 PM   Specimen: Nasopharyngeal Swab  Result Value Ref Range   SARS Coronavirus 2 NEGATIVE NEGATIVE    Comment: (NOTE) If result is NEGATIVE SARS-CoV-2 target nucleic acids are NOT DETECTED. The SARS-CoV-2 RNA is generally detectable in upper and lower  respiratory specimens during the acute phase of infection. The lowest  concentration of SARS-CoV-2 viral copies this assay can detect is 250  copies / mL. A negative result does not preclude SARS-CoV-2 infection  and should not be used as the sole basis for treatment or other  patient management decisions.  A negative result may occur with  improper specimen collection / handling, submission of specimen other  than nasopharyngeal swab, presence of viral mutation(s) within the  areas targeted by this assay, and inadequate number of viral copies  (<250 copies / mL). A negative result must be combined with clinical  observations, patient history, and epidemiological information. If result is POSITIVE SARS-CoV-2 target nucleic acids are DETECTED. The SARS-CoV-2 RNA is generally detectable in upper and lower  respiratory specimens dur ing the acute phase of infection.  Positive  results are indicative of active infection with SARS-CoV-2.  Clinical  correlation with patient history and other  diagnostic information is  necessary to determine patient infection status.  Positive results do  not rule out bacterial infection or co-infection with other viruses. If result is PRESUMPTIVE POSTIVE SARS-CoV-2 nucleic acids MAY BE PRESENT.   A presumptive positive result  was obtained on the submitted specimen  and confirmed on repeat testing.  While 2019 novel coronavirus  (SARS-CoV-2) nucleic acids may be present in the submitted sample  additional confirmatory testing may be necessary for epidemiological  and / or clinical management purposes  to differentiate between  SARS-CoV-2 and other Sarbecovirus currently known to infect humans.  If clinically indicated additional testing with an alternate test  methodology [phone]) is advised. The SARS-CoV-2 RNA is generally  detectable in upper and lower respiratory sp ecimens during the acute  phase of infection. The expected result is Negative. Fact Sheet for Patients:  StrictlyIdeas.no Fact Sheet for Healthcare Providers: BankingDealers.co.za This test is not yet approved or cleared by the [place] FDA and has been authorized for detection and/or diagnosis of SARS-CoV-2 by FDA under an Emergency Use Authorization (EUA).  This EUA will remain in effect (meaning this test can be used) for the duration of the COVID-19 declaration under Section 564(b)(1) of the Act, 21 U.S.C. section 360bbb-3(b)(1), unless the authorization is terminated or revoked sooner. Performed at [facility], [place] [address], [place], [place] [address]   Culture, Urine     Status: Abnormal   Collection Time: 07/29/19  6:19 PM   Specimen: Urine, Clean Catch  Result Value Ref Range   Specimen Description URINE, CLEAN CATCH    Special Requests      NONE Performed at [facility], [place] [address], [place], [place] [address]    Culture 80,000 COLONIES/mL ESCHERICHIA COLI (A)    Report Status 07/31/2019  FINAL    Organism ID, Bacteria ESCHERICHIA COLI (A)       Susceptibility   Escherichia coli - MIC*    AMPICILLIN >=32 RESISTANT Resistant     CEFAZOLIN <=4 SENSITIVE Sensitive     CEFTRIAXONE <=1 SENSITIVE Sensitive     CIPROFLOXACIN >=4 RESISTANT Resistant     GENTAMICIN <=1 SENSITIVE Sensitive     IMIPENEM <=0.25 SENSITIVE Sensitive     NITROFURANTOIN <=16 SENSITIVE Sensitive     TRIMETH/SULFA >=320 RESISTANT Resistant     AMPICILLIN/SULBACTAM >=32 RESISTANT Resistant     PIP/TAZO 16 SENSITIVE Sensitive     Extended ESBL NEGATIVE Sensitive     * 80,000 COLONIES/mL ESCHERICHIA COLI  Urinalysis, Routine w reflex microscopic     Status: Abnormal   Collection Time: 07/29/19  6:25 PM  Result Value Ref Range   Color, Urine AMBER (A) YELLOW    Comment: BIOCHEMICALS MAY BE AFFECTED BY COLOR   APPearance HAZY (A) CLEAR   Specific Gravity, Urine 1.018 1.005 - 1.030   pH 5.0 5.0 - 8.0   Glucose, UA NEGATIVE NEGATIVE mg/dL   Hgb urine dipstick NEGATIVE NEGATIVE   Bilirubin Urine NEGATIVE NEGATIVE   Ketones, ur NEGATIVE NEGATIVE mg/dL   Protein, ur 30 (A) NEGATIVE mg/dL   Nitrite NEGATIVE NEGATIVE   Leukocytes,Ua TRACE (A) NEGATIVE   RBC / HPF 0-5 0 - 5 RBC/hpf   WBC, UA 0-5 0 - 5 WBC/hpf   Bacteria, UA NONE SEEN NONE SEEN   Squamous Epithelial / LPF 6-10 0 - 5   Mucus PRESENT    Hyaline Casts, UA PRESENT     Comment: Performed at [facility], [place] [address], [place], [place] [id number]  Basic metabolic panel     Status: Abnormal   Collection Time: 07/30/19  5:36 AM  Result Value Ref Range   Sodium 139 135 - 145 mmol/L   Potassium 4.8 3.5 -  5.1 mmol/L   Chloride 101 98 - 111 mmol/L   CO2 22 22 - 32 mmol/L   Glucose, Bld 89 70 - 99 mg/dL   BUN 47 (H) 8 - 23 mg/dL   Creatinine, Ser 1.47 (H) 0.44 - 1.00 mg/dL   Calcium 8.6 (L) 8.9 - 10.3 mg/dL   GFR calc non Af Amer 32 (L) >60 mL/min   GFR calc Af Amer 37 (L) >60 mL/min   Anion gap 16 (H) 5 - 15    Comment: Performed at  [facility] [address], [place], [place] [address]  CBC with Differential/Platelet     Status: Abnormal   Collection Time: 07/30/19 10:31 AM  Result Value Ref Range   WBC 11.6 (H) 4.0 - 10.5 K/uL   RBC 4.71 3.87 - 5.11 MIL/uL   Hemoglobin 13.6 12.0 - 15.0 g/dL   HCT 42.5 36.0 - 46.0 %   MCV 90.2 80.0 - 100.0 fL   MCH 28.9 26.0 - 34.0 pg   MCHC 32.0 30.0 - 36.0 g/dL   RDW 18.9 (H) 11.5 - 15.5 %   Platelets 88 (L) 150 - 400 K/uL    Comment: REPEATED TO VERIFY PLATELET COUNT CONFIRMED BY SMEAR SPECIMEN CHECKED FOR CLOTS Immature Platelet Fraction may be clinically indicated, consider ordering this additional test [id number]    nRBC 1.7 (H) 0.0 - 0.2 %   Neutrophils Relative % 77 %   Neutro Abs 9.0 (H) 1.7 - 7.7 K/uL   Lymphocytes Relative 11 %   Lymphs Abs 1.3 0.7 - 4.0 K/uL   Monocytes Relative 11 %   Monocytes Absolute 1.2 (H) 0.1 - 1.0 K/uL   Eosinophils Relative 0 %   Eosinophils Absolute 0.1 0.0 - 0.5 K/uL   Basophils Relative 0 %   Basophils Absolute 0.0 0.0 - 0.1 K/uL   Immature Granulocytes 1 %   Abs Immature Granulocytes 0.07 0.00 - 0.07 K/uL    Comment: Performed at [facility] [address], [place], [place] [id number]  Basic metabolic panel     Status: Abnormal   Collection Time: 07/31/19  6:25 AM  Result Value Ref Range   Sodium 139 135 - 145 mmol/L   Potassium 3.5 3.5 - 5.1 mmol/L   Chloride 98 98 - 111 mmol/L   CO2 27 22 - 32 mmol/L   Glucose, Bld 95 70 - 99 mg/dL   BUN 51 (H) 8 - 23 mg/dL   Creatinine, Ser 1.63 (H) 0.44 - 1.00 mg/dL   Calcium 8.5 (L) 8.9 - 10.3 mg/dL   GFR calc non Af Amer 28 (L) >60 mL/min   GFR calc Af Amer 32 (L) >60 mL/min   Anion gap 14 5 - 15    Comment: Performed at [facility] [address], [place], [place] [address]  CBC     Status: Abnormal   Collection Time: 07/31/19  6:25 AM  Result Value Ref Range   WBC 11.2 (H) 4.0 - 10.5 K/uL   RBC 4.36 3.87 - 5.11 MIL/uL   Hemoglobin 12.4 12.0 - 15.0 g/dL     HCT 39.5 36.0 - 46.0 %   MCV 90.6 80.0 - 100.0 fL   MCH 28.4 26.0 - 34.0 pg   MCHC 31.4 30.0 - 36.0 g/dL   RDW 19.0 (H) 11.5 - 15.5 %   Platelets 75 (L) 150 - 400 K/uL    Comment: REPEATED TO VERIFY Immature Platelet Fraction may be clinically indicated,  consider ordering this additional test [id number] CONSISTENT WITH PREVIOUS RESULT    nRBC 1.3 (H) 0.0 - 0.2 %    Comment: Performed at [facility], [place] [address], [place], [place] [address]    ECG   AFib with RVR - Personally Reviewed  Telemetry   Afib with RVR - Personally Reviewed  Radiology    Dg Chest Portable 1 View  Result Date: 07/29/2019 CLINICAL DATA:  Hypoxia EXAM: PORTABLE CHEST 1 VIEW COMPARISON:  07/07/2019 and prior exam FINDINGS: Cardiomegaly and mild pulmonary vascular congestion noted. Mild bibasilar atelectasis versus airspace disease noted. There may be very small bilateral pleural effusions present. No pneumothorax noted. No acute bony abnormalities are identified. IMPRESSION: Cardiomegaly, mild pulmonary vascular congestion with mild bibasilar atelectasis/airspace disease. Question very small bilateral pleural effusions. Electronically Signed   By: Margarette Canada M.D.   On: 07/29/2019 11:36   US Abdomen Limited Ruq  Result Date: 07/29/2019 CLINICAL DATA:  Elevated bilirubin EXAM: ULTRASOUND ABDOMEN LIMITED RIGHT UPPER QUADRANT COMPARISON:  July 26, 2019 CT FINDINGS: Gallbladder: The gallbladder was not visualized and is likely surgically absent as seen on prior CT. Common bile duct: Diameter: 2 mm Liver: No focal lesion identified. Within normal limits in parenchymal echogenicity. Portal vein is patent on color Doppler imaging with normal direction of blood flow towards the liver. Other: There is a right-sided pleural effusion. IMPRESSION: 1. Status post cholecystectomy. 2. No acute sonographic abnormality. 3. Again noted is a right-sided pleural effusion. Electronically Signed   By: Constance Holster M.D.    On: 07/29/2019 17:14    Cardiac Studies   N/A  Assessment   1. Active Problems: 2.   CHF (congestive heart failure) ([place]) 3.   Palliative care encounter 4.   Acute on chronic systolic CHF (congestive heart failure) ([place]) 5.   Frequent falls 6.   Pressure injury of skin 7.   Plan   1. 85 yo female with afib RVR and progressive dementia. Started on IV amiodarone yesterday - BP will not allow additional medications for rate-control. On low dose anticoagulation. Would continue IV amiodarone today - if rate control improves, could convert to po amiodarone. Agree with palliative care evaluation.  Time Spent Directly with Patient:  I have spent a total of 25 minutes with the patient reviewing hospital notes, telemetry, EKGs, labs and examining the patient as well as establishing an assessment and plan that was discussed personally with the patient.  > 50% of time was spent in direct patient care.  Length of Stay:  LOS: 2 days   Pixie Casino, MD, [facility], [place] Director of the Advanced Lipid Disorders &  Cardiovascular Risk Reduction Clinic Diplomate of the American Board of Clinical Lipidology Attending Cardiologist  Direct Dial: [phone]   Fax: [phone]  Website:  [email or web address] [email or web address] Darel Ricketts 07/31/2019, 9:28 AM

## 2019-08-01 DIAGNOSIS — I509 Heart failure, unspecified: Secondary | ICD-10-CM

## 2019-08-01 LAB — URINALYSIS, ROUTINE W REFLEX MICROSCOPIC
Bilirubin Urine: NEGATIVE
Glucose, UA: NEGATIVE mg/dL
Ketones, ur: NEGATIVE mg/dL
Nitrite: NEGATIVE
Protein, ur: NEGATIVE mg/dL
Specific Gravity, Urine: 1.01 (ref 1.005–1.030)
pH: 5 (ref 5.0–8.0)

## 2019-08-01 LAB — HEPATIC FUNCTION PANEL
ALT: 40 U/L (ref 0–44)
AST: 67 U/L — ABNORMAL HIGH (ref 15–41)
Albumin: 2.6 g/dL — ABNORMAL LOW (ref 3.5–5.0)
Alkaline Phosphatase: 223 U/L — ABNORMAL HIGH (ref 38–126)
Bilirubin, Direct: 1 mg/dL — ABNORMAL HIGH (ref 0.0–0.2)
Indirect Bilirubin: 1 mg/dL — ABNORMAL HIGH (ref 0.3–0.9)
Total Bilirubin: 2 mg/dL — ABNORMAL HIGH (ref 0.3–1.2)
Total Protein: 5.1 g/dL — ABNORMAL LOW (ref 6.5–8.1)

## 2019-08-01 LAB — CBC
HCT: 41.2 % (ref 36.0–46.0)
Hemoglobin: 13.4 g/dL (ref 12.0–15.0)
MCH: 29.5 pg (ref 26.0–34.0)
MCHC: 32.5 g/dL (ref 30.0–36.0)
MCV: 90.7 fL (ref 80.0–100.0)
Platelets: 70 10*3/uL — ABNORMAL LOW (ref 150–400)
RBC: 4.54 MIL/uL (ref 3.87–5.11)
RDW: 20.5 % — ABNORMAL HIGH (ref 11.5–15.5)
WBC: 10.9 10*3/uL — ABNORMAL HIGH (ref 4.0–10.5)
nRBC: 1.7 % — ABNORMAL HIGH (ref 0.0–0.2)

## 2019-08-01 LAB — BASIC METABOLIC PANEL
Anion gap: 13 (ref 5–15)
BUN: 50 mg/dL — ABNORMAL HIGH (ref 8–23)
CO2: 29 mmol/L (ref 22–32)
Calcium: 8.5 mg/dL — ABNORMAL LOW (ref 8.9–10.3)
Chloride: 98 mmol/L (ref 98–111)
Creatinine, Ser: 1.68 mg/dL — ABNORMAL HIGH (ref 0.44–1.00)
GFR calc Af Amer: 31 mL/min — ABNORMAL LOW (ref >60)
GFR calc non Af Amer: 27 mL/min — ABNORMAL LOW (ref >60)
Glucose, Bld: 90 mg/dL (ref 70–99)
Potassium: 4.1 mmol/L (ref 3.5–5.1)
Sodium: 140 mmol/L (ref 135–145)

## 2019-08-01 MED ORDER — FUROSEMIDE 10 MG/ML IJ SOLN
80.0000 mg | Freq: Once | INTRAMUSCULAR | Status: AC
  Administered 2019-08-01: 80 mg via INTRAVENOUS
  Filled 2019-08-01: qty 8

## 2019-08-01 MED ORDER — SODIUM CHLORIDE 0.9 % IV SOLN
1.0000 g | INTRAVENOUS | Status: AC
  Administered 2019-08-01: 1 g via INTRAVENOUS
  Filled 2019-08-01: qty 10

## 2019-08-01 MED ORDER — MELATONIN 3 MG PO TABS
6.0000 mg | ORAL_TABLET | Freq: Every day | ORAL | Status: DC
  Administered 2019-08-01 – 2019-08-02 (×2): 6 mg via ORAL
  Filled 2019-08-01 (×3): qty 2

## 2019-08-01 MED ORDER — AMIODARONE HCL 200 MG PO TABS
200.0000 mg | ORAL_TABLET | Freq: Two times a day (BID) | ORAL | Status: DC
  Administered 2019-08-01 – 2019-08-03 (×5): 200 mg via ORAL
  Filled 2019-08-01 (×5): qty 1

## 2019-08-01 NOTE — Progress Notes (Signed)
Unable to get oral temp, rectal temp 96.5, provider notified.

## 2019-08-01 NOTE — Progress Notes (Signed)
Progress Note  Patient Name: Nicole Barker Date of Encounter: 08/01/2019  Primary Cardiologist: Kirk Ruths, MD   Subjective   Afib rate improved overnight. Palliative care consulted. Given falls, may not be an anticoagulation candidate.  Inpatient Medications    Scheduled Meds: . acetaminophen  650 mg Oral Q6H  . amiodarone  200 mg Oral BID  . apixaban  2.5 mg Oral BID  . atorvastatin  10 mg Oral QHS  . divalproex  250 mg Oral BID  . escitalopram  10 mg Oral Daily  . fluticasone furoate-vilanterol  1 puff Inhalation Daily  . furosemide  80 mg Intravenous Once  . gabapentin  100 mg Oral BID  . Melatonin  4.5 mg Oral QHS  . polyethylene glycol  17 g Oral Daily   Continuous Infusions: . sodium chloride     PRN Meds: sodium chloride, albuterol, senna-docusate   Vital Signs    Vitals:   08/01/19 0611 08/01/19 0727 08/01/19 0756 08/01/19 0825  BP: 95/69   120/70  Pulse: 87   82  Resp:    16  Temp: 98.4 F (36.9 C) 98.3 F (36.8 C)  (!) 97.4 F (36.3 C)  TempSrc: Oral Axillary    SpO2:   100% 100%  Weight:      Height:        Intake/Output Summary (Last 24 hours) at 08/01/2019 0836 Last data filed at 08/01/2019 0817 Gross per 24 hour  Intake 1007.15 ml  Output 1150 ml  Net -142.85 ml   Filed Weights   07/30/19 0509 07/31/19 0618 08/01/19 0405  Weight: 78 kg 78.5 kg 79.4 kg    Telemetry    Afib with CVR - Personally Reviewed  Physical Exam   GEN: No acute distress.   Neck: No JVD, no carotid bruits Cardiac: irregularly irregular, no murmurs, rubs, or gallops.  Respiratory: Clear to auscultation bilaterally, no wheezes/ rales/ rhonchi GI: NABS, Soft, nontender, non-distended  MS: No edema; No deformity. Neuro:  Nonfocal, moving all extremities spontaneously Psych: quiet, confused  Labs    Chemistry Recent Labs  Lab 07/26/19 0940 07/29/19 1023 07/30/19 0536 07/31/19 0625 08/01/19 0402  NA 135 138 139 139 140  K 4.0 4.1 4.8 3.5  4.1  CL 95* 97* 101 98 98  CO2 _0 GLUCOSE 97 104* 89 95 90  BUN 29* 38* 47* 51* 50*  CREATININE 1.60* 1.62* 1.47* 1.63* 1.68*  CALCIUM 9.2 9.2 8.6* 8.5* 8.5*  PROT 6.4* 5.4*  --   --   --   ALBUMIN 3.5 2.9*  --   --   --   AST 50* 62*  --   --   --   ALT 37 39  --   --   --   ALKPHOS 116 106  --   --   --   BILITOT 1.8* 3.0*  --   --   --   GFRNONAA 29* 28* 32* 28* 27*  GFRAA 33* 33* 37* 32* 31*  ANIONGAP 13 17* 16* 14 13     Hematology Recent Labs  Lab 07/29/19 1023 07/30/19 1031 07/31/19 0625  WBC 15.9* 11.6* 11.2*  RBC 4.80 4.71 4.36  HGB 13.8 13.6 12.4  HCT 43.9 42.5 39.5  MCV 91.5 90.2 90.6  MCH 28.8 28.9 28.4  MCHC 31.4 32.0 31.4  RDW 18.7* 18.9* 19.0*  PLT PLATELET CLUMPS NOTED ON SMEAR, COUNT APPEARS DECREASED 88* 75*    Cardiac EnzymesNo results for  input(s): TROPONINI in the last 168 hours. No results for input(s): TROPIPOC in the last 168 hours.   BNP Recent Labs  Lab 07/29/19 1023  BNP 1,882.7*     DDimer No results for input(s): DDIMER in the last 168 hours.   Radiology    No results found.  Cardiac Studies   Echocardiogram 10/2018: Study Conclusions  - Left ventricle: The cavity size was normal. Wall thickness wasincreased in a pattern of mild LVH. Systolic function was mildlyreduced. The estimated ejection fraction was in the range of 45%to 50%. Diffuse hypokinesis. Doppler parameters are consistentwith high ventricular filling pressure. - Aortic valve: Valve mobility was restricted. There was mildstenosis. There was mild regurgitation. - Mitral valve: Calcified annulus. The findings are consistent withmild stenosis. There was moderate regurgitation. - Left atrium: The atrium was mildly dilated. - Tricuspid valve: There was moderate regurgitation. - Pulmonary arteries: Systolic pressure was moderately to severelyincreased. PA peak pressure: 65 mm Hg (S). - Pericardium, extracardiac: There was a left pleural effusion.   Impressions:  - Mild global reduction in LV systolic function; mild LVH; calcified aortic valve with mild AS (mean gradient 15 mmHg) andmild AI; MAC with mild MS (mean gradient 5 mmHg); moderate MR;mild LAE; moderate TR; moderate to severe pulmonary hypertension.  Patient Profile     85 y.o. female with PMH of paroxysmal atrial fibrillation, HTN, HLD, and dementia who is being followed by cardiology for atrial fibrillation  Assessment & Plan    1. Paroxysmal atrial fibrillation: Patient was started on IV amiodarone for management of Afib RVR with improvement in rate to the 80s.  - Will transition to po amiodarone 217m BID x2 weeks, then decrease dose to 20373mdaily.  - Could consider stopping apixaban given increased falls to minimize bleeding risk. Continue apixaban 2.73m69mID for now (dose adjusted for age and Cr >1.5)  2. Advanced dementia: progressively declining. Increased falls. Palliative care consulted - Continue management per primary team and palliative care.   3. HTN: BP stable without antihypertensive agents - Continue to monitor  4. HLD: on atorvastatin - Favor stopping statin given advanced dementia and progression towards palliative care.    For questions or updates, please contact [email or web address] consult [email or web address] for contact info under Cardiology/STEMI.      KenPixie CasinoD, FACTexas Midwest Surgery CenterACDowningtownrector of the Advanced Lipid Disorders &  Cardiovascular Risk Reduction Clinic Diplomate of the American Board of Clinical Lipidology Attending Cardiologist  Direct Dial: [phone]: [phone]:  [email or web address]

## 2019-08-01 NOTE — Progress Notes (Addendum)
Family Medicine Teaching Service Daily Progress Note Intern Pager: [phone]  Patient name: Nicole Barker Medical record number: [id number] Date of birth: [birth year] Age: 85 y.o. Gender: female  Primary Care Provider: Merlene Laughter, MD Consultants: Cards, Palliative  Code Status: DNR   Pt Overview and Major Events to Date:  Hospital Day: 4 07/29/2019: admitted for Dyspnea   Assessment and Plan: Nicole Barker is a 85 y.o. female who presented from ALF w/ dyspnea and noted to be less responsive, eating and drinking less.  Her PMH is significant for HFrEF, paroxysmal A. fib on anticoagulation, dementia, HTN, HLD, CKD, stage III.    #Acute Hypoxemic respiratory failure #Acute on chronic HFrEF  #Asthma #COPD Stable. Requiring 2 L. COVID negative. Patient is breathing comfortably and sitting up in chair.  Weight on admission 168 lbs and now 175 lbs.  Not good documented UOP again today, but patient with decreased O2 requirement.  Oxygen as needed >90%  IV Lasix 80 mg, monitor UOP  Urine is dark this morning and does not appear to be making much, will obtain LFT's and bladder scan  #Paroxysmal A. fib  Patient in A. fib with RVR. Patient was previously to be discharged on 200 mg daily of amiodarone, unclear why this was discontinued.  Amiodarone restarted on 07/30/2019.  Cardiology following, appreciate recommendations  Will transition to po amiodarone 200mg  BID x2weeks, then decrease to 200mg  daily  #Dementia Has increased need of care.  4 falls in the last 7 days with no acute abnormalities on imaging.  Patient speech and cognitive function also declining progressively. Per daughter, she wants to keep patient comfortable and does not want to pursue aggressive medical care.  Continue Depakote  Delirium precautions, Avoid sedation  Palliative consult  CMN CSW consulted  Soft diet with aspiration precautions  PT/OT/SLP  #Thrombocytopenia Plt 167 on 08/05; 70  on 08/11. Previously with clumping. Has not had heparin or other VTE prophylaxis  Continue to monitor  Pathologist to review smear, patient stable, no signs of bleeding  #80,000 colonies E. Coli on urine culture Patient unable to verbalize any symptoms.  No signs of infection but difficult to discern with patient's baseline dementia.  Did have hypothermia overnight to 96 and with bear hugger and blankets did increase to 98.4 this a.m.  Patient comfortable during his episodes overnight.  Unclear if this is due to infection or patient declining.  Susceptible to CTX (08/10-), on day 2  Will consider switching to oral medications on 08/01/2019  #Hyperbilirubinemia: Patient with dark colored urine this am. Renal function at baseline. Bladder scan with  114mL, has has 400cc UOP this am.   Obtain UA, previously no bili in urine  #Hypertension Stable, not on any antihypertensives at home.    Monitor    #Hyperlipidemia  Discontinue atorvastatin given dementia and GOC  #CKD 3 Cr 1.68 this morning which is baseline   Trend BMP / urinary output Replace electrolytes as indicated Avoid nephrotoxic agents, ensure adequate renal perfusion  #Chronic pain  Continue home acetominophen sched and 100 mg gabapentin BID    #Moderate protein-calorie Malnutrition Severe dementia, worsening  Palliative following  Nutrition consult  #FEN/GI:  . Fluids: none . Nutrition: dysphagia 3, mechanical soft   Access: Purewick (07/29/19) VTE prophylaxis: Chronically Anticoagulated - Apixaban 2.5mg  BID   Disposition: pending medical work up  Subjective:  Laying in bed, not complaining of pain, attempting to drink form straw with nurse.  Objective: Temp:  [96.5 F (35.8 C)-99 F (  37.2 C)] 99 F (37.2 C) (08/11 1243) Pulse Rate:  [61-107] 107 (08/11 1232) Cardiac Rhythm: Atrial fibrillation (08/11 1243) Resp:  [16-19] 19 (08/11 1232) BP: (95-157)/(67-138) 98/85 (08/11 1232) SpO2:  [77 %-100 %]  98 % (08/11 1232) FiO2 (%):  [84 %] 84 % (08/11 1100) Weight:  [79.4 kg] 79.4 kg (08/11 0405) Intake/Output      08/10 0701 - 08/11 0700 08/11 0701 - 08/12 0700   P.O. 440 360   I.V. (mL/kg) 447.2 (5.6) 45 (0.6)   IV Piggyback  100   Total Intake(mL/kg) 887.2 (11.2) 505 (6.4)   Urine (mL/kg/hr) 1150 (0.6) 589 (1.2)   Total Output 1150 589   Net -262.9 -84            Physical Exam: General: NAD, pleasant, laying in bed Cardiovascular: RRR, no m/r/g, 2+ pititng BLLE edema Respiratory: diminished breath sounds throughout, normal work of breathing on 2L [place] Derm: no rashes appreciated Neuro: CN II-XII grossly intact Psych: AO to self only, appropriate affect  Laboratory: I have personally read and reviewed all labs and imaging studies.  CBC: Recent Labs  Lab 07/26/19 0940 07/29/19 1023 07/30/19 1031 07/31/19 0625 08/01/19 1144  WBC 16.5* 15.9* 11.6* 11.2* 10.9*  NEUTROABS 13.6* 13.1* 9.0*  --   --   HGB 14.4 13.8 13.6 12.4 13.4  HCT 47.3* 43.9 42.5 39.5 41.2  MCV 90.8 91.5 90.2 90.6 90.7  PLT 167 PLATELET CLUMPS NOTED ON SMEAR, COUNT APPEARS DECREASED 88* 75* 70*   CMP: Recent Labs  Lab 07/26/19 0940 07/29/19 1023 07/30/19 0536 07/31/19 0625 08/01/19 0402 08/01/19 1144  NA 135 138 139 139 140  --   K 4.0 4.1 4.8 3.5 4.1  --   CL 95* 97* 101 98 98  --   CO2 27 24 22 27 29   --   GLUCOSE 97 104* 89 95 90  --   BUN 29* 38* 47* 51* 50*  --   CREATININE 1.60* 1.62* 1.47* 1.63* 1.68*  --   CALCIUM 9.2 9.2 8.6* 8.5* 8.5*  --   ALBUMIN 3.5 2.9*  --   --   --  2.6*     Micro: Covid Negative 8/8 Urine Culture   Imaging/Diagnostic Tests: Dg Thoracic Spine W/swimmers Result Date: 07/29/2019 IMPRESSION: No thoracic spine fracture or subluxation. Moderate thoracic spondylosis.   Dg Lumbar Spine Complete Result Date: 07/29/2019 IMPRESSION: 1. Acute anterior inferior L4 vertebral corner fracture. 2. Anterolisthesis at L3-4 measuring 7 mm. 3. Severe multilevel lumbar  degenerative disc disease.   Ct Head Wo Contrast Result Date: 07/29/2019 IMPRESSION: 1. No acute intracranial abnormality. Atrophy, chronic ischemic microangiopathy and intracranial atherosclerosis. 2. No fracture or static subluxation of the cervical spine.   Ct Cervical Spine Wo Contrast Result Date: 07/29/2019 IMPRESSION: 1. No acute intracranial abnormality. Atrophy, chronic ischemic microangiopathy and intracranial atherosclerosis. 2. No fracture or static subluxation of the cervical spine.   Dg Chest Portable 1 View Result Date: 07/29/2019 IMPRESSION: Cardiomegaly, mild pulmonary vascular congestion with mild bibasilar atelectasis/airspace disease. Question very small bilateral pleural effusions.   Dg Shoulder Left Result Date: 07/29/2019 IMPRESSION: No fracture or dislocation in the left shoulder. High-riding left humeral head suggests complete rotator cuff tear or CPPD arthropathy. Moderate left acromioclavicular and glenohumeral joint osteoarthritis.   US Abdomen Limited Ruq Result Date: 07/29/2019 IMPRESSION: 1. Status post cholecystectomy. 2. No acute sonographic abnormality. 3. Again noted is a right-sided pleural effusion.    Goerge Mohr, [place], DO 08/01/2019, 1:13 PM PGY-3,  [facility] Intern pager: [phone], text pages welcome

## 2019-08-01 NOTE — Progress Notes (Signed)
Pt pulled out iv, pt keeps pulling off gown and telemetry,paged MD ot advise if need to reinsert and can we dc telemetry

## 2019-08-01 NOTE — Progress Notes (Signed)
Upon chart review, order for vs q 2 hours, pt has not been low temp, hr 100's in afib, ra 95-100% , bp softer at lunch after iv lasix but pt asx, paged MD to clarify order

## 2019-08-01 NOTE — Progress Notes (Signed)
dtr [name] called for update, reports she will not be in today as she has to go to work paged MD to advise of calling dtr for update, she asked about tranfer and advised case management will followup as waiting on bed offer yesterday

## 2019-08-02 LAB — BASIC METABOLIC PANEL
Anion gap: 14 (ref 5–15)
BUN: 45 mg/dL — ABNORMAL HIGH (ref 8–23)
CO2: 30 mmol/L (ref 22–32)
Calcium: 8.9 mg/dL (ref 8.9–10.3)
Chloride: 96 mmol/L — ABNORMAL LOW (ref 98–111)
Creatinine, Ser: 1.42 mg/dL — ABNORMAL HIGH (ref 0.44–1.00)
GFR calc Af Amer: 38 mL/min — ABNORMAL LOW (ref >60)
GFR calc non Af Amer: 33 mL/min — ABNORMAL LOW (ref >60)
Glucose, Bld: 94 mg/dL (ref 70–99)
Potassium: 4.2 mmol/L (ref 3.5–5.1)
Sodium: 140 mmol/L (ref 135–145)

## 2019-08-02 LAB — PATHOLOGIST SMEAR REVIEW

## 2019-08-02 LAB — SARS CORONAVIRUS 2 (TAT 6-24 HRS): SARS Coronavirus 2: NEGATIVE

## 2019-08-02 MED ORDER — METOPROLOL TARTRATE 12.5 MG HALF TABLET
12.5000 mg | ORAL_TABLET | Freq: Two times a day (BID) | ORAL | Status: DC
  Administered 2019-08-02 – 2019-08-03 (×3): 12.5 mg via ORAL
  Filled 2019-08-02 (×3): qty 1

## 2019-08-02 MED ORDER — POLYETHYLENE GLYCOL 3350 17 G PO PACK: 17.0000 g | PACK | Freq: Every day | ORAL | Status: DC | PRN

## 2019-08-02 MED ORDER — TRAMADOL HCL 50 MG PO TABS
25.0000 mg | ORAL_TABLET | Freq: Two times a day (BID) | ORAL | Status: DC | PRN
  Administered 2019-08-03: 25 mg via ORAL
  Filled 2019-08-02: qty 1

## 2019-08-02 MED ORDER — CEPHALEXIN 250 MG PO CAPS
250.0000 mg | ORAL_CAPSULE | Freq: Three times a day (TID) | ORAL | Status: DC
  Administered 2019-08-02 – 2019-08-03 (×4): 250 mg via ORAL
  Filled 2019-08-02 (×4): qty 1

## 2019-08-02 MED ORDER — TORSEMIDE 20 MG PO TABS
20.0000 mg | ORAL_TABLET | Freq: Every day | ORAL | Status: DC
  Administered 2019-08-02 – 2019-08-03 (×2): 20 mg via ORAL
  Filled 2019-08-02 (×2): qty 1

## 2019-08-02 NOTE — Progress Notes (Addendum)
Family Medicine Teaching Service Daily Progress Note Intern Pager: [phone]  Patient name: Nicole Barker Medical record number: [id number] Date of birth: [birth year] Age: 85 y.o. Gender: female  Primary Care Provider: Merlene Laughter, MD Consultants: Cards, Palliative  Code Status: DNR   Pt Overview and Major Events to Date:  Hospital Day: 5 07/29/2019: admitted for Dyspnea   Assessment and Plan: Nicole Barker is a 85 y.o. female who presented from ALF w/ dyspnea and noted to be less responsive, eating and drinking less.  Her PMH is significant for HFrEF, paroxysmal A. fib on anticoagulation, dementia, HTN, HLD, CKD, stage III.    #Acute Hypoxemic respiratory failure #Acute on chronic HFmrEF (EF 45-50%) #Asthma #COPD Improved. Patient stable for discharge. Patient is breathing comfortably and sitting up in bed.  Weight on admission 168 lbs and now 177 lbs, but unsure if these are accurate.  Better UOP and now on RA.   Oxygen as needed >90%  Will switch to oral torsemide 20mg  daily   #Paroxysmal A. fib  Patient in A. fib with RVR. Patient was previously to be discharged on 200 mg daily of amiodarone, unclear why this was discontinued.  Amiodarone restarted on 07/30/2019.  Cardiology following, appreciate recommendations  Will transition to po amiodarone 200mg  BID x2weeks, then decrease to 200mg  daily  Per cardiology, will also start metoprolol for better rate control  #Dementia Has increased need of care.  4 falls in the last 7 days with no acute abnormalities on imaging.  Patient speech and cognitive function also declining progressively. Per daughter, she wants to keep patient comfortable and does not want to pursue aggressive medical care.  Continue Depakote  Delirium precautions, Avoid sedation  Palliative consult  CMN CSW consulted  Soft diet with aspiration precautions, please feed patient  PT/OT/SLP  #Thrombocytopenia Plt 167 on 08/05; 70 on  08/11. Previously with clumping. Has not had heparin or other VTE prophylaxis  Continue to monitor  Pathologist to review smear, patient stable, no signs of bleeding  #80,000 colonies E. Coli on urine culture Patient unable to verbalize any symptoms.  No signs of infection but difficult to discern with patient's baseline dementia.  Susceptible to CTX (08/10-11), will start keflex to continue treatment for 5 days total, patient to receive 3 more days  #Hyperbilirubinemia: Patient with dark colored urine this am. Renal function at baseline. Bladder scan with  162mL, has has 400cc UOP this am.   UA with moderate hgb but 0-5 RBCs on micro and urine color has improved  #Hypertension Stable, not on any antihypertensives at home.    Monitor    #Hyperlipidemia  Discontinue atorvastatin given dementia and GOC  #CKD 3 Cr 1.40 this morning which is baseline   Trend BMP / urinary output Replace electrolytes as indicated Avoid nephrotoxic agents, ensure adequate renal perfusion  #Chronic pain  Continue home acetominophen sched and 100 mg gabapentin BID    Will add tramadol 25mg  q12hrn for breakthrough pain, try to avoid given dementia and may worsen delirium  #Moderate protein-calorie Malnutrition Severe dementia, worsening  Palliative following  Nutrition consult  #FEN/GI:  . Fluids: none . Nutrition: dysphagia 3, mechanical soft   Access: Purewick (07/29/19) VTE prophylaxis: Chronically Anticoagulated - Apixaban 2.5mg  BID   Disposition: stable for discharge  Subjective:  Patient complaining of back pain. Otherwise well appearing  Objective: Temp:  [97.4 F (36.3 C)-99 F (37.2 C)] 98 F (36.7 C) (08/12 0313) Pulse Rate:  [82-127] 119 (08/12 0313) Cardiac Rhythm: Atrial  fibrillation (08/12 0711) Resp:  [16-19] 18 (08/12 0313) BP: (98-120)/(70-91) 110/89 (08/12 0313) SpO2:  [97 %-100 %] 98 % (08/12 0313) FiO2 (%):  [84 %] 84 % (08/11 1100) Weight:  [80.4 kg] 80.4 kg  (08/12 0313) Intake/Output      08/11 0701 - 08/12 0700 08/12 0701 - 08/13 0700   P.O. 600    I.V. (mL/kg) 45 (0.6)    IV Piggyback 100    Total Intake(mL/kg) 745 (9.3)    Urine (mL/kg/hr) 1314 (0.7)    Stool 0    Total Output 1314    Net -569         Urine Occurrence 1 x    Stool Occurrence 2 x        Physical Exam: General: NAD, pleasant, laying up in bed Cardiovascular: 1+ pitting BL LE edema Respiratory: normal work of breathing on RA Gastrointestinal: soft, nontender, nondistended Derm: no rashes appreciated Neuro: CN II-XII grossly intact Psych: AO to self only, appropriate affect  Laboratory: I have personally read and reviewed all labs and imaging studies.  CBC: Recent Labs  Lab 07/26/19 0940 07/29/19 1023 07/30/19 1031 07/31/19 0625 08/01/19 1144  WBC 16.5* 15.9* 11.6* 11.2* 10.9*  NEUTROABS 13.6* 13.1* 9.0*  --   --   HGB 14.4 13.8 13.6 12.4 13.4  HCT 47.3* 43.9 42.5 39.5 41.2  MCV 90.8 91.5 90.2 90.6 90.7  PLT 167 PLATELET CLUMPS NOTED ON SMEAR, COUNT APPEARS DECREASED 88* 75* 70*   CMP: Recent Labs  Lab 07/26/19 0940 07/29/19 1023  07/31/19 0625 08/01/19 0402 08/01/19 1144 08/02/19 0449  NA 135 138   < > 139 140  --  140  K 4.0 4.1   < > 3.5 4.1  --  4.2  CL 95* 97*   < > 98 98  --  96*  CO2 27 24   < > 27 29  --  30  GLUCOSE 97 104*   < > 95 90  --  94  BUN 29* 38*   < > 51* 50*  --  45*  CREATININE 1.60* 1.62*   < > 1.63* 1.68*  --  1.42*  CALCIUM 9.2 9.2   < > 8.5* 8.5*  --  8.9  ALBUMIN 3.5 2.9*  --   --   --  2.6*  --    < > = values in this interval not displayed.     Micro: Covid Negative 8/8 Urine Culture   Imaging/Diagnostic Tests: Dg Thoracic Spine W/swimmers Result Date: 07/29/2019 IMPRESSION: No thoracic spine fracture or subluxation. Moderate thoracic spondylosis.   Dg Lumbar Spine Complete Result Date: 07/29/2019 IMPRESSION: 1. Acute anterior inferior L4 vertebral corner fracture. 2. Anterolisthesis at L3-4 measuring 7 mm.  3. Severe multilevel lumbar degenerative disc disease.   Ct Head Wo Contrast Result Date: 07/29/2019 IMPRESSION: 1. No acute intracranial abnormality. Atrophy, chronic ischemic microangiopathy and intracranial atherosclerosis. 2. No fracture or static subluxation of the cervical spine.   Ct Cervical Spine Wo Contrast Result Date: 07/29/2019 IMPRESSION: 1. No acute intracranial abnormality. Atrophy, chronic ischemic microangiopathy and intracranial atherosclerosis. 2. No fracture or static subluxation of the cervical spine.   Dg Chest Portable 1 View Result Date: 07/29/2019 IMPRESSION: Cardiomegaly, mild pulmonary vascular congestion with mild bibasilar atelectasis/airspace disease. Question very small bilateral pleural effusions.   Dg Shoulder Left Result Date: 07/29/2019 IMPRESSION: No fracture or dislocation in the left shoulder. High-riding left humeral head suggests complete rotator cuff tear or CPPD arthropathy. Moderate  left acromioclavicular and glenohumeral joint osteoarthritis.   US Abdomen Limited Ruq Result Date: 07/29/2019 IMPRESSION: 1. Status post cholecystectomy. 2. No acute sonographic abnormality. 3. Again noted is a right-sided pleural effusion.    Verenise Moulin, [place], DO 08/02/2019, 8:19 AM PGY-3, [place] Intern pager: [phone], text pages welcome

## 2019-08-02 NOTE — Progress Notes (Signed)
Progress Note  Patient Name: Nicole Barker Date of Encounter: 08/02/2019  Primary Cardiologist: Kirk Ruths, MD   Subjective   Daughter at bedside. Anticipating transition to hospice care. Daughter states patient looks worse today. No specific complaints but patient says she does not feel well. She reports being cold. Adjusted thermostat in her room.   Inpatient Medications    Scheduled Meds: . acetaminophen  650 mg Oral Q6H  . amiodarone  200 mg Oral BID  . apixaban  2.5 mg Oral BID  . cephALEXin  250 mg Oral Q8H  . divalproex  250 mg Oral BID  . escitalopram  10 mg Oral Daily  . fluticasone furoate-vilanterol  1 puff Inhalation Daily  . gabapentin  100 mg Oral BID  . Melatonin  6 mg Oral QHS  . polyethylene glycol  17 g Oral Daily  . torsemide  20 mg Oral Daily   Continuous Infusions: . sodium chloride     PRN Meds: sodium chloride, albuterol, senna-docusate   Vital Signs    Vitals:   08/01/19 1243 08/01/19 1941 08/02/19 0313 08/02/19 0844  BP:  (!) 114/91 110/89   Pulse:  (!) 127 (!) 119 (!) 111  Resp:   18 18  Temp: 99 F (37.2 C) 98 F (36.7 C) 98 F (36.7 C)   TempSrc:   Oral   SpO2:  97% 98% 97%  Weight:   80.4 kg   Height:        Intake/Output Summary (Last 24 hours) at 08/02/2019 0943 Last data filed at 08/02/2019 0925 Gross per 24 hour  Intake 744.99 ml  Output 1314 ml  Net -569.01 ml   Filed Weights   07/31/19 0618 08/01/19 0405 08/02/19 0313  Weight: 78.5 kg 79.4 kg 80.4 kg    Telemetry    Afib with RVR, rates primarily in the 110s - Personally Reviewed  Physical Exam   GEN: Laying in bed sleeping in no acute distress.   Neck: No JVD, no carotid bruits Cardiac: IRIR, no murmurs, rubs, or gallops.  Respiratory: Clear to auscultation bilaterally, no wheezes/ rales/ rhonchi GI: NABS, Soft, nontender, non-distended  MS: No edema; No deformity. Neuro:  Opens eyes but not responsive, moving all extremities spontaneously Psych:  sleeping   Labs    Chemistry Recent Labs  Lab 07/29/19 1023  07/31/19 0625 08/01/19 0402 08/01/19 1144 08/02/19 0449  NA 138   < > 139 140  --  140  K 4.1   < > 3.5 4.1  --  4.2  CL 97*   < > 98 98  --  96*  CO2 24   < > 27 29  --  30  GLUCOSE 104*   < > 95 90  --  94  BUN 38*   < > 51* 50*  --  45*  CREATININE 1.62*   < > 1.63* 1.68*  --  1.42*  CALCIUM 9.2   < > 8.5* 8.5*  --  8.9  PROT 5.4*  --   --   --  5.1*  --   ALBUMIN 2.9*  --   --   --  2.6*  --   AST 62*  --   --   --  67*  --   ALT 39  --   --   --  40  --   ALKPHOS 106  --   --   --  223*  --   BILITOT 3.0*  --   --   --  2.0*  --   GFRNONAA 28*   < > 28* 27*  --  33*  GFRAA 33*   < > 32* 31*  --  38*  ANIONGAP 17*   < > 14 13  --  14   < > = values in this interval not displayed.     Hematology Recent Labs  Lab 07/30/19 1031 07/31/19 0625 08/01/19 1144  WBC 11.6* 11.2* 10.9*  RBC 4.71 4.36 4.54  HGB 13.6 12.4 13.4  HCT 42.5 39.5 41.2  MCV 90.2 90.6 90.7  MCH 28.9 28.4 29.5  MCHC 32.0 31.4 32.5  RDW 18.9* 19.0* 20.5*  PLT 88* 75* 70*    Cardiac EnzymesNo results for input(s): TROPONINI in the last 168 hours. No results for input(s): TROPIPOC in the last 168 hours.   BNP Recent Labs  Lab 07/29/19 1023  BNP 1,882.7*     DDimer No results for input(s): DDIMER in the last 168 hours.   Radiology    No results found.  Cardiac Studies   Echocardiogram 10/2018: Study Conclusions  - Left ventricle: The cavity size was normal. Wall thickness wasincreased in a pattern of mild LVH. Systolic function was mildlyreduced. The estimated ejection fraction was in the range of 45%to 50%. Diffuse hypokinesis. Doppler parameters are consistentwith high ventricular filling pressure. - Aortic valve: Valve mobility was restricted. There was mildstenosis. There was mild regurgitation. - Mitral valve: Calcified annulus. The findings are consistent withmild stenosis. There was moderate regurgitation. -  Left atrium: The atrium was mildly dilated. - Tricuspid valve: There was moderate regurgitation. - Pulmonary arteries: Systolic pressure was moderately to severelyincreased. PA peak pressure: 65 mm Hg (S). - Pericardium, extracardiac: There was a left pleural effusion.  Impressions:  - Mild global reduction in LV systolic function; mild LVH; calcified aortic valve with mild AS (mean gradient 15 mmHg) andmild AI; MAC with mild MS (mean gradient 5 mmHg); moderate MR;mild LAE; moderate TR; moderate to severe pulmonary hypertension.  Patient Profile     85 y.o. female with PMH of paroxysmal atrial fibrillation, HTN, HLD, and dementia who is being followed by cardiology for atrial fibrillation  Assessment & Plan    1. Paroxysmal atrial fibrillation: HR stable on po amiodarone - Continue po amiodarone 257m BID x2 weeks, then decrease dose to 2064mdaily.  - Will add metoprolol 12.61m50mID for improved rate control - Could consider stopping apixaban given increased falls to minimize bleeding risk if this is in line with daughters goals of care for patient, though if patient is to be bed bound, would be reasonable to continue. Continue apixaban 2.61mg33mD for now (dose adjusted for age and Cr >1.5)  2. Advanced dementia: progressively declining. Increased falls. Palliative care following. Daughter considering hospice care. - Continue management per primary team and palliative care.   3. HTN: BP stable  - Managed in the context of #1  4. HLD: atorvastatin stopped this admission as risks outweigh benefits     For questions or updates, please contact [facility] consult [email or web address] for contact info under Cardiology/STEMI.      Signed, KrisAbigail Butts-C  08/02/2019, 9:43 AM   [phone]

## 2019-08-02 NOTE — Progress Notes (Addendum)
FPTS Interim Progress Note  Attempted to call daughter [name] at [phone] per the number in the chart. Left voicemail letting daughter know that patient is doing well in the hospital and is now on oral medications. She is to have the nurse page [place] if she would like to have any further questions answered.  We have missed her multiple times unfortunately.  **Spoke with [name] over the phone and let her know that if her COVID is negative she would likely be able to be discharged tomorrow or the next day; also confirmed this with the Education officer, museum.  Answered all of her questions.   Celso Granja, [place], DO 08/02/2019, 4:02 PM PGY-3, [facility] Medicine Service pager [phone]

## 2019-08-02 NOTE — Care Management Important Message (Signed)
Important Message  Patient Details  Name: Nicole Barker MRN: [id number] Date of Birth: [birth year]   Medicare Important Message Given:  Yes     Shelda Altes 08/02/2019, 2:33 PM

## 2019-08-02 NOTE — TOC Transition Note (Signed)
Transition of Care [facility]) - CM/SW Discharge Note   Patient Details  Name: Nicole Barker MRN: [id number] Date of Birth: [birth year]  Transition of Care [facility]) CM/SW Contact:  Vinie Sill, [place] Phone Number: 08/02/2019, 3:16 PM   Clinical Narrative:     CSW confirmed SNF offer with [facility].Covid test pending. Covid must be received before patient is discharged to SNF.    [name], MSW, LCSWA Clinical Social Worker [phone]     Barriers to Discharge: Insurance Authorization, Continued Medical Work up   Patient Goals and CMS Choice Patient states their goals for this hospitalization and ongoing recovery are:: "I want to be at home and nt in the hospital." CMS Medicare.gov Compare Post Acute Care list provided to:: Patient Represenative (must comment) Choice offered to / list presented to : Adult Children  Discharge Placement                       Discharge Plan and Services     Post Acute Care Choice: Skilled Nursing Facility                               Social Determinants of Health (SDOH) Interventions     Readmission Risk Interventions No flowsheet data found.

## 2019-08-02 NOTE — Progress Notes (Signed)
PHARMACY NOTE:  ANTIMICROBIAL RENAL DOSAGE ADJUSTMENT  Current antimicrobial regimen includes a mismatch between antimicrobial dosage and estimated renal function.  As per policy approved by the Pharmacy & Therapeutics and Medical Executive Committees, the antimicrobial dosage will be adjusted accordingly.  Current antimicrobial dosage:  Keflex 250mg  po q6h for 3 days  Indication: UTI  Renal Function:  Estimated Creatinine Clearance: 25.6 mL/min (A) (by C-G formula based on SCr of 1.42 mg/dL (H)).     Antimicrobial dosage has been changed to:  Keflex 250mg  po q8h for 3 days  Additional comments:   Thank you for allowing pharmacy to be a part of this patient's care.  Hildred Laser, PharmD Clinical Pharmacist **Pharmacist phone directory can now be found on [email or web address] ([email or web address]).  Listed under [place].

## 2019-08-02 NOTE — Discharge Summary (Signed)
Nicole Barker  Patient name: Nicole Barker Medical record number: [id number] Date of birth: [birth year] Age: 85 y.o. Gender: female Date of Admission: 07/29/2019  Date of Discharge: 08/03/19  Admitting Physician: Zenia Resides, MD  Primary Care Provider: Merlene Laughter, MD Consultants: Cardiology, palliative  Indication for Hospitalization: dyspnea and frequent falls  Discharge Diagnoses/Problem List:  Acute on chronic HFrEF  Asthma/ COPD Paroxysmal A. fib  Dementia Thrombocytopenia 80,000 colonies E. Coli on urine culture Hyperbilirubinemia Hypertension Hyperlipidemia CKD 3 Chronic pain Moderate protein-calorie Malnutrition  Disposition: SNF  Discharge Condition: stable  Discharge Exam:  General: NAD, pleasant Cardiovascular: Irregular rhythm, regular rate, no m/r/g, trace LE edema Respiratory: CTA BL, normal work of breathing MSK: moves 4 extremities equally Derm: no rashes appreciated Neuro: CN II-XII grossly intact Psych: AO to self only, appropriate affect  Brief Hospital Course:  Nicole Barker is an 85 year old female who was admitted secondary to worsening dyspnea, swelling, slurred speech and advancing dementia with frequent falls.  In the ED patient was initially requiring 3 L nasal cannula and was noted to be A. fib with RVR.  Chest x-ray showed cardiomegaly with vascular congestion and bilateral pleural effusions.  BNP elevated to 1882.  Patient initially started on IV Lasix 40 mg which led to good diuresis.  Cardiology was consulted given her A. fib with RVR in setting of HFmrEF.  Patient was previously admitted with a similar picture and was to be discharged on amiodarone however she did not receive this outpatient.  Cardiology initiated amiodarone gtt and eventually she was transitioned to p.o. amiodarone which she is to receive 200 mg twice daily for 2 weeks and then will decrease to 200 mg daily  for maintenance dose.  Her heart rate was still elevated periodically throughout this day and metoprolol 12.5 mg twice daily was also initiated for better rate control.  In the ED patient was noted to have a UA with signs of infection.  Urine culture grew 80,000 colonies of E. coli.  Given patient with worsening dementia was difficult to ascertain whether or not she was symptomatic with this.  Patient was treated with ceftriaxone x2 days and then was transitioned to p.o. Keflex for 3 more days of antibiotics.  Given patient's worsening dementia and frequent falls as well as decreased p.o. intake secondary to her worsening dementia, SLP, PT, and OT were consulted during stay.  Palliative was also consulted given her worsening state.  Daughter, [name] who is patient's caregiver would like for patient to receive full scope medical care but is also leaning toward transitioning toward comfort care giving her mom's worsening mental status.  Patient deemed eligible for SNF placement.  Patient was to be discharged to [facility] care and will need palliative follow-up outpatient.  Patient's statin was discontinued during admission.   Throughout admission patient remained stable and was able to be discharged on oral amiodarone, metoprolol and oral diuretics.  Patient was stable on room air.  She did well with a mechanical soft diet per our SLP but required being fed given her worsening dementia.   Issues for Follow Up:  1. Patient will need palliative care follow-up. 2. We discontinued patient's statin in light of worsening dementia. 3. for now would continue apixaban 2.5 mg bid, but consider stopping if family elects full comfort care.  4. Started on po amiodarone 200mg  BID x2weeks, then decrease to 200mg  daily, as well as metoprolol 12.5 mg BID. 5. Patient treated for E.  coli UTI while admitted with ceftriaxone x2 days.  She will complete a 5-day course of antibiotics.  Keflex qid for 2 more days after  discharge ending on 8/15  Significant Procedures: none  Significant Labs and Imaging:  Recent Labs  Lab 07/30/19 1031 07/31/19 0625 08/01/19 1144  WBC 11.6* 11.2* 10.9*  HGB 13.6 12.4 13.4  HCT 42.5 39.5 41.2  PLT 88* 75* 70*   Recent Labs  Lab 07/29/19 1023 07/30/19 0536 07/31/19 0625 08/01/19 0402 08/01/19 1144 08/02/19 0449 08/03/19 0651  NA 138 139 139 140  --  140 138  K 4.1 4.8 3.5 4.1  --  4.2 4.7  CL 97* 101 98 98  --  96* 95*  CO2 24 22 27 29   --  30 28  GLUCOSE 104* 89 95 90  --  94 107*  BUN 38* 47* 51* 50*  --  45* 47*  CREATININE 1.62* 1.47* 1.63* 1.68*  --  1.42* 1.59*  CALCIUM 9.2 8.6* 8.5* 8.5*  --  8.9 8.8*  ALKPHOS 106  --   --   --  223*  --   --   AST 62*  --   --   --  67*  --   --   ALT 39  --   --   --  40  --   --   ALBUMIN 2.9*  --   --   --  2.6*  --   --     Dg Thoracic Spine W/swimmers Result Date: 07/29/2019 IMPRESSION: No thoracic spine fracture or subluxation. Moderate thoracic spondylosis.   Dg Lumbar Spine Complete Result Date: 07/29/2019 IMPRESSION: 1. Acute anterior inferior L4 vertebral corner fracture. 2. Anterolisthesis at L3-4 measuring 7 mm. 3. Severe multilevel lumbar degenerative disc disease.   Ct Head Wo Contrast Result Date: 07/29/2019 IMPRESSION: 1. No acute intracranial abnormality. Atrophy, chronic ischemic microangiopathy and intracranial atherosclerosis. 2. No fracture or static subluxation of the cervical spine.   Ct Cervical Spine Wo Contrast Result Date: 07/29/2019 IMPRESSION: 1. No acute intracranial abnormality. Atrophy, chronic ischemic microangiopathy and intracranial atherosclerosis. 2. No fracture or static subluxation of the cervical spine.   Dg Chest Portable 1 View Result Date: 07/29/2019 IMPRESSION: Cardiomegaly, mild pulmonary vascular congestion with mild bibasilar atelectasis/airspace disease. Question very small bilateral pleural effusions.   Dg Shoulder Left Result Date: 07/29/2019 IMPRESSION: No  fracture or dislocation in the left shoulder. High-riding left humeral head suggests complete rotator cuff tear or CPPD arthropathy. Moderate left acromioclavicular and glenohumeral joint osteoarthritis.   US Abdomen Limited Ruq Result Date: 07/29/2019 IMPRESSION: 1. Status post cholecystectomy. 2. No acute sonographic abnormality. 3. Again noted is a right-sided pleural effusion.    Results/Tests Pending at Time of Discharge: none  Discharge Medications:  Allergies as of 08/03/2019      Reactions   Iodine Other (See Comments)   Unknown reaction      Medication List    STOP taking these medications   atorvastatin 10 MG tablet Commonly known as: LIPITOR   busPIRone 5 MG tablet Commonly known as: BUSPAR   CENTRUM SILVER ADULT 50+ PO   doxycycline 100 MG tablet Commonly known as: ADOXA   fluticasone 50 MCG/ACT nasal spray Commonly known as: FLONASE   furosemide 40 MG tablet Commonly known as: LASIX   levofloxacin 750 MG tablet Commonly known as: LEVAQUIN     TAKE these medications   acetaminophen 325 MG tablet Commonly known as: TYLENOL Take 2 tablets (650 mg  total) by mouth every 6 (six) hours as needed for mild pain or headache.   albuterol (2.5 MG/3ML) 0.083% nebulizer solution Commonly known as: PROVENTIL Take 2.5 mg by nebulization every 8 (eight) hours as needed for wheezing or shortness of breath.   amiodarone 200 MG tablet Commonly known as: PACERONE Take 1 tablet (200 mg total) by mouth 2 (two) times daily. 200mg  BID x2weeks (until 8/26), then decrease to 200mg  daily   apixaban 2.5 MG Tabs tablet Commonly known as: ELIQUIS Take 1 tablet (2.5 mg total) by mouth 2 (two) times daily.   Breo Ellipta 100-25 MCG/INH Aepb Generic drug: fluticasone furoate-vilanterol Inhale 2 puffs into the lungs daily.   cephALEXin 250 MG capsule Commonly known as: KEFLEX Take 1 capsule (250 mg total) by mouth every 8 (eight) hours for 2 days.   divalproex 250 MG DR  tablet Commonly known as: DEPAKOTE Take 250 mg by mouth 2 (two) times daily.   escitalopram 10 MG tablet Commonly known as: LEXAPRO Take 10 mg by mouth daily.   feeding supplement (ENSURE ENLIVE) Liqd Take 237 mLs by mouth 2 (two) times daily between meals.   gabapentin 100 MG capsule Commonly known as: NEURONTIN Take 100 mg by mouth 2 (two) times daily.   Melatonin 5 MG Tabs Take 5 mg by mouth at bedtime.   metoprolol tartrate 25 MG tablet Commonly known as: LOPRESSOR Take 0.5 tablets (12.5 mg total) by mouth 2 (two) times daily.   polyethylene glycol 17 g packet Commonly known as: MIRALAX / GLYCOLAX Take 17 g by mouth daily.   senna-docusate 8.6-50 MG tablet Commonly known as: Senokot-S Take 1 tablet by mouth at bedtime as needed for mild constipation.   torsemide 20 MG tablet Commonly known as: DEMADEX Take 1 tablet (20 mg total) by mouth daily. Start taking on: August 04, 2019            Durable Medical Equipment  (From admission, onward)         Start     Ordered   07/31/19 1603  For home use only DME 3 n 1  Once     07/31/19 1602          Discharge Instructions: Please refer to Patient Instructions section of EMR for full details.  Patient was counseled important signs and symptoms that should prompt return to medical care, changes in medications, dietary instructions, activity restrictions, and follow up appointments.   Follow-Up Appointments:   Abrianna Sidman, [place], DO 08/03/2019, 1:39 PM PGY-3, [place]

## 2019-08-03 LAB — BASIC METABOLIC PANEL
Anion gap: 15 (ref 5–15)
BUN: 47 mg/dL — ABNORMAL HIGH (ref 8–23)
CO2: 28 mmol/L (ref 22–32)
Calcium: 8.8 mg/dL — ABNORMAL LOW (ref 8.9–10.3)
Chloride: 95 mmol/L — ABNORMAL LOW (ref 98–111)
Creatinine, Ser: 1.59 mg/dL — ABNORMAL HIGH (ref 0.44–1.00)
GFR calc Af Amer: 33 mL/min — ABNORMAL LOW (ref >60)
GFR calc non Af Amer: 29 mL/min — ABNORMAL LOW (ref >60)
Glucose, Bld: 107 mg/dL — ABNORMAL HIGH (ref 70–99)
Potassium: 4.7 mmol/L (ref 3.5–5.1)
Sodium: 138 mmol/L (ref 135–145)

## 2019-08-03 MED ORDER — TORSEMIDE 20 MG PO TABS: 20.0000 mg | ORAL_TABLET | Freq: Every day | ORAL | Status: AC

## 2019-08-03 MED ORDER — ENSURE ENLIVE PO LIQD: 237.0000 mL | Freq: Two times a day (BID) | ORAL | 12 refills | Status: AC

## 2019-08-03 MED ORDER — CEPHALEXIN 250 MG PO CAPS: 250.0000 mg | ORAL_CAPSULE | Freq: Three times a day (TID) | ORAL | 0 refills | Status: AC

## 2019-08-03 MED ORDER — AMIODARONE HCL 200 MG PO TABS: 200.0000 mg | ORAL_TABLET | Freq: Two times a day (BID) | ORAL | Status: AC

## 2019-08-03 MED ORDER — ENSURE ENLIVE PO LIQD
237.0000 mL | Freq: Two times a day (BID) | ORAL | Status: DC
  Administered 2019-08-03 (×2): 237 mL via ORAL

## 2019-08-03 MED ORDER — METOPROLOL TARTRATE 25 MG PO TABS: 12.5000 mg | ORAL_TABLET | Freq: Two times a day (BID) | ORAL | Status: AC

## 2019-08-03 NOTE — Progress Notes (Signed)
Pt to [facility] via stretcher and PTAR, pt stable, dtr [name] at bedside at transfer Isla Pence, called [facility] to give report, spoke to [place] , all questions entertatined and answered

## 2019-08-03 NOTE — Progress Notes (Signed)
Physical Therapy Treatment Patient Details Name: Nicole Barker MRN: [id number] DOB: December 26, 1929 Today's Date: 08/03/2019    History of Present Illness Patient is an 85 y/o female with PMH of heart failure, renal insufficiency, hypertension, hyperlipidemia, paroxysmal atrial fibrillation and dementia.  She presents s/p fall at her facility (memory care @ [facility]) with several other falls in the past week one with injury with imaging of L spine positive for Acute anterior inferior L4 vertebral corner fracture.  She was found have CHF exacerbation with elevated BNP and hypoxia requiring O2.    PT Comments    Pt supine on arrival with bil mittens. Pt with increased ability with bed transfer today but widely varied assist min-mod assist needed to stand over multiple trials this session. Pt unable to state need to void but when placed on BSC did have BM with max assist for pericare. Pt with slow progression and able to perform limited ambulation. Will continue to follow.     Follow Up Recommendations  SNF;Supervision/Assistance - 24 hour     Equipment Recommendations  None recommended by PT    Recommendations for Other Services       Precautions / Restrictions Precautions Precautions: Fall    Mobility  Bed Mobility Overal bed mobility: Needs Assistance Bed Mobility: Supine to Sit Rolling: Min assist Sidelying to sit: Min assist       General bed mobility comments: cues for sequence with assist to roll and elevate trunk from surface with HOB 20 and increased time  Transfers Overall transfer level: Needs assistance   Transfers: Sit to/from Stand;Stand Pivot Transfers Sit to Stand: Mod assist Stand pivot transfers: Mod assist;+2 safety/equipment       General transfer comment: bed height elevated with RW present pt able to rise from surface after 2 attempts from bed with mod assist. Pt with RW present to pivot bed to [facility] with mod assist +2 as pt gets stuck with  movement and needs assist to pivot pelvis to chair.  Rise from [facility] with min assist x 3 trials with switching BSC for recliner on last trial. Pt then stood from recliner with min assist for limited gait.  Ambulation/Gait Ambulation/Gait assistance: Min assist Gait Distance (Feet): 8 Feet Assistive device: Rolling walker (2 wheeled) Gait Pattern/deviations: Step-through pattern;Decreased stride length;Trunk flexed;Shuffle   Gait velocity interpretation: <1.8 ft/sec, indicate of risk for recurrent falls General Gait Details: cues for posture and safety with close chair follow, min assist to advance RW and control balance. pt fatigues quickly   Stairs             Wheelchair Mobility    Modified Rankin (Stroke Patients Only)       Balance Overall balance assessment: Needs assistance Sitting-balance support: Bilateral upper extremity supported Sitting balance-Leahy Scale: Fair Sitting balance - Comments: minguard EOB and on BSC   Standing balance support: Bilateral upper extremity supported Standing balance-Leahy Scale: Poor Standing balance comment: reliant on bil UE support in standing with physical assist                            Cognition Arousal/Alertness: Awake/alert Behavior During Therapy: Flat affect Overall Cognitive Status: No family/caregiver present to determine baseline cognitive functioning                                 General Comments: pt oriented to self only, able  to follow 1 step commands with increased time       Exercises      General Comments        Pertinent Vitals/Pain Pain Assessment: (PAIN AD= 0)    Home Living                      Prior Function            PT Goals (current goals can now be found in the care plan section) Progress towards PT goals: Progressing toward goals    Frequency    Min 2X/week      PT Plan Current plan remains appropriate    Co-evaluation               AM-PAC PT "6 Clicks" Mobility   Outcome Measure  Help needed turning from your back to your side while in a flat bed without using bedrails?: A Lot Help needed moving from lying on your back to sitting on the side of a flat bed without using bedrails?: A Lot Help needed moving to and from a bed to a chair (including a wheelchair)?: A Lot Help needed standing up from a chair using your arms (e.g., wheelchair or bedside chair)?: A Lot Help needed to walk in hospital room?: A Lot Help needed climbing 3-5 steps with a railing? : Total 6 Click Score: 11    End of Session Equipment Utilized During Treatment: Gait belt Activity Tolerance: Patient tolerated treatment well Patient left: in chair;with call bell/phone within reach;with chair alarm set;with restraints reapplied(bil mittens donned) Nurse Communication: Mobility status PT Visit Diagnosis: Muscle weakness (generalized) (M62.81);Other abnormalities of gait and mobility (R26.89);History of falling (Z91.81)     Time: [id number] PT Time Calculation (min) (ACUTE ONLY): 23 min  Charges:  $Gait Training: 8-22 mins $Therapeutic Activity: 8-22 mins                     [place], PT Acute Rehabilitation Services Pager: [phone] Office: [place] 08/03/2019, 1:15 PM

## 2019-08-03 NOTE — TOC Transition Note (Signed)
Transition of Care [facility]) - CM/SW Discharge Note   Patient Details  Name: Nicole Barker MRN: [id number] Date of Birth: May 29, 1930  [facility]) CM/SW Contact:  Zenon Mayo, RN Phone Number: 08/03/2019, 1:42 PM   Clinical Narrative:    Patient is for dc today to [facility] , [place] spoke with Juliann Pulse , patient will be going to room 112 and RN to call report to [phone].  Juliann Pulse at [facility]  States they would like patient to come after 3 pm.  NCM set up transport for 3 pm via PTAR.   Final next level of care: Skilled Nursing Facility Barriers to Discharge: No Barriers Identified   Patient Goals and CMS Choice Patient states their goals for this hospitalization and ongoing recovery are:: SNF CMS Medicare.gov Compare Post Acute Care list provided to:: Patient Represenative (must comment) Choice offered to / list presented to : NA  Discharge Placement              Patient chooses bed at: [facility] Patient to be transferred to facility by: ptar Name of family member notified: [name] Patient and family notified of of transfer: 08/03/19  Discharge Plan and Services     Post Acute Care Choice: [place]          DME Arranged: (NA)         HH Arranged: NA          Social Determinants of Health (SDOH) Interventions     Readmission Risk Interventions No flowsheet data found.

## 2019-08-03 NOTE — Progress Notes (Addendum)
Family Medicine Teaching Service Daily Progress Note Intern Pager: [phone]  Patient name: Nicole Barker Medical record number: [id number] Date of birth: 1930/09/02 Age: 85 y.o. Gender: female  Primary Care Provider: Merlene Laughter, MD Consultants: Cards, Palliative  Code Status: DNR   Pt Overview and Major Events to Date:  Hospital Day: 6 07/29/2019: admitted for Dyspnea   Assessment and Plan: Nicole Barker is a 85 y.o. female who presented from ALF w/ dyspnea and noted to be less responsive, eating and drinking less.  Her PMH is significant for HFrEF, paroxysmal A. fib on anticoagulation, dementia, HTN, HLD, CKD, stage III.    #Acute on chronic HFmrEF (EF 45-50%) #Asthma #COPD Stable. Patient stable for discharge, COVID NEG. Patient is breathing comfortably and sitting up in bed.  Weight on admission 168 lbs and now 177 lbs, but unsure if these are accurate.  Better UOP and now on RA.   Oxygen as needed >90%  Will switch to oral torsemide 20mg  daily   #Paroxysmal A. fib  Patient in A. fib with RVR. Patient was previously to be discharged on 200 mg daily of amiodarone, unclear why this was discontinued.  Amiodarone restarted on 07/30/2019.  Cardiology following, appreciate recommendations  Will transition to po amiodarone 200mg  BID x2weeks, then decrease to 200mg  daily  Continue metoprolol 12.5 mg BID  #Dementia Has increased need of care.  4 falls in the last 7 days with no acute abnormalities on imaging.  Patient speech and cognitive function also declining progressively. Per daughter, she wants to keep patient comfortable and does not want to pursue aggressive medical care.  Continue Depakote  Delirium precautions, Avoid sedation  Palliative consult  CMN CSW consulted  Soft diet with aspiration precautions, please feed patient  PT/OT/SLP  #Thrombocytopenia Plt 167 on 08/05; 70 on 08/11. Has not had heparin or other VTE prophylaxis  Continue to  monitor  Pathologist review shows thrombocytopenia  patient stable, no signs of bleeding  #80,000 colonies E. Coli on urine culture Patient unable to verbalize any symptoms.  No signs of infection but difficult to discern with patient's baseline dementia.  Susceptible to CTX (08/10-11), will start keflex to continue treatment for 5 days total, patient to receive 3 more days  #Hypertension Stable, not on any antihypertensives at home.    Monitor    #Hyperlipidemia  Discontinue atorvastatin given dementia and GOC  #CKD 3 Cr 1.59 this morning which is baseline   Trend BMP / urinary output Replace electrolytes as indicated Avoid nephrotoxic agents, ensure adequate renal perfusion  #Chronic pain  Continue home acetominophen sched and 100 mg gabapentin BID    Will add tramadol 25mg  q12hrn for breakthrough pain, try to avoid given dementia and may worsen delirium  #Moderate protein-calorie Malnutrition Severe dementia, worsening  Palliative following  Nutrition consult  #FEN/GI:  . Fluids: none . Nutrition: dysphagia 3, mechanical soft   Access: Purewick (07/29/19) VTE prophylaxis: Chronically Anticoagulated - Apixaban 2.5mg  BID   Disposition: stable for discharge  Subjective:  Patient sitting up in chair and states that she is tired.  She has mittens on.  Nurse states that throughout the night she was becoming more agitated.  Objective: Temp:  [98 F (36.7 C)-98.4 F (36.9 C)] 98 F (36.7 C) (08/13 0514) Pulse Rate:  [65-98] 65 (08/13 0514) Cardiac Rhythm: Atrial fibrillation (08/13 0704) Resp:  [16-18] 18 (08/12 1941) BP: (123-132)/(81-106) 123/106 (08/13 0514) SpO2:  [96 %-99 %] 96 % (08/13 0858) Weight:  [80.5 kg] 80.5 kg (  08/13 0514) Intake/Output      08/12 0701 - 08/13 0700 08/13 0701 - 08/14 0700   P.O. 860    I.V. (mL/kg)     IV Piggyback     Total Intake(mL/kg) 860 (10.7)    Urine (mL/kg/hr) 353 (0.2)    Stool 3    Total Output 356    Net +504          Stool Occurrence 2 x        Physical Exam: General: NAD, pleasant Cardiovascular: Irregular rhythm, regular rate, no m/r/g, trace LE edema Respiratory: CTA BL, normal work of breathing MSK: moves 4 extremities equally Derm: no rashes appreciated Neuro: CN II-XII grossly intact Psych: AO to self only, appropriate affect  Laboratory: I have personally read and reviewed all labs and imaging studies.  CBC: Recent Labs  Lab 07/29/19 1023 07/30/19 1031 07/31/19 0625 08/01/19 1144  WBC 15.9* 11.6* 11.2* 10.9*  NEUTROABS 13.1* 9.0*  --   --   HGB 13.8 13.6 12.4 13.4  HCT 43.9 42.5 39.5 41.2  MCV 91.5 90.2 90.6 90.7  PLT PLATELET CLUMPS NOTED ON SMEAR, COUNT APPEARS DECREASED 88* 75* 70*   CMP: Recent Labs  Lab 07/29/19 1023  08/01/19 0402 08/01/19 1144 08/02/19 0449 08/03/19 0651  NA 138   < > 140  --  140 138  K 4.1   < > 4.1  --  4.2 4.7  CL 97*   < > 98  --  96* 95*  CO2 24   < > 29  --  30 28  GLUCOSE 104*   < > 90  --  94 107*  BUN 38*   < > 50*  --  45* 47*  CREATININE 1.62*   < > 1.68*  --  1.42* 1.59*  CALCIUM 9.2   < > 8.5*  --  8.9 8.8*  ALBUMIN 2.9*  --   --  2.6*  --   --    < > = values in this interval not displayed.     Micro: Covid Negative 8/8 Urine Culture   Imaging/Diagnostic Tests: Dg Thoracic Spine W/swimmers Result Date: 07/29/2019 IMPRESSION: No thoracic spine fracture or subluxation. Moderate thoracic spondylosis.   Dg Lumbar Spine Complete Result Date: 07/29/2019 IMPRESSION: 1. Acute anterior inferior L4 vertebral corner fracture. 2. Anterolisthesis at L3-4 measuring 7 mm. 3. Severe multilevel lumbar degenerative disc disease.   Ct Head Wo Contrast Result Date: 07/29/2019 IMPRESSION: 1. No acute intracranial abnormality. Atrophy, chronic ischemic microangiopathy and intracranial atherosclerosis. 2. No fracture or static subluxation of the cervical spine.   Ct Cervical Spine Wo Contrast Result Date: 07/29/2019 IMPRESSION: 1. No acute  intracranial abnormality. Atrophy, chronic ischemic microangiopathy and intracranial atherosclerosis. 2. No fracture or static subluxation of the cervical spine.   Dg Chest Portable 1 View Result Date: 07/29/2019 IMPRESSION: Cardiomegaly, mild pulmonary vascular congestion with mild bibasilar atelectasis/airspace disease. Question very small bilateral pleural effusions.   Dg Shoulder Left Result Date: 07/29/2019 IMPRESSION: No fracture or dislocation in the left shoulder. High-riding left humeral head suggests complete rotator cuff tear or CPPD arthropathy. Moderate left acromioclavicular and glenohumeral joint osteoarthritis.   US Abdomen Limited Ruq Result Date: 07/29/2019 IMPRESSION: 1. Status post cholecystectomy. 2. No acute sonographic abnormality. 3. Again noted is a right-sided pleural effusion.    Cadyn Fann, [place], DO 08/03/2019, 9:11 AM PGY-3, [place] Intern pager: [phone], text pages welcome

## 2019-09-21 DEATH — deceased

## 2019-11-01 IMAGING — DX DG CHEST 1V PORT
1 series · 1 of 1 positions shown · non-contrast
Comparison: 10/30/2018

CLINICAL DATA: Shortness of breath

EXAM:
PORTABLE CHEST 1 VIEW

[chest ap]
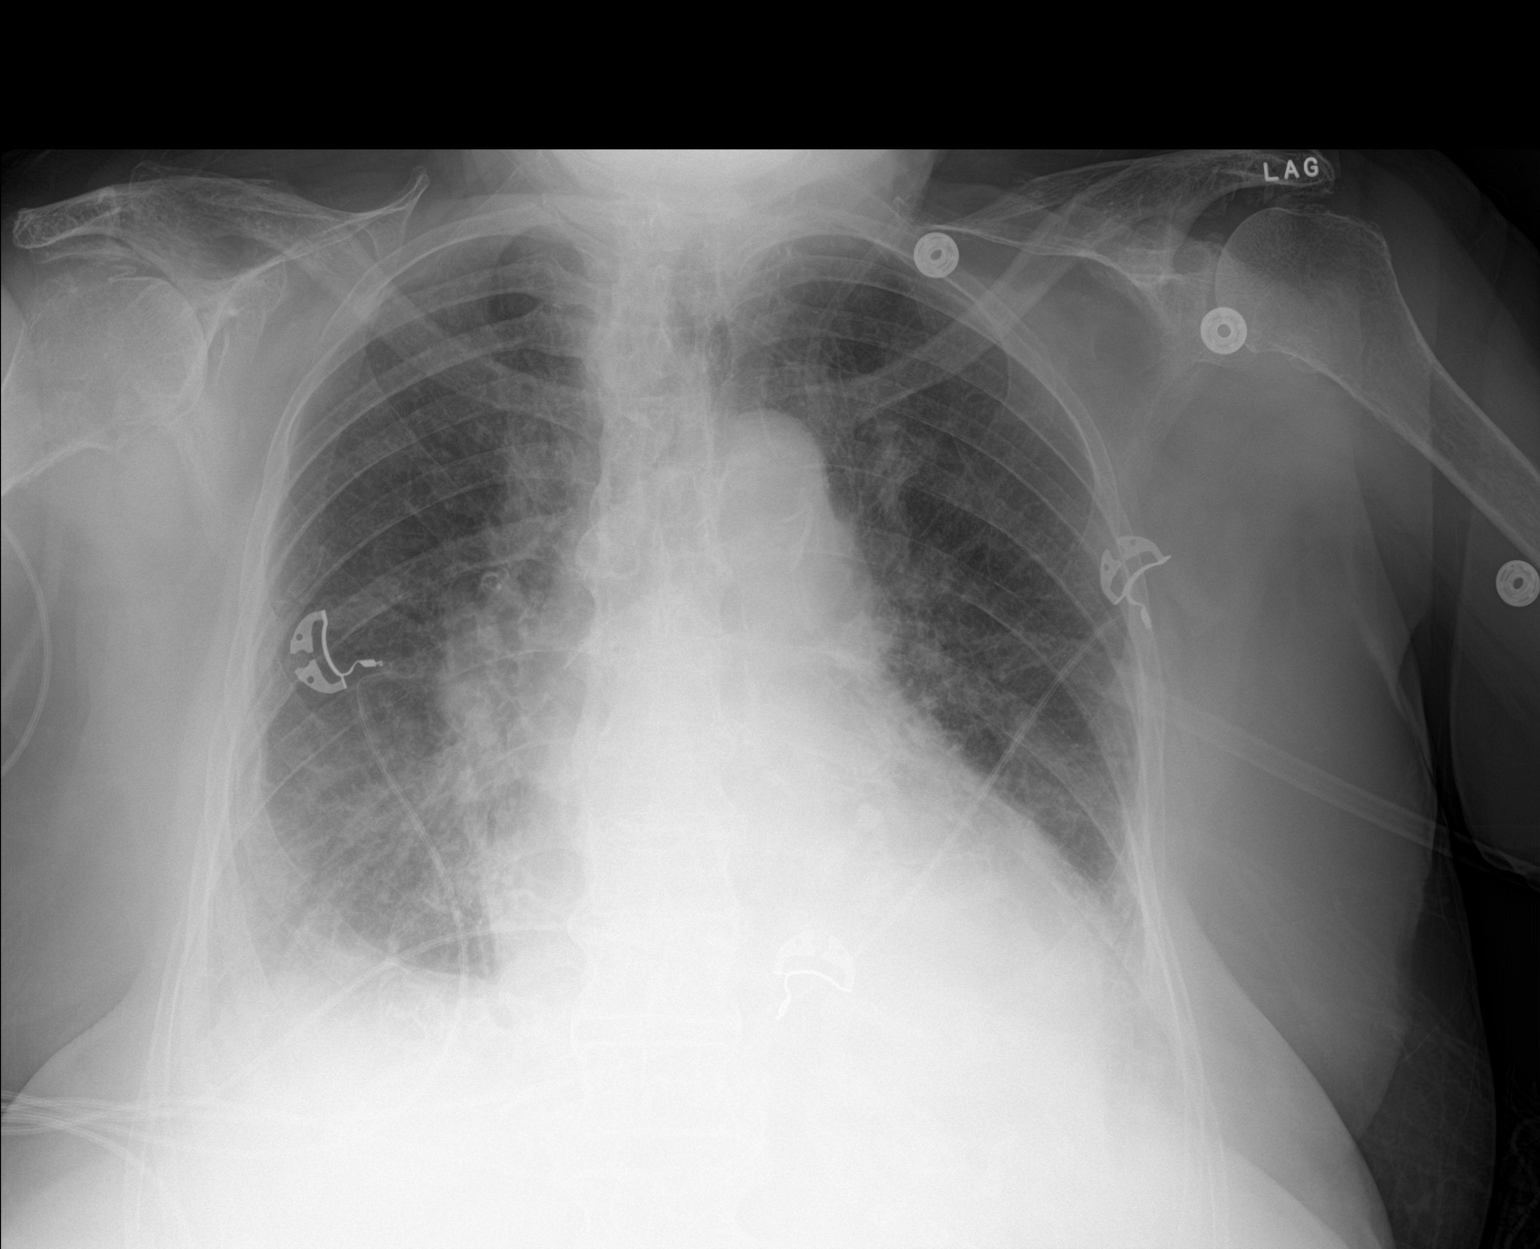

[1 of 1 positions shown; findings below may reference images not displayed]

FINDINGS: Cardiac shadow remains enlarged. Aortic calcifications are again
seen. Small effusions are again identified with some minimal basilar
atelectasis. No significant central pulmonary edema is noted. Old
rib fractures are noted on the right.
IMPRESSION: Mild bibasilar atelectasis and effusions stable from the previous
exam.

## 2019-11-02 IMAGING — CT CT ANGIO CHEST
2 of 7 series · 18 of 46 positions shown · IV contrast (APPLIED)
Comparison: Same day CXR

CLINICAL DATA: Worsening dyspnea.  History of Sjogren syndrome.

EXAM:
CT ANGIOGRAPHY CHEST WITH CONTRAST
TECHNIQUE: Multidetector CT imaging of the chest was performed using the
standard protocol during bolus administration of intravenous
contrast. Multiplanar CT image reconstructions and MIPs were
obtained to evaluate the vascular anatomy.
CONTRAST:  42mL 1CMST2-YIT IOPAMIDOL (1CMST2-YIT) INJECTION 76%

[Series 7: thins · axial · 0.57mm/px · z∈[+1076,+1317]mm · 15 of 388 slices shown]
[im 22/388  lung]
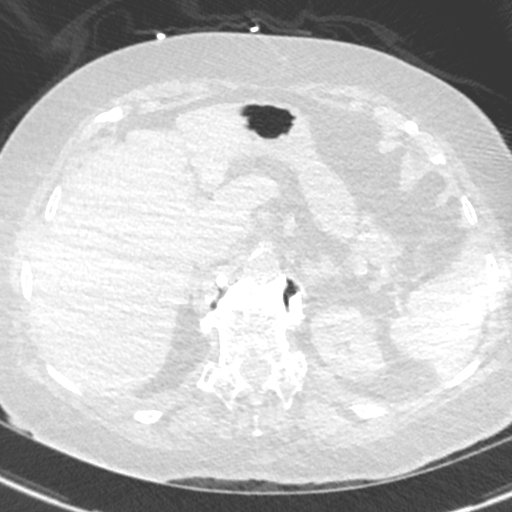
[im 44/388  soft-tissue]
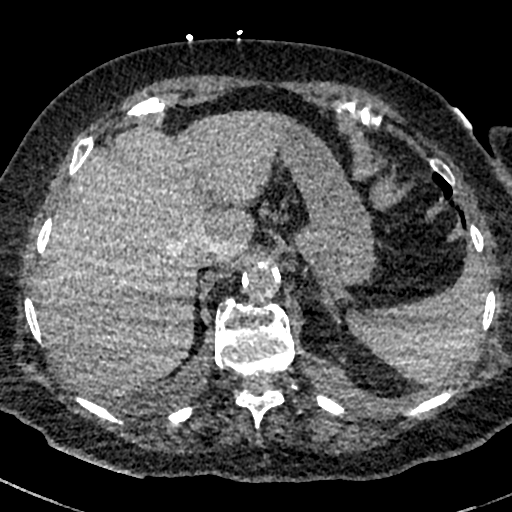
[im 65/388  lung]
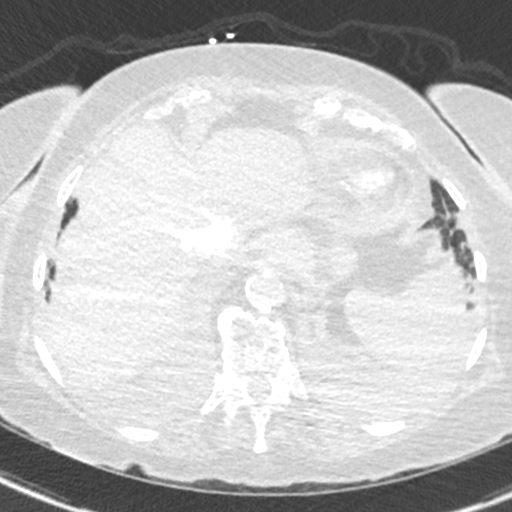
[im 87/388  soft-tissue]
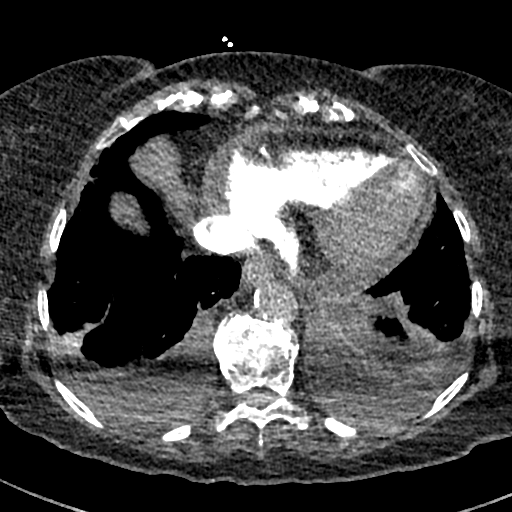
[im 130/388  lung]
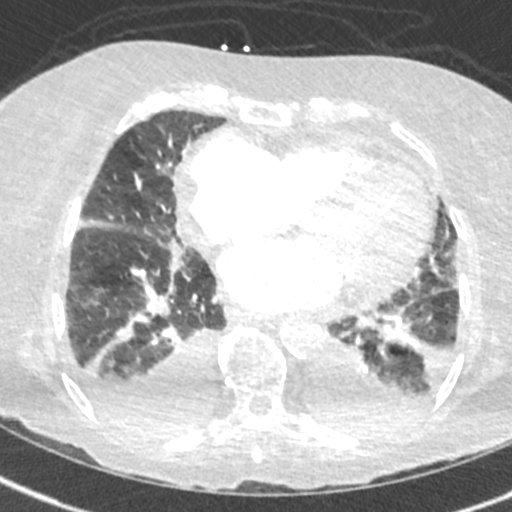
[im 151/388  soft-tissue]
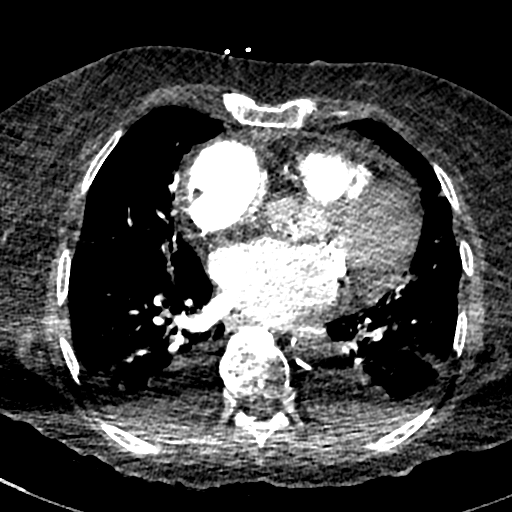
[im 173/388  lung]
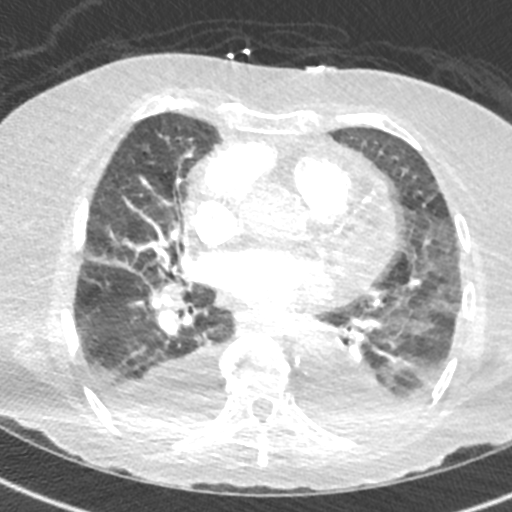
[im 194/388  soft-tissue]
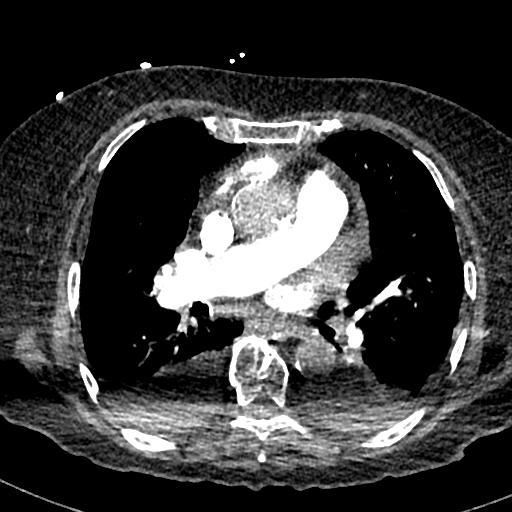
[im 216/388  lung]
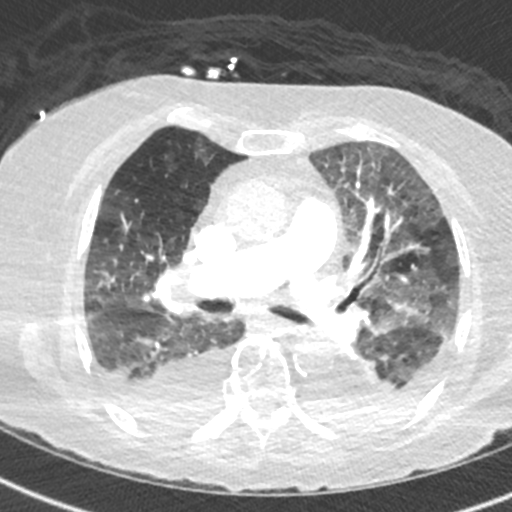
[im 237/388  soft-tissue]
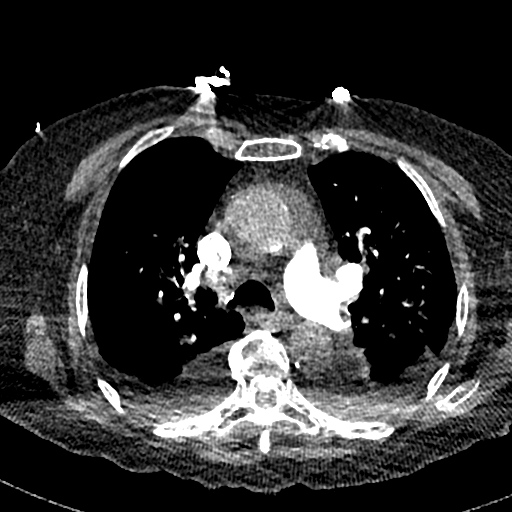
[im 259/388  lung]
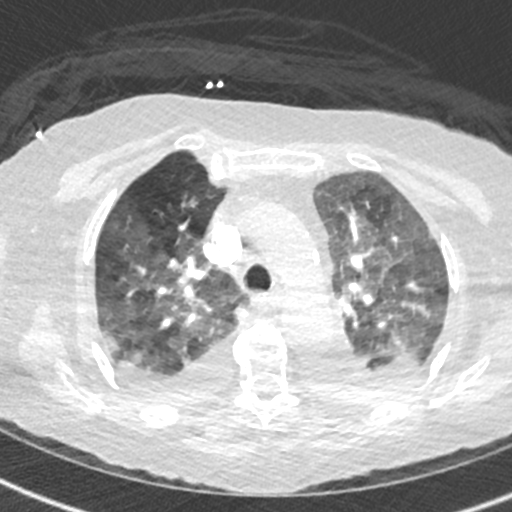
[im 302/388  soft-tissue]
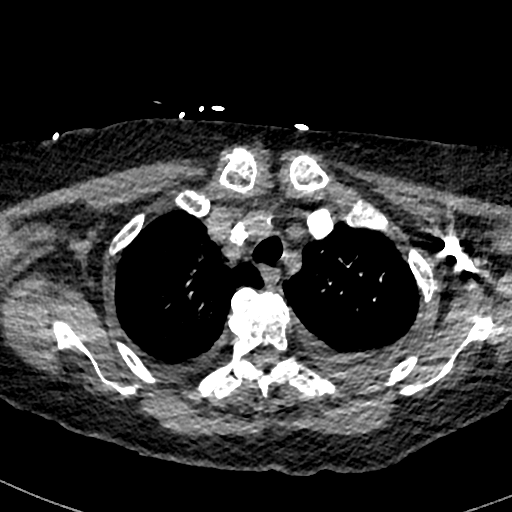
[im 323/388  lung]
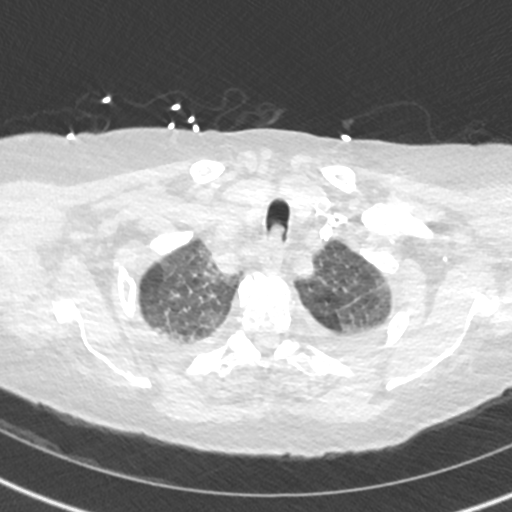
[im 345/388  soft-tissue]
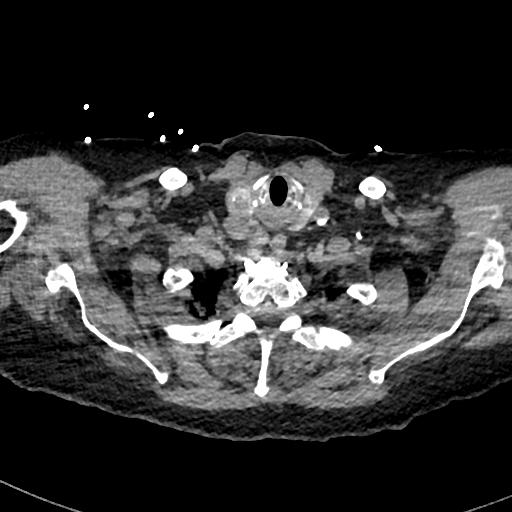
[im 366/388  lung]
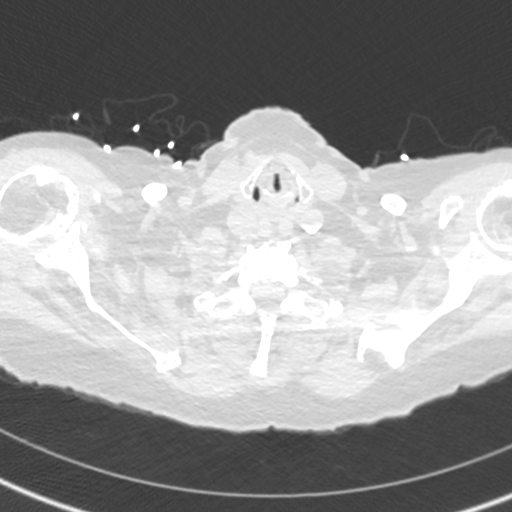

[Series 8: cor · coronal · 0.52mm/px · 3 of 126 slices shown]
[im 32/126  soft-tissue]
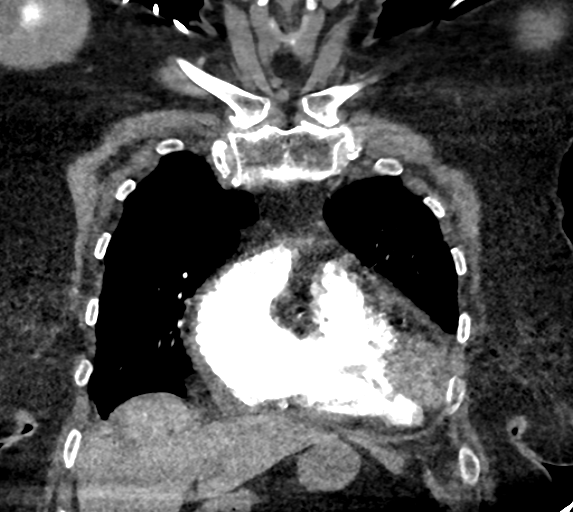
[im 63/126  soft-tissue]
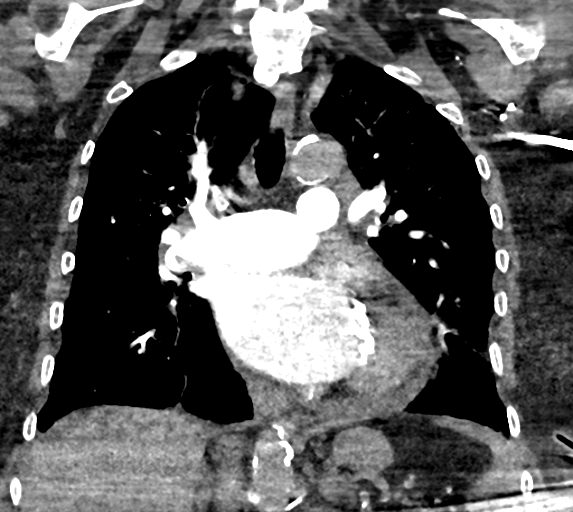
[im 94/126  soft-tissue]
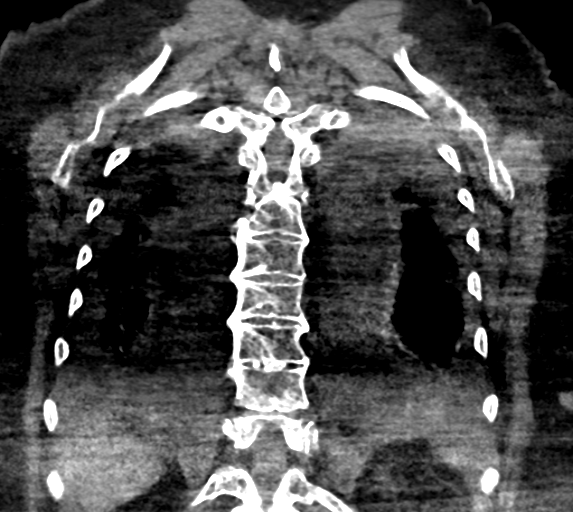

[18 of 46 positions shown; findings below may reference images not displayed]

FINDINGS: Cardiovascular: Conventional branch pattern of the great vessels
with atherosclerosis at the origins. Aortic atherosclerosis is noted
with ectasia of the ascending thoracic aorta to 3.4 cm. Preferential
opacification of the pulmonary arteries to the segmental level
without acute pulmonary embolus. Cardiomegaly with trace pericardial
effusion. Left main and three-vessel coronary arteriosclerosis is
noted.

Mediastinum/Nodes: Bilateral hypodense thyroid nodules, largest
approximately 1.5 cm on the right and 0.8 cm on left. No mediastinal
or hilar adenopathy. Patent midline trachea and mainstem bronchi.
Esophagus is unremarkable.

Lungs/Pleura: Small to moderate bilateral pleural effusions with
adjacent compressive atelectasis. Ground-glass opacities of the
lungs bilaterally compatible with stigmata of mild CHF.

Upper Abdomen: No acute abnormality.

Musculoskeletal: Diffuse idiopathic skeletal hyperostosis of the
dorsal spine with flowing osteophytes noted. No acute osseous
abnormality.

Review of the MIP images confirms the above findings.
IMPRESSION: 1. No acute pulmonary embolus.
2. Small to moderate bilateral pleural effusions with adjacent
compressive atelectasis. Ground-glass opacities of the visualized
lungs bilaterally compatible with stigmata of CHF.
3. Findings suggest multinodular goiter.

Aortic Atherosclerosis (ZO516-CW9.9).
